# Patient Record
Sex: Female | Born: 1950 | Race: White | Hispanic: No | Marital: Single | Smoking: Former smoker
Health system: Southern US, Community
[De-identification: ages and names within clinical notes are randomized; demographics above are authoritative.]

## PROBLEM LIST (undated history)

## (undated) DIAGNOSIS — F0390 Unspecified dementia without behavioral disturbance: Secondary | ICD-10-CM

## (undated) DIAGNOSIS — R32 Unspecified urinary incontinence: Secondary | ICD-10-CM

## (undated) DIAGNOSIS — E079 Disorder of thyroid, unspecified: Secondary | ICD-10-CM

## (undated) DIAGNOSIS — E785 Hyperlipidemia, unspecified: Secondary | ICD-10-CM

## (undated) HISTORY — PX: ABDOMINAL HYSTERECTOMY: SHX81

## (undated) HISTORY — DX: Disorder of thyroid, unspecified: E07.9

## (undated) HISTORY — DX: Unspecified urinary incontinence: R32

## (undated) HISTORY — DX: Unspecified dementia, unspecified severity, without behavioral disturbance, psychotic disturbance, mood disturbance, and anxiety: F03.90

## (undated) HISTORY — PX: DUPUYTREN CONTRACTURE RELEASE: SHX1478

## (undated) HISTORY — DX: Hyperlipidemia, unspecified: E78.5

---

## 2020-10-25 ENCOUNTER — Other Ambulatory Visit: Payer: Self-pay

## 2020-10-25 ENCOUNTER — Ambulatory Visit (INDEPENDENT_AMBULATORY_CARE_PROVIDER_SITE_OTHER): Payer: Medicare Other | Admitting: Family Medicine

## 2020-10-25 ENCOUNTER — Encounter: Payer: Self-pay | Admitting: Family Medicine

## 2020-10-25 VITALS — BP 130/66 | HR 93 | Temp 98.8°F | Ht 62.5 in | Wt 148.8 lb

## 2020-10-25 DIAGNOSIS — G309 Alzheimer's disease, unspecified: Secondary | ICD-10-CM

## 2020-10-25 DIAGNOSIS — F03918 Unspecified dementia, unspecified severity, with other behavioral disturbance: Secondary | ICD-10-CM | POA: Insufficient documentation

## 2020-10-25 DIAGNOSIS — F0390 Unspecified dementia without behavioral disturbance: Secondary | ICD-10-CM | POA: Insufficient documentation

## 2020-10-25 DIAGNOSIS — F0391 Unspecified dementia with behavioral disturbance: Secondary | ICD-10-CM | POA: Insufficient documentation

## 2020-10-25 DIAGNOSIS — E2839 Other primary ovarian failure: Secondary | ICD-10-CM

## 2020-10-25 DIAGNOSIS — Z1231 Encounter for screening mammogram for malignant neoplasm of breast: Secondary | ICD-10-CM | POA: Diagnosis not present

## 2020-10-25 DIAGNOSIS — N3281 Overactive bladder: Secondary | ICD-10-CM

## 2020-10-25 DIAGNOSIS — E538 Deficiency of other specified B group vitamins: Secondary | ICD-10-CM

## 2020-10-25 DIAGNOSIS — E785 Hyperlipidemia, unspecified: Secondary | ICD-10-CM

## 2020-10-25 DIAGNOSIS — E559 Vitamin D deficiency, unspecified: Secondary | ICD-10-CM

## 2020-10-25 DIAGNOSIS — F0280 Dementia in other diseases classified elsewhere without behavioral disturbance: Secondary | ICD-10-CM | POA: Diagnosis not present

## 2020-10-25 LAB — COMPREHENSIVE METABOLIC PANEL
ALT: 19 U/L (ref 0–35)
AST: 19 U/L (ref 0–37)
Albumin: 4 g/dL (ref 3.5–5.2)
Alkaline Phosphatase: 126 U/L — ABNORMAL HIGH (ref 39–117)
BUN: 10 mg/dL (ref 6–23)
CO2: 30 mEq/L (ref 19–32)
Calcium: 9.6 mg/dL (ref 8.4–10.5)
Chloride: 101 mEq/L (ref 96–112)
Creatinine, Ser: 0.86 mg/dL (ref 0.40–1.20)
GFR: 69.53 mL/min (ref >60.00)
Glucose, Bld: 107 mg/dL — ABNORMAL HIGH (ref 70–99)
Potassium: 3.6 mEq/L (ref 3.5–5.1)
Sodium: 139 mEq/L (ref 135–145)
Total Bilirubin: 0.5 mg/dL (ref 0.2–1.2)
Total Protein: 7.2 g/dL (ref 6.0–8.3)

## 2020-10-25 LAB — CBC WITH DIFFERENTIAL/PLATELET
Basophils Absolute: 0.1 10*3/uL (ref 0.0–0.1)
Basophils Relative: 0.6 % (ref 0.0–3.0)
Eosinophils Absolute: 0.2 10*3/uL (ref 0.0–0.7)
Eosinophils Relative: 2.3 % (ref 0.0–5.0)
HCT: 38.1 % (ref 36.0–46.0)
Hemoglobin: 13 g/dL (ref 12.0–15.0)
Lymphocytes Relative: 21.6 % (ref 12.0–46.0)
Lymphs Abs: 1.9 10*3/uL (ref 0.7–4.0)
MCHC: 34.2 g/dL (ref 30.0–36.0)
MCV: 89.8 fl (ref 78.0–100.0)
Monocytes Absolute: 0.6 10*3/uL (ref 0.1–1.0)
Monocytes Relative: 7 % (ref 3.0–12.0)
Neutro Abs: 6.1 10*3/uL (ref 1.4–7.7)
Neutrophils Relative %: 68.5 % (ref 43.0–77.0)
Platelets: 299 10*3/uL (ref 150.0–400.0)
RBC: 4.25 Mil/uL (ref 3.87–5.11)
RDW: 13.3 % (ref 11.5–15.5)
WBC: 8.9 10*3/uL (ref 4.0–10.5)

## 2020-10-25 LAB — LIPID PANEL
Cholesterol: 141 mg/dL (ref 0–200)
HDL: 42.9 mg/dL (ref >39.00)
LDL Cholesterol: 61 mg/dL (ref 0–99)
NonHDL: 97.68
Total CHOL/HDL Ratio: 3
Triglycerides: 185 mg/dL — ABNORMAL HIGH (ref 0.0–149.0)
VLDL: 37 mg/dL (ref 0.0–40.0)

## 2020-10-25 MED ORDER — TRIAMCINOLONE ACETONIDE 0.5 % EX OINT: 1.0000 "application " | TOPICAL_OINTMENT | Freq: Two times a day (BID) | CUTANEOUS | 0 refills | Status: DC

## 2020-10-25 NOTE — Patient Instructions (Addendum)
I will be in touch with you once I get previous records from [place] (from primary doc there)  *mammogram and bone density were ordered - you will get a call to set these up at  imaging.   *we can talk about sleep once I review records. Sleep can be disturbed with dementia as well as medications being higher risk. We can review options once I have more information.   *we can try triamcinolone for the rash - hopefully this will help take away itch. Let me know how this does. It is hard to tell what the rash is because everything is scratched off. Let me know if increased redness or soreness around areas of rash. Donna Patrick

## 2020-10-25 NOTE — Progress Notes (Signed)
Donna Patrick DOB: [birth year] Encounter date: 10/25/2020  This is a 70 y.o. female who presents to establish care. Chief Complaint  Patient presents with  . Establish Care  . Rash    Patient complains of itchy bumps on back and abdomen, time unknown, tried Hydrocortisone with some relief  . Insomnia    History of present illness: Moving from [place]. Has been there a long time and was living with boyfriend. Dementia was progressing and family moved her here. She is here with daughter today; son and daughter in law are my patients. Known medical history is pretty brief. Daughter here states that they may not have been updated on all med issues.  Frequent urination per daughter. Patient states it was not an issue. Was getting up 15-20 times/night and then started on medication (per son; patient declines). Patient denies incontinence.  Sleep pattern off per son: trouble going to sleep.   Was given benadryl for spots on her back/stomach - not sure if related to medications or not. Benadryl helped, but didn't make it go away. It is a lot better now.   Was seeing specialist in [place] (per daughter, but son states she just saw primary care), not sure name. Daughter thinks she has had memory issues for years but exact timeline is not known.  Last mammogram was not recently per son, DIL. They don't think she had a bone density but would like these things completed.  Rash on back - patient has been scratching with back scratcher and scratching self raw. Rash is better than it was a few weeks ago. Hydrocortisone helps momentarily with itch; not sure really helping. Patient also picks at one spot on face and at ear.    Past Medical History:  Diagnosis Date  . Dementia (HCC)    diagnosed 8-10 years ago  . Hyperlipidemia   . Incontinence   . Thyroid disease    Past Surgical History:  Procedure Laterality Date  . ABDOMINAL HYSTERECTOMY    . DUPUYTREN CONTRACTURE RELEASE Left    No Known  Allergies Current Meds  Medication Sig  . levothyroxine (SYNTHROID) 88 MCG tablet Take 88 mcg by mouth daily before breakfast.  . memantine (NAMENDA) 10 MG tablet Take 10 mg by mouth 2 (two) times daily.  Marland Kitchen oxybutynin (DITROPAN-XL) 10 MG 24 hr tablet Take 10 mg by mouth daily.  . simvastatin (ZOCOR) 40 MG tablet Take 40 mg by mouth daily.  Marland Kitchen triamcinolone ointment (KENALOG) 0.5 % Apply 1 application topically 2 (two) times daily.   Social History   Tobacco Use  . Smoking status: Former Games developer  . Smokeless tobacco: Never Used  Substance Use Topics  . Alcohol use: Never   Family History  Problem Relation Age of Onset  . Dementia Mother   . Cancer Father   . Dementia Brother   . Parkinson's disease Brother      Review of Systems  Constitutional: Negative for chills, fatigue and fever.  Respiratory: Negative for cough, chest tightness, shortness of breath and wheezing.   Cardiovascular: Negative for chest pain, palpitations and leg swelling.  Genitourinary: Negative for difficulty urinating, dysuria and frequency.  Neurological: Negative for dizziness and headaches.  Psychiatric/Behavioral: Positive for confusion. Sleep disturbance: trouble falling asleep. The patient is not nervous/anxious.     Objective:  BP 130/66 (BP Location: Left Arm, Patient Position: Sitting, Cuff Size: Normal)   Pulse 93   Temp 98.8 F (37.1 C) (Oral)   Ht 5' 2.5" (1.588 m)  Wt 148 lb 12.8 oz (67.5 kg)   SpO2 99%   BMI 26.78 kg/m   Weight: 148 lb 12.8 oz (67.5 kg)   BP Readings from Last 3 Encounters:  10/25/20 130/66   Wt Readings from Last 3 Encounters:  10/25/20 148 lb 12.8 oz (67.5 kg)    Physical Exam Constitutional:      General: She is not in acute distress.    Appearance: She is well-developed.  HENT:     Right Ear: Tympanic membrane and ear canal normal.     Left Ear: Tympanic membrane and ear canal normal.  Cardiovascular:     Rate and Rhythm: Normal rate and regular  rhythm.     Heart sounds: Murmur heard.   Systolic murmur is present with a grade of 2/6. No friction rub.  Pulmonary:     Effort: Pulmonary effort is normal. No respiratory distress.     Breath sounds: Normal breath sounds. No wheezing or rales.  Musculoskeletal:     Right lower leg: No edema.     Left lower leg: No edema.  Neurological:     Mental Status: She is alert and oriented to person, place, and time.     Cranial Nerves: Cranial nerves are intact.     Motor: Motor function is intact.     Gait: Gait is intact. Gait normal.  Psychiatric:        Attention and Perception: She is inattentive.        Mood and Affect: Mood normal.        Speech: Speech normal.        Behavior: Behavior normal.        Cognition and Memory: Memory is impaired. She exhibits impaired recent memory and impaired remote memory.     Comments: She does not recall medical history. She is surprised by everything on her med history including dementia, surgical history of hysterectomy, etc. She is not aware of time or place.      Assessment/Plan:  1. Hyperlipidemia, unspecified hyperlipidemia  Continue Zocor 40 mg.  We will recheck blood work today.  Records are being requested.  Daughter is not aware of prior physicians name, so she will take release sheet with her. - Comprehensive metabolic panel; Future - Lipid panel; Future - TSH; Future - TSH - Lipid panel - Comprehensive metabolic panel  2. Overactive bladder Continue with oxybutynin. - Urinalysis with Culture Reflex  3. Alzheimer's dementia without behavioral disturbance, unspecified timing of dementia onset [facility]) Patient is currently on Namenda 10 mg twice daily.  We will check evaluation for dementia med records once received. - CBC with Differential/Platelet; Future - CBC with Differential/Platelet  4. Encounter for screening mammogram for malignant neoplasm of breast Briefly mentioned consideration for stopping screening or limiting  screening as dementia progresses.  Since they are uncertain of last mammogram, I think it is reasonable to order this today. - MM DIGITAL SCREENING BILATERAL; Future  5. Estrogen deficiency - DG Bone Density; Future  6. B12 deficiency - Vitamin B12; Future - Vitamin B12  7. Vitamin D deficiency - VITAMIN D 25 Hydroxy (Vit-D Deficiency, Fractures); Future - VITAMIN D 25 Hydroxy (Vit-D Deficiency, Fractures)  Return for pending bloodwork.  Theodis Shove, MD

## 2020-10-26 LAB — VITAMIN D 25 HYDROXY (VIT D DEFICIENCY, FRACTURES): VITD: 22.1 ng/mL — ABNORMAL LOW (ref 30.00–100.00)

## 2020-10-26 LAB — VITAMIN B12: Vitamin B-12: 185 pg/mL — ABNORMAL LOW (ref 211–911)

## 2020-10-26 LAB — TSH: TSH: 4.22 u[IU]/mL (ref 0.35–4.50)

## 2020-10-27 ENCOUNTER — Other Ambulatory Visit (INDEPENDENT_AMBULATORY_CARE_PROVIDER_SITE_OTHER): Payer: Medicare Other

## 2020-10-27 ENCOUNTER — Telehealth: Payer: Self-pay | Admitting: Family Medicine

## 2020-10-27 DIAGNOSIS — D519 Vitamin B12 deficiency anemia, unspecified: Secondary | ICD-10-CM

## 2020-10-27 LAB — FOLATE: Folate: 4.4 ng/mL — ABNORMAL LOW (ref >5.9)

## 2020-10-27 NOTE — Telephone Encounter (Signed)
Patient daughter is returning the call, please advise. CB is [phone]

## 2020-10-31 NOTE — Telephone Encounter (Signed)
See results note. 

## 2020-11-01 ENCOUNTER — Other Ambulatory Visit: Payer: Self-pay | Admitting: Family Medicine

## 2020-11-01 ENCOUNTER — Telehealth: Payer: Self-pay | Admitting: Family Medicine

## 2020-11-01 DIAGNOSIS — G309 Alzheimer's disease, unspecified: Secondary | ICD-10-CM

## 2020-11-01 DIAGNOSIS — F0281 Dementia in other diseases classified elsewhere with behavioral disturbance: Secondary | ICD-10-CM

## 2020-11-01 MED ORDER — FOLIC ACID 1 MG PO TABS: 1.0000 mg | ORAL_TABLET | Freq: Every day | ORAL | 1 refills | Status: DC

## 2020-11-01 NOTE — Telephone Encounter (Signed)
Pts (daughter) is calling in stating that she is not sleeping and very agitated.  Pt went to bed last night (10/31/2020) 8:00 p.m. and back up at 10:00 p.m. and got back up at 3:22 a.m. and pt still up very agitated and will not eat her breakfast.  Going outside with short sleeves and shorts on pulling up flowers.  Daughter is wanting to have a call back to see if they can get some medication to assist the pt with sleeping.

## 2020-11-01 NOTE — Telephone Encounter (Signed)
Sent to her pharmacy. 

## 2020-11-01 NOTE — Telephone Encounter (Signed)
Spoke with Gabriel Rung and informed her of the message below and she denies any sick symptoms noted in the patient.  Lab appt scheduled for 3/17.

## 2020-11-01 NOTE — Telephone Encounter (Signed)
Noted  

## 2020-11-01 NOTE — Telephone Encounter (Signed)
For some reason urinalysis with culture was not completed when she was here. I would suggest we get that sample first, since sometimes these behaviors can come from infection. Using sedating medications with dementia can cause increased confusion, so we try to avoid these. We can though refer to neurology for the future (I will put in that order). Make sure no other sick symptoms like fever,cough. If she has not been this way before, we have to rule out infection before anything else.

## 2020-11-01 NOTE — Telephone Encounter (Signed)
Pt said she need a Rx for FOLATE 1 MG  . The patient said she could not find the  Folate 1 mg OTC Anywhere   [id number] [place], [place] - [address] AT [facility] [address] [facility] [address]  Phone:  [phone] Fax:  [phone]

## 2020-11-02 ENCOUNTER — Other Ambulatory Visit: Payer: Self-pay

## 2020-11-02 ENCOUNTER — Telehealth: Payer: Self-pay | Admitting: Family Medicine

## 2020-11-02 ENCOUNTER — Other Ambulatory Visit: Payer: Medicare Other

## 2020-11-02 ENCOUNTER — Other Ambulatory Visit: Payer: Self-pay | Admitting: Family Medicine

## 2020-11-02 DIAGNOSIS — F0280 Dementia in other diseases classified elsewhere without behavioral disturbance: Secondary | ICD-10-CM

## 2020-11-02 DIAGNOSIS — N3281 Overactive bladder: Secondary | ICD-10-CM

## 2020-11-02 NOTE — Addendum Note (Signed)
Addended by: Evert Kohl D on: 11/02/2020 02:32 PM   Modules accepted: Orders

## 2020-11-02 NOTE — Telephone Encounter (Signed)
Spoke with [facility] and informed her the pt should be taking the type of B12 that dissolves under the tongue and not the type to be swallowed. Gabriel Rung stated she is aware of this, has given the pt this type and she is having difficulty with the pt keeping the pill under the tongue and then swallow.  Stated she will continue to try this with the pt and call back if needed.

## 2020-11-02 NOTE — Telephone Encounter (Signed)
Patient is wanting to know if she can get a smaller pill for her B12. She states it is very hard for her swallow.  Please advise.

## 2020-11-03 NOTE — Addendum Note (Signed)
Addended by: Evert Kohl D on: 11/03/2020 11:43 AM   Modules accepted: Orders

## 2020-11-05 LAB — URINALYSIS W MICROSCOPIC + REFLEX CULTURE
Bilirubin Urine: NEGATIVE
Glucose, UA: NEGATIVE
Hgb urine dipstick: NEGATIVE
Hyaline Cast: NONE SEEN /LPF
Ketones, ur: NEGATIVE
Nitrites, Initial: NEGATIVE
Specific Gravity, Urine: 1.018 (ref 1.001–1.03)
pH: 6 (ref 5.0–8.0)

## 2020-11-05 LAB — URINE CULTURE
MICRO NUMBER:: 11667604
SPECIMEN QUALITY:: ADEQUATE

## 2020-11-05 LAB — CULTURE INDICATED

## 2020-11-09 ENCOUNTER — Other Ambulatory Visit: Payer: Self-pay

## 2020-11-09 ENCOUNTER — Encounter: Payer: Self-pay | Admitting: Family Medicine

## 2020-11-09 ENCOUNTER — Telehealth: Payer: Self-pay | Admitting: Family Medicine

## 2020-11-09 ENCOUNTER — Ambulatory Visit (INDEPENDENT_AMBULATORY_CARE_PROVIDER_SITE_OTHER): Payer: Medicare Other | Admitting: Family Medicine

## 2020-11-09 VITALS — BP 124/70 | HR 96 | Temp 98.6°F | Wt 146.6 lb

## 2020-11-09 DIAGNOSIS — R0981 Nasal congestion: Secondary | ICD-10-CM

## 2020-11-09 DIAGNOSIS — L03312 Cellulitis of back [any part except buttock]: Secondary | ICD-10-CM | POA: Diagnosis not present

## 2020-11-09 MED ORDER — DOXYCYCLINE HYCLATE 100 MG PO TABS: 100.0000 mg | ORAL_TABLET | Freq: Two times a day (BID) | ORAL | 0 refills | Status: DC

## 2020-11-09 MED ORDER — TRIAMCINOLONE ACETONIDE 0.5 % EX OINT: 1.0000 "application " | TOPICAL_OINTMENT | Freq: Two times a day (BID) | CUTANEOUS | 0 refills | Status: DC

## 2020-11-09 NOTE — Patient Instructions (Signed)

## 2020-11-09 NOTE — Telephone Encounter (Signed)
Patient daughter is calling and requesting a refill for triamcinolone ointment (KENALOG) 0.5 % to be sent to [id number] -  [address] [place], [place] [address]  Phone:  [phone] Fax:  [phone] CB is [phone]

## 2020-11-09 NOTE — Telephone Encounter (Signed)
Patient daughter in law is calling back and stated that patient did want the provider to know that as soon as she left the office patient is complaining of back pain and would like a call back, please advise. CB is [phone]

## 2020-11-09 NOTE — Progress Notes (Signed)
Subjective:    Patient ID: Donna Patrick, female    DOB: November 22, 1950, 70 y.o.   MRN: [id number]  Chief Complaint  Patient presents with  . Rash  Pt accompanied by her daughter.  HPI Patient is a 70 year old female with past medical history significant for dementia, thyroid disease, HLD, overactive bladder who was followed by Dr. Hassan Rowan and seen for acute concern.  Rash on back pruritic with erythema.  Pt with increased confusion and sounding congested when breathing.  Pt's daughter denies sick contacts.  Past Medical History:  Diagnosis Date  . Dementia (HCC)    diagnosed 8-10 years ago  . Hyperlipidemia   . Incontinence   . Thyroid disease     No Known Allergies  ROS per pt's daughter 2/2 pt's h/o dementia General: Denies fever, chills, night sweats, changes in weight, changes in appetite +confusion HEENT: Denies headaches, ear pain, changes in vision, rhinorrhea, sore throat CV: Denies CP, palpitations, SOB, orthopnea Pulm: Denies SOB, cough, wheezing GI: Denies abdominal pain, nausea, vomiting, diarrhea, constipation GU: Denies dysuria, hematuria, frequency, vaginal discharge Msk: Denies muscle cramps, joint pains Neuro: Denies weakness, numbness, tingling Skin: Denies bruising  +rash Psych: Denies depression, anxiety, hallucinations     Objective:    Blood pressure 124/70, pulse 96, temperature 98.6 F (37 C), temperature source Axillary, weight 146 lb 9.6 oz (66.5 kg), SpO2 96 %.  Gen. Pleasant, well-nourished, in no distress, normal affect   HEENT: [place]/AT, face symmetric, conjunctiva clear, no scleral icterus, PERRLA, EOMI, nares patent with dried drainage present, pharynx with postnasal drainage, no erythema or exudate. Lungs: no accessory muscle use, CTAB, no wheezes or rales Cardiovascular: RRR, no m/r/g, no peripheral edema Musculoskeletal: No deformities, no cyanosis or clubbing, normal tone Neuro:  A&Ox1 (person), CN II-XII intact, normal gait Skin:  Warm,  dry, intact.  2 areas of erythema and induration on lower right back with central punctum, no drainage expressed, slightly warm and temperature.  Areas marked with skin marker.  Well-healed/healing hyperpigmented areas along lower back.      Wt Readings from Last 3 Encounters:  11/09/20 146 lb 9.6 oz (66.5 kg)  10/25/20 148 lb 12.8 oz (67.5 kg)    Lab Results  Component Value Date   WBC 8.9 10/25/2020   HGB 13.0 10/25/2020   HCT 38.1 10/25/2020   PLT 299.0 10/25/2020   GLUCOSE 107 (H) 10/25/2020   CHOL 141 10/25/2020   TRIG 185.0 (H) 10/25/2020   HDL 42.90 10/25/2020   LDLCALC 61 10/25/2020   ALT 19 10/25/2020   AST 19 10/25/2020   NA 139 10/25/2020   K 3.6 10/25/2020   CL 101 10/25/2020   CREATININE 0.86 10/25/2020   BUN 10 10/25/2020   CO2 30 10/25/2020   TSH 4.22 10/25/2020    Assessment/Plan:  Cellulitis of back -Areas of concern on back demarcated with skin marker -Given handout -For continued or worsening symptoms may benefit from I&D -Given strict precautions - Plan: doxycycline (VIBRA-TABS) 100 MG tablet  Nasal congestion -Discussed supportive care such as nasal saline or Allegra po -Follow-up as needed  F/u as needed  Abbe Amsterdam, MD

## 2020-11-09 NOTE — Telephone Encounter (Signed)
Rx done. 

## 2020-11-10 ENCOUNTER — Ambulatory Visit (HOSPITAL_COMMUNITY)
Admission: EM | Admit: 2020-11-10 | Discharge: 2020-11-10 | Disposition: A | Discharge status: Home / Self Care | Payer: Medicare Other | Attending: Student | Admitting: Student

## 2020-11-10 ENCOUNTER — Encounter (HOSPITAL_COMMUNITY): Payer: Self-pay | Admitting: Emergency Medicine

## 2020-11-10 ENCOUNTER — Other Ambulatory Visit: Payer: Self-pay

## 2020-11-10 DIAGNOSIS — N3281 Overactive bladder: Secondary | ICD-10-CM | POA: Insufficient documentation

## 2020-11-10 DIAGNOSIS — T148XXA Other injury of unspecified body region, initial encounter: Secondary | ICD-10-CM | POA: Diagnosis not present

## 2020-11-10 DIAGNOSIS — R6883 Chills (without fever): Secondary | ICD-10-CM | POA: Diagnosis not present

## 2020-11-10 DIAGNOSIS — R339 Retention of urine, unspecified: Secondary | ICD-10-CM

## 2020-11-10 DIAGNOSIS — F0390 Unspecified dementia without behavioral disturbance: Secondary | ICD-10-CM | POA: Diagnosis present

## 2020-11-10 DIAGNOSIS — L03312 Cellulitis of back [any part except buttock]: Secondary | ICD-10-CM | POA: Diagnosis present

## 2020-11-10 DIAGNOSIS — N3001 Acute cystitis with hematuria: Secondary | ICD-10-CM | POA: Insufficient documentation

## 2020-11-10 LAB — POCT URINALYSIS DIPSTICK, ED / UC
Bilirubin Urine: NEGATIVE
Glucose, UA: NEGATIVE mg/dL
Ketones, ur: NEGATIVE mg/dL
Nitrite: NEGATIVE
Protein, ur: NEGATIVE mg/dL
Specific Gravity, Urine: 1.015 (ref 1.005–1.030)
Urobilinogen, UA: 0.2 mg/dL (ref 0.0–1.0)
pH: 6 (ref 5.0–8.0)

## 2020-11-10 MED ORDER — CEPHALEXIN 500 MG PO CAPS: 500.0000 mg | ORAL_CAPSULE | Freq: Four times a day (QID) | ORAL | 0 refills | Status: DC

## 2020-11-10 NOTE — ED Provider Notes (Signed)
[facility]    CSN: [id number] Arrival date & time: 11/10/20  1634      History   Chief Complaint Chief Complaint  Patient presents with  . Blister  . Urinary Retention  . Chills    HPI Donna Patrick is a 70 y.o. female presenting with urinary symptoms and rash.  History dementia, hyperlipidemia, OAB, thyroid disease. Here today with daughter. Chart review shows history of incontinence but patient and daughter deny this. Notes chills, decreased urination for about 1 week. States she has been drinking less water lately. Subjective chills. Denies other urinary symptoms. Denies hematuria, dysuria, frequency, urgency, back pain, n/v/d/abd pain, fevers, abdnormal vaginal discharge.    Also with red painful rash on lower back for about 1 week (patient is poor historian and daughter is unsure of timeframe). States this is getting bigger and more painful. Denies itching, discharge, rashes elsewhere.  Endorses subjective chills. they state that primary care diagnosed this as cellulitis 1 day ago and prescribed doxycycline and triamcinolone ointment, which they have not started.   HPI  Past Medical History:  Diagnosis Date  . Dementia (HCC)    diagnosed 8-10 years ago  . Hyperlipidemia   . Incontinence   . Thyroid disease     Patient Active Problem List   Diagnosis Date Noted  . Hyperlipidemia 10/25/2020  . Overactive bladder 10/25/2020  . Dementia (HCC) 10/25/2020    Past Surgical History:  Procedure Laterality Date  . ABDOMINAL HYSTERECTOMY    . DUPUYTREN CONTRACTURE RELEASE Left     OB History   No obstetric history on file.      Home Medications    Prior to Admission medications   Medication Sig Start Date End Date Taking? Authorizing Provider  cephALEXin (KEFLEX) 500 MG capsule Take 1 capsule (500 mg total) by mouth 4 (four) times daily. 11/10/20  Yes Rhys Martini, PA-C  doxycycline (VIBRA-TABS) 100 MG tablet Take 1 tablet (100 mg total) by mouth 2  (two) times daily. 11/09/20   Deeann Saint, MD  folic acid (FOLVITE) 1 MG tablet Take 1 tablet (1 mg total) by mouth daily. 11/01/20   Wynn Banker, MD  levothyroxine (SYNTHROID) 88 MCG tablet Take 88 mcg by mouth daily before breakfast.    [provider]  memantine (NAMENDA) 10 MG tablet Take 10 mg by mouth 2 (two) times daily.    [provider]  oxybutynin (DITROPAN-XL) 10 MG 24 hr tablet Take 10 mg by mouth daily.    [provider]  simvastatin (ZOCOR) 40 MG tablet Take 40 mg by mouth daily.    [provider]  triamcinolone ointment (KENALOG) 0.5 % Apply 1 application topically 2 (two) times daily. 11/09/20   Wynn Banker, MD    Family History Family History  Problem Relation Age of Onset  . Dementia Mother   . Cancer Father   . Dementia Brother   . Parkinson's disease Brother     Social History Social History   Tobacco Use  . Smoking status: Former Games developer  . Smokeless tobacco: Never Used  Vaping Use  . Vaping Use: Never used  Substance Use Topics  . Alcohol use: Never  . Drug use: Never     Allergies   Patient has no known allergies.   Review of Systems Review of Systems  Constitutional: Negative for appetite change, chills, diaphoresis and fever.  Respiratory: Negative for shortness of breath.   Cardiovascular: Negative for chest pain.  Gastrointestinal: Negative for abdominal pain, blood in stool, constipation, diarrhea, nausea and vomiting.  Genitourinary: Positive for difficulty urinating. Negative for decreased urine volume, dyspareunia, dysuria, enuresis, flank pain, frequency, genital sores, hematuria, menstrual problem, pelvic pain, urgency, vaginal bleeding, vaginal discharge and vaginal pain.  Musculoskeletal: Negative for back pain.  Skin: Positive for rash.  Neurological: Negative for dizziness, weakness and light-headedness.  All other systems reviewed and are negative.    Physical Exam Triage  Vital Signs ED Triage Vitals  Enc Vitals Group     BP 11/10/20 1746 (!) 154/85     Pulse Rate 11/10/20 1746 89     Resp 11/10/20 1746 16     Temp 11/10/20 1746 98.7 F (37.1 C)     Temp Source 11/10/20 1746 Oral     SpO2 11/10/20 1746 98 %     Weight --      Height --      Head Circumference --      Peak Flow --      Pain Score 11/10/20 1744 7     Pain Loc --      Pain Edu? --      Excl. in GC? --    No data found.  Updated Vital Signs BP (!) 154/85 (BP Location: Right Arm)   Pulse 89   Temp 98.7 F (37.1 C) (Oral)   Resp 16   SpO2 98%   Visual Acuity Right Eye Distance:   Left Eye Distance:   Bilateral Distance:    Right Eye Near:   Left Eye Near:    Bilateral Near:     Physical Exam Vitals reviewed.  Constitutional:      General: She is not in acute distress.    Appearance: Normal appearance. She is not ill-appearing.  HENT:     Head: Normocephalic and atraumatic.     Mouth/Throat:     Mouth: Mucous membranes are moist.  Cardiovascular:     Rate and Rhythm: Normal rate and regular rhythm.     Heart sounds: Normal heart sounds.  Pulmonary:     Effort: Pulmonary effort is normal.     Breath sounds: Normal breath sounds.  Abdominal:     General: Bowel sounds are normal. There is no distension.     Palpations: Abdomen is soft. There is no mass.     Tenderness: There is no abdominal tenderness. There is no right CVA tenderness, left CVA tenderness, guarding or rebound.  Skin:    General: Skin is warm.     Capillary Refill: Capillary refill takes less than 2 seconds.     Comments: R back with 5x3cm area of erythema and warmth, and smaller 1x2cm area of erythema and warmth. Tender to palpation. No discharge.   Neurological:     General: No focal deficit present.     Mental Status: She is alert and oriented to person, place, and time. Mental status is at baseline.  Psychiatric:        Mood and Affect: Mood normal.        Behavior: Behavior normal.         Thought Content: Thought content normal.        Judgment: Judgment normal.      UC Treatments / Results  Labs (all labs ordered are listed, but only abnormal results are displayed) Labs Reviewed  POCT URINALYSIS DIPSTICK, ED / UC - Abnormal; Notable for the following components:      Result Value   Hgb  urine dipstick TRACE (*)    Leukocytes,Ua SMALL (*)    All other components within normal limits  URINE CULTURE    EKG   Radiology No results found.  Procedures Procedures (including critical care time)  Medications Ordered in UC Medications - No data to display  Initial Impression / Assessment and Plan / UC Course  I have reviewed the triage vital signs and the nursing notes.  Pertinent labs & imaging results that were available during my care of the patient were reviewed by me and considered in my medical decision making (see chart for details).     This patient is a 71 year old female presenting with UTI and cellulitis. Today this pt is afebrile nontachycardic nontachypneic, oxygenating well on room air, no wheezes rhonchi or rales. Daughter provides much of history today as patient has dementia. Relevant history of OAB.  UA with trace blood, trace leuk. Culture sent.   Plan to treat for UTI with Keflex and good hydration.  Continue the doxycycline and triamcinolone ointment that primary care sent.  Precautions discussed.  This chart was dictated using voice recognition software, Dragon. Despite the best efforts of this provider to proofread and correct errors, errors may still occur which can change documentation meaning.   Final Clinical Impressions(s) / UC Diagnoses   Final diagnoses:  Acute cystitis with hematuria  Cellulitis of back except buttock  Dementia without behavioral disturbance, unspecified dementia type (HCC)  Overactive bladder     Discharge Instructions     -Start the new antibiotic-Keflex (cephalexin), 4 times daily. -Make sure to  drink plenty of fluids and noncaffeinated tea is fine too. -For your skin infection, start the doxycycline, 2 pills a day.  Also use the triamcinolone ointment that they sent for further relief. -Seek additional medical attention if your symptoms get worse instead of better, like fever/chills despite treatment, worsening of rash despite treatment, worsening urinary symptoms despite treatment, chest pain, dizziness, shortness of breath.    ED Prescriptions    Medication Sig Dispense Auth. Provider   cephALEXin (KEFLEX) 500 MG capsule Take 1 capsule (500 mg total) by mouth 4 (four) times daily. 20 capsule Rhys Martini, PA-C     PDMP not reviewed this encounter.   Rhys Martini, PA-C 11/10/20 [place]

## 2020-11-10 NOTE — ED Triage Notes (Signed)
Pt presents with blisters on lower back, chills, and urinary retention.

## 2020-11-10 NOTE — Telephone Encounter (Signed)
noted 

## 2020-11-10 NOTE — Telephone Encounter (Signed)
Pt daughter in law called and stated that the pt has not gone to the bathroom but one time today and normally goes about 7-8 times by this time of day and pt drinks about a gallon of tea a day.  Daughter in law was advised what Dr. Salomon Fick has stated and then she wanted to speak with someone so I transferred her to triage.

## 2020-11-10 NOTE — Telephone Encounter (Addendum)
Patient daughter in law is calling back and stated that patient was seen yesterday and the cellulitis in her back and has spread to other areas . Pt daughter law is requesting a call back, please advise. CB is [phone]

## 2020-11-10 NOTE — Telephone Encounter (Signed)
Symptoms have increased or become worse patient should be evaluated in person either in clinic, UC, or ED.

## 2020-11-10 NOTE — Discharge Instructions (Addendum)
-  Start the new antibiotic-Keflex (cephalexin), 4 times daily. -Make sure to drink plenty of fluids and noncaffeinated tea is fine too. -For your skin infection, start the doxycycline, 2 pills a day.  Also use the triamcinolone ointment that they sent for further relief. -Seek additional medical attention if your symptoms get worse instead of better, like fever/chills despite treatment, worsening of rash despite treatment, worsening urinary symptoms despite treatment, chest pain, dizziness, shortness of breath.

## 2020-11-12 LAB — URINE CULTURE: Culture: 70000 — AB

## 2020-11-13 ENCOUNTER — Encounter: Payer: Self-pay | Admitting: Family Medicine

## 2020-11-13 ENCOUNTER — Ambulatory Visit (INDEPENDENT_AMBULATORY_CARE_PROVIDER_SITE_OTHER): Payer: Medicare Other | Admitting: Family Medicine

## 2020-11-13 ENCOUNTER — Other Ambulatory Visit: Payer: Self-pay

## 2020-11-13 ENCOUNTER — Telehealth: Payer: Self-pay | Admitting: Family Medicine

## 2020-11-13 VITALS — BP 138/84 | HR 86 | Temp 98.4°F

## 2020-11-13 DIAGNOSIS — N3001 Acute cystitis with hematuria: Secondary | ICD-10-CM

## 2020-11-13 DIAGNOSIS — L03312 Cellulitis of back [any part except buttock]: Secondary | ICD-10-CM

## 2020-11-13 DIAGNOSIS — L0291 Cutaneous abscess, unspecified: Secondary | ICD-10-CM | POA: Diagnosis not present

## 2020-11-13 NOTE — Progress Notes (Signed)
Subjective:    Patient ID: Donna Patrick, female    DOB: 04-26-1951, 70 y.o.   MRN: [id number]  Chief Complaint  Patient presents with  . Follow-up  Pt accompanied by her daughter.  HPI Patient is a 70 yo female with pmh sig for dementia, HLD, and incontinence who is followed by Dr. Hassan Rowan and seen today for f/u.  Pt seen on 11/09/20 by this provider.  Given rx for doxycycline for cellulitis.  Pt seen in ED on 11/10/20 for same, also dx'd with a UTI.  At the time of ED visit patient had not started the doxycycline.  Patient now taking doxycycline and Keflex.  Patient's daughter denies patient having diarrhea, fever, chills, nausea.  Patient came in today as her of her daughter became concerned when the area on her back started draining.  Past Medical History:  Diagnosis Date  . Dementia (HCC)    diagnosed 8-10 years ago  . Hyperlipidemia   . Incontinence   . Thyroid disease     No Known Allergies  ROS patient has dementia unable to obtain   + cellulitis of back with drainage      Objective:    Blood pressure 138/84, pulse 86, temperature 98.4 F (36.9 C), temperature source Oral, SpO2 95 %.  Gen. Pleasant, well-nourished, in no distress, normal affect   HEENT: [place]/AT, face symmetric, conjunctiva clear, no scleral icterus, PERRLA, EOMI, nares patent without drainage Lungs: no accessory muscle use Cardiovascular: RRR, no peripheral edema Neuro:  A&Ox1 (person), CN II-XII intact, normal gait Skin:  Warm, dry.  Posterior right side of low back with large erythematous area with a saturated Band-Aid in place and smaller erythematous area superior, both within the demarcated skin marker line with mild erythema.  Lower area draining purulent fluid.  Induration noted in the lateral edge of larger erythematous area.  Copious amounts of drainage expressed.  Area dressed with Telfa and paper tape.   Wt Readings from Last 3 Encounters:  11/09/20 146 lb 9.6 oz (66.5 kg)  10/25/20 148 lb  12.8 oz (67.5 kg)    Lab Results  Component Value Date   WBC 8.9 10/25/2020   HGB 13.0 10/25/2020   HCT 38.1 10/25/2020   PLT 299.0 10/25/2020   GLUCOSE 107 (H) 10/25/2020   CHOL 141 10/25/2020   TRIG 185.0 (H) 10/25/2020   HDL 42.90 10/25/2020   LDLCALC 61 10/25/2020   ALT 19 10/25/2020   AST 19 10/25/2020   NA 139 10/25/2020   K 3.6 10/25/2020   CL 101 10/25/2020   CREATININE 0.86 10/25/2020   BUN 10 10/25/2020   CO2 30 10/25/2020   TSH 4.22 10/25/2020    Assessment/Plan:  Abscess -Draining -Discussed applying warm compresses to area to promote drainage -Area dressed while in clinic -Continue doxycycline  Cellulitis of back -Continue doxycycline -Continue to monitor for signs of increased or worsening infection  Acute cystitis with hematuria -Continue Keflex -P.o. hydration with water encouraged  F/u as needed with PCP  Abbe Amsterdam, MD

## 2020-11-13 NOTE — Telephone Encounter (Signed)
Antinia Krysteena Stalker called to see what was the name of the sleep medication for the patient to start taking.  Please advise

## 2020-11-13 NOTE — Telephone Encounter (Signed)
Patient went to ED.  Had office visit today with Dr. Salomon Fick for open sore on back.

## 2020-11-14 NOTE — Telephone Encounter (Signed)
Spoke with the pts daughter in law and informed her of the message below per results note from 3/25 in which PCP recommended sustained release Melatonin up to 10mg .

## 2020-11-27 ENCOUNTER — Other Ambulatory Visit: Payer: Self-pay | Admitting: Family Medicine

## 2020-11-27 ENCOUNTER — Telehealth: Payer: Self-pay | Admitting: Family Medicine

## 2020-11-27 MED ORDER — TRIAMCINOLONE ACETONIDE 0.5 % EX OINT: 1.0000 "application " | TOPICAL_OINTMENT | Freq: Two times a day (BID) | CUTANEOUS | 0 refills | Status: DC

## 2020-11-27 NOTE — Telephone Encounter (Signed)
done

## 2020-11-27 NOTE — Telephone Encounter (Signed)
Patient daughter is calling and requesting a refill for triamcinolone ointment (KENALOG) 0.5 % to be sent to  [id number] G[place], [place] - [address] AT [facility] [address] [facility] [address]   Phone:  [phone] Fax:  [phone] CB is [phone]

## 2020-12-04 ENCOUNTER — Telehealth: Payer: Self-pay | Admitting: Family Medicine

## 2020-12-04 NOTE — Telephone Encounter (Signed)
Daughter is calling and and wanted to see if provider would refill her simvastatin (ZOCOR) 40 MG tablet and if it could go to  [facility] [address] [place]  [address], [place] [address]  Phone:  [phone] Fax:  [phone] CB is [phone]

## 2020-12-05 ENCOUNTER — Other Ambulatory Visit: Payer: Self-pay | Admitting: Internal Medicine

## 2020-12-05 DIAGNOSIS — E785 Hyperlipidemia, unspecified: Secondary | ICD-10-CM

## 2020-12-05 MED ORDER — SIMVASTATIN 40 MG PO TABS: 40.0000 mg | ORAL_TABLET | Freq: Every day | ORAL | 1 refills | Status: DC

## 2020-12-05 NOTE — Telephone Encounter (Signed)
Noted  

## 2020-12-15 ENCOUNTER — Encounter: Payer: Self-pay | Admitting: Family Medicine

## 2020-12-15 ENCOUNTER — Telehealth (INDEPENDENT_AMBULATORY_CARE_PROVIDER_SITE_OTHER): Payer: Medicare Other | Admitting: Family Medicine

## 2020-12-15 DIAGNOSIS — E538 Deficiency of other specified B group vitamins: Secondary | ICD-10-CM | POA: Diagnosis not present

## 2020-12-15 DIAGNOSIS — E039 Hypothyroidism, unspecified: Secondary | ICD-10-CM | POA: Diagnosis not present

## 2020-12-15 DIAGNOSIS — F424 Excoriation (skin-picking) disorder: Secondary | ICD-10-CM

## 2020-12-15 DIAGNOSIS — L602 Onychogryphosis: Secondary | ICD-10-CM

## 2020-12-15 DIAGNOSIS — F0280 Dementia in other diseases classified elsewhere without behavioral disturbance: Secondary | ICD-10-CM

## 2020-12-15 DIAGNOSIS — G309 Alzheimer's disease, unspecified: Secondary | ICD-10-CM | POA: Diagnosis not present

## 2020-12-15 MED ORDER — DONEPEZIL HCL 5 MG PO TABS
5.0000 mg | ORAL_TABLET | Freq: Every day | ORAL | 1 refills | Status: DC
Start: 2020-12-15 — End: 2021-06-04

## 2020-12-15 MED ORDER — LEVOTHYROXINE SODIUM 88 MCG PO TABS: 88.0000 ug | ORAL_TABLET | Freq: Every day | ORAL | 1 refills | Status: DC

## 2020-12-15 NOTE — Progress Notes (Signed)
Virtual Visit via Video Note  I connected with Donna Patrick  on 12/15/20 at  2:30 PM EDT by a video enabled telemedicine application and verified that I am speaking with the correct person using two identifiers.  Location patient: home Location provider: [facility]  [address] [place], [place] [address] Persons participating in the virtual visit: patient, provider  I discussed the limitations of evaluation and management by telemedicine and the availability of in person appointments. The patient expressed understanding and agreed to proceed.   MARTIE MUHLBAUER DOB: 09/04/1950 Encounter date: 12/15/2020  This is a 71 y.o. female who presents with Chief Complaint  Patient presents with  . Follow-up    Patients daughter-in-law states she is concerned and does not feel Candiss Norse is helping as the patient is still confused and agitated, for example she is confused about her shoes and attempts to shower with clothes on  . Medication Refill    Requests refills on Levothyroxine and Simvastatin    History of present illness: Daughter states that she feels medicine for dementia is not helping. She is all over the place - used to just be more moments of episodes (like just wearing one shoe); now it will be all day.   Still picking on skin. Won't stop - just keeps on picking. Legs, belly. Back looks good now. Picking skin under nose.   Last colonoscopy was in 2010.   Daughter in law doesn't think that she had imaging/evaluation for dementia.   Has good days with mood, but can be really snappy. Gets frustrated. Has crying spells.   Sleep Is good now. Just taking melatonin - timed release and working well for her.   Appetite is good.   No Known Allergies Current Meds  Medication Sig  . cholecalciferol (VITAMIN D3) 25 MCG (1000 UNIT) tablet Take 2,000 Units by mouth daily.  . Cyanocobalamin 5000 MCG SUBL Place under the tongue daily.  Marland Kitchen donepezil (ARICEPT) 5 MG tablet  Take 1 tablet (5 mg total) by mouth at bedtime.  . folic acid (FOLVITE) 1 MG tablet Take 1 tablet (1 mg total) by mouth daily.  Marland Kitchen MELATONIN PO Take 10 mg by mouth at bedtime.  . memantine (NAMENDA) 10 MG tablet Take 10 mg by mouth 2 (two) times daily.  Marland Kitchen oxybutynin (DITROPAN-XL) 10 MG 24 hr tablet Take 10 mg by mouth daily.  . simvastatin (ZOCOR) 40 MG tablet Take 1 tablet (40 mg total) by mouth daily.  Marland Kitchen triamcinolone ointment (KENALOG) 0.5 % Apply 1 application topically 2 (two) times daily.  . [DISCONTINUED] levothyroxine (SYNTHROID) 88 MCG tablet Take 88 mcg by mouth daily before breakfast.    Review of Systems  Constitutional: Negative for chills, fatigue and fever.  Respiratory: Negative for cough, chest tightness, shortness of breath and wheezing.   Cardiovascular: Negative for chest pain, palpitations and leg swelling.  Genitourinary: Negative for difficulty urinating and dysuria.       No issues with incontinence  Skin:       Skin picking   Neurological: Negative for dizziness, light-headedness and headaches.  Psychiatric/Behavioral: Positive for agitation (sometimes; not frequently), confusion and decreased concentration. Negative for hallucinations and sleep disturbance. The patient is not nervous/anxious (doesn't feel anxious; but skin picking is ongoing issue).     Objective:  There were no vitals taken for this visit.      BP Readings from Last 3 Encounters:  11/13/20 138/84  11/10/20 (!) 154/85  11/09/20 124/70  Wt Readings from Last 3 Encounters:  11/09/20 146 lb 9.6 oz (66.5 kg)  10/25/20 148 lb 12.8 oz (67.5 kg)    EXAM:  GENERAL: alert, appears well and in no distress. She is very comfortable, happy around caregiver who is with her on call.  HEENT: atraumatic, conjunctiva clear, no obvious abnormalities on inspection of external nose and ears  NECK: normal movements of the head and neck  LUNGS: on inspection no signs of respiratory distress, breathing  rate appears normal, no obvious gross SOB, gasping or wheezing  CV: no obvious cyanosis  MS: moves all visible extremities without noticeable abnormality  PSYCH/NEURO: pleasant but confused about topics in conversation, med history, etc.   Oriented to person, place  Assessment/Plan 1. Alzheimer's dementia without behavioral disturbance, unspecified timing of dementia onset [facility]) May need additional evaluation (consider imaging? But patient won't stay still); will send to neuro for their evaluation. I do not have prior records, which would be helpful to see evaluation early in diagnosis. - Ambulatory referral to Neurology - donepezil (ARICEPT) 5 MG tablet; Take 1 tablet (5 mg total) by mouth at bedtime.  Dispense: 90 tablet; Refill: 1  2. Long toenail - Ambulatory referral to Podiatry  3. Hypothyroidism, unspecified type Has been stable. - levothyroxine (SYNTHROID) 88 MCG tablet; Take 1 tablet (88 mcg total) by mouth daily before breakfast.  Dispense: 90 tablet; Refill: 1  4. B12 deficiency Continue with oral supplementation. She is eating well now and we will plan to recheck labs in June and may be able to stop supplementation. When living in [place] she was not eating properly.  5. Skin picking habit Consider ssri after update on starting aricept in 2 weeks time.     I discussed the assessment and treatment plan with the patient. The patient was provided an opportunity to ask questions and all were answered. The patient agreed with the plan and demonstrated an understanding of the instructions.   The patient was advised to call back or seek an in-person evaluation if the symptoms worsen or if the condition fails to improve as anticipated.  I provided 30 minutes of non-face-to-face time during this encounter.   Theodis Shove, MD

## 2020-12-21 ENCOUNTER — Telehealth: Payer: Self-pay | Admitting: *Deleted

## 2020-12-21 ENCOUNTER — Other Ambulatory Visit: Payer: Self-pay

## 2020-12-21 ENCOUNTER — Ambulatory Visit (INDEPENDENT_AMBULATORY_CARE_PROVIDER_SITE_OTHER): Payer: Medicare Other | Admitting: Podiatry

## 2020-12-21 DIAGNOSIS — M79675 Pain in left toe(s): Secondary | ICD-10-CM | POA: Diagnosis not present

## 2020-12-21 DIAGNOSIS — B351 Tinea unguium: Secondary | ICD-10-CM | POA: Diagnosis not present

## 2020-12-21 DIAGNOSIS — M79674 Pain in right toe(s): Secondary | ICD-10-CM

## 2020-12-21 NOTE — Telephone Encounter (Signed)
Appt scheduled as below.

## 2020-12-21 NOTE — Telephone Encounter (Signed)
-----   Message from Wynn Banker, MD sent at 12/15/2020  3:16 PM EDT ----- Please set up 1 mo followup. Virtual is ok.

## 2020-12-25 ENCOUNTER — Other Ambulatory Visit: Payer: Self-pay | Admitting: Family Medicine

## 2020-12-25 ENCOUNTER — Telehealth: Payer: Self-pay | Admitting: Family Medicine

## 2020-12-25 MED ORDER — OXYBUTYNIN CHLORIDE ER 10 MG PO TB24: 10.0000 mg | ORAL_TABLET | Freq: Every day | ORAL | 1 refills | Status: DC

## 2020-12-25 NOTE — Telephone Encounter (Signed)
Noted  

## 2020-12-25 NOTE — Telephone Encounter (Signed)
Patient daughter is calling and wanted to see if provider could refill oxybutynin (DITROPAN-XL) 10 MG 24 hr tablet to be sent to   [facility] [address], [place] -  [address], [place] [address]  Phone:  [phone] Fax:  [phone] CB is [phone]

## 2020-12-25 NOTE — Telephone Encounter (Signed)
done

## 2020-12-26 NOTE — Progress Notes (Signed)
Subjective:   Patient ID: Donna Patrick, female   DOB: 70 y.o.   MRN: [id number]   HPI 70 year old female with history of dementia presents the office today for concerns of thick, elongated toenails that she cannot trim her self.  No open lesions that she reports.  No other concerns.   Review of Systems  All other systems reviewed and are negative.  Past Medical History:  Diagnosis Date  . Dementia (HCC)    diagnosed 8-10 years ago  . Hyperlipidemia   . Incontinence   . Thyroid disease     Past Surgical History:  Procedure Laterality Date  . ABDOMINAL HYSTERECTOMY    . DUPUYTREN CONTRACTURE RELEASE Left      Current Outpatient Medications:  .  cholecalciferol (VITAMIN D3) 25 MCG (1000 UNIT) tablet, Take 2,000 Units by mouth daily., Disp: , Rfl:  .  Cyanocobalamin 5000 MCG SUBL, Place under the tongue daily., Disp: , Rfl:  .  donepezil (ARICEPT) 5 MG tablet, Take 1 tablet (5 mg total) by mouth at bedtime., Disp: 90 tablet, Rfl: 1 .  folic acid (FOLVITE) 1 MG tablet, Take 1 tablet (1 mg total) by mouth daily., Disp: 90 tablet, Rfl: 1 .  levothyroxine (SYNTHROID) 88 MCG tablet, Take 1 tablet (88 mcg total) by mouth daily before breakfast., Disp: 90 tablet, Rfl: 1 .  MELATONIN PO, Take 10 mg by mouth at bedtime., Disp: , Rfl:  .  memantine (NAMENDA) 10 MG tablet, Take 10 mg by mouth 2 (two) times daily., Disp: , Rfl:  .  oxybutynin (DITROPAN-XL) 10 MG 24 hr tablet, Take 1 tablet (10 mg total) by mouth daily., Disp: 90 tablet, Rfl: 1 .  simvastatin (ZOCOR) 40 MG tablet, Take 1 tablet (40 mg total) by mouth daily., Disp: 90 tablet, Rfl: 1 .  triamcinolone ointment (KENALOG) 0.5 %, Apply 1 application topically 2 (two) times daily., Disp: 30 g, Rfl: 0  No Known Allergies        Objective:  Physical Exam  General:  NAD  Dermatological: Nails are hypertrophic, dystrophic, brittle, discolored, elongated 10. No surrounding redness or drainage. Tenderness nails 1-5 bilaterally.  No open lesions or pre-ulcerative lesions are identified today.  Vascular: Dorsalis Pedis artery and Posterior Tibial artery pedal pulses are 2/4 bilateral with immedate capillary fill time. There is no pain with calf compression, swelling, warmth, erythema.   Neruologic: Grossly intact via light touch bilateral.   Musculoskeletal: No other areas of tenderness identified.      Assessment:   Symptomatic onychomycosis     Plan:  -Treatment options discussed including all alternatives, risks, and complications -Etiology of symptoms were discussed -Nails debrided 10 without complications or bleeding. -Daily foot inspection -Follow-up in 3 months or sooner if any problems arise. In the meantime, encouraged to call the office with any questions, concerns, change in symptoms.   Ovid Curd, DPM

## 2021-01-02 ENCOUNTER — Other Ambulatory Visit: Payer: Self-pay

## 2021-01-02 ENCOUNTER — Ambulatory Visit (INDEPENDENT_AMBULATORY_CARE_PROVIDER_SITE_OTHER): Payer: Medicare Other

## 2021-01-02 DIAGNOSIS — Z0000 Encounter for general adult medical examination without abnormal findings: Secondary | ICD-10-CM

## 2021-01-02 NOTE — Patient Instructions (Addendum)
Donna Patrick , Thank you for taking time to come for your Medicare Wellness Visit. I appreciate your ongoing commitment to your health goals. Please review the following plan we discussed and let me know if I can assist you in the future.   Screening recommendations/referrals: Colonoscopy: Per Md advice  Mammogram: Done 10/11/20 Bone Density: Scheduled for 04/27/21 Recommended yearly ophthalmology/optometry visit for glaucoma screening and checkup Recommended yearly dental visit for hygiene and checkup  Vaccinations: Influenza vaccine: Due and discussed Pneumococcal vaccine: Due and discussed Tdap vaccine: Due and discussed Shingles vaccine: Shingrix discussed. Please contact your pharmacy for coverage information.    Covid-19:Completed 4/1, 5/1, & 06/19/21  Advanced directives: Advance directive discussed with you today. I have provided a copy for you to complete at home and have notarized. Once this is complete please bring a copy in to our office so we can scan it into your chart.   Conditions/risks identified: none at this time  Next appointment: Follow up in one year for your annual wellness visit    Preventive Care 65 Years and Older, Female Preventive care refers to lifestyle choices and visits with your health care provider that can promote health and wellness. What does preventive care include?  A yearly physical exam. This is also called an annual well check.  Dental exams once or twice a year.  Routine eye exams. Ask your health care provider how often you should have your eyes checked.  Personal lifestyle choices, including:  Daily care of your teeth and gums.  Regular physical activity.  Eating a healthy diet.  Avoiding tobacco and drug use.  Limiting alcohol use.  Practicing safe sex.  Taking low-dose aspirin every day.  Taking vitamin and mineral supplements as recommended by your health care provider. What happens during an annual well check? The  services and screenings done by your health care provider during your annual well check will depend on your age, overall health, lifestyle risk factors, and family history of disease. Counseling  Your health care provider may ask you questions about your:  Alcohol use.  Tobacco use.  Drug use.  Emotional well-being.  Home and relationship well-being.  Sexual activity.  Eating habits.  History of falls.  Memory and ability to understand (cognition).  Work and work Astronomer.  Reproductive health. Screening  You may have the following tests or measurements:  Height, weight, and BMI.  Blood pressure.  Lipid and cholesterol levels. These may be checked every 5 years, or more frequently if you are over 64 years old.  Skin check.  Lung cancer screening. You may have this screening every year starting at age 64 if you have a 30-pack-year history of smoking and currently smoke or have quit within the past 15 years.  Fecal occult blood test (FOBT) of the stool. You may have this test every year starting at age 87.  Flexible sigmoidoscopy or colonoscopy. You may have a sigmoidoscopy every 5 years or a colonoscopy every 10 years starting at age 44.  Hepatitis C blood test.  Hepatitis B blood test.  Sexually transmitted disease (STD) testing.  Diabetes screening. This is done by checking your blood sugar (glucose) after you have not eaten for a while (fasting). You may have this done every 1-3 years.  Bone density scan. This is done to screen for osteoporosis. You may have this done starting at age 66.  Mammogram. This may be done every 1-2 years. Talk to your health care provider about how often you  should have regular mammograms. Talk with your health care provider about your test results, treatment options, and if necessary, the need for more tests. Vaccines  Your health care provider may recommend certain vaccines, such as:  Influenza vaccine. This is recommended  every year.  Tetanus, diphtheria, and acellular pertussis (Tdap, Td) vaccine. You may need a Td booster every 10 years.  Zoster vaccine. You may need this after age 37.  Pneumococcal 13-valent conjugate (PCV13) vaccine. One dose is recommended after age 86.  Pneumococcal polysaccharide (PPSV23) vaccine. One dose is recommended after age 24. Talk to your health care provider about which screenings and vaccines you need and how often you need them. This information is not intended to replace advice given to you by your health care provider. Make sure you discuss any questions you have with your health care provider. Document Released: 09/01/2015 Document Revised: 04/24/2016 Document Reviewed: 06/06/2015 Elsevier Interactive Patient Education  2017 [place] Prevention in the Home Falls can cause injuries. They can happen to people of all ages. There are many things you can do to make your home safe and to help prevent falls. What can I do on the outside of my home?  Regularly fix the edges of walkways and driveways and fix any cracks.  Remove anything that might make you trip as you walk through a door, such as a raised step or threshold.  Trim any bushes or trees on the path to your home.  Use bright outdoor lighting.  Clear any walking paths of anything that might make someone trip, such as rocks or tools.  Regularly check to see if handrails are loose or broken. Make sure that both sides of any steps have handrails.  Any raised decks and porches should have guardrails on the edges.  Have any leaves, snow, or ice cleared regularly.  Use sand or salt on walking paths during winter.  Clean up any spills in your garage right away. This includes oil or grease spills. What can I do in the bathroom?  Use night lights.  Install grab bars by the toilet and in the tub and shower. Do not use towel bars as grab bars.  Use non-skid mats or decals in the tub or shower.  If  you need to sit down in the shower, use a plastic, non-slip stool.  Keep the floor dry. Clean up any water that spills on the floor as soon as it happens.  Remove soap buildup in the tub or shower regularly.  Attach bath mats securely with double-sided non-slip rug tape.  Do not have throw rugs and other things on the floor that can make you trip. What can I do in the bedroom?  Use night lights.  Make sure that you have a light by your bed that is easy to reach.  Do not use any sheets or blankets that are too big for your bed. They should not hang down onto the floor.  Have a firm chair that has side arms. You can use this for support while you get dressed.  Do not have throw rugs and other things on the floor that can make you trip. What can I do in the kitchen?  Clean up any spills right away.  Avoid walking on wet floors.  Keep items that you use a lot in easy-to-reach places.  If you need to reach something above you, use a strong step stool that has a grab bar.  Keep electrical cords out  of the way.  Do not use floor polish or wax that makes floors slippery. If you must use wax, use non-skid floor wax.  Do not have throw rugs and other things on the floor that can make you trip. What can I do with my stairs?  Do not leave any items on the stairs.  Make sure that there are handrails on both sides of the stairs and use them. Fix handrails that are broken or loose. Make sure that handrails are as long as the stairways.  Check any carpeting to make sure that it is firmly attached to the stairs. Fix any carpet that is loose or worn.  Avoid having throw rugs at the top or bottom of the stairs. If you do have throw rugs, attach them to the floor with carpet tape.  Make sure that you have a light switch at the top of the stairs and the bottom of the stairs. If you do not have them, ask someone to add them for you. What else can I do to help prevent falls?  Wear shoes  that:  Do not have high heels.  Have rubber bottoms.  Are comfortable and fit you well.  Are closed at the toe. Do not wear sandals.  If you use a stepladder:  Make sure that it is fully opened. Do not climb a closed stepladder.  Make sure that both sides of the stepladder are locked into place.  Ask someone to hold it for you, if possible.  Clearly mark and make sure that you can see:  Any grab bars or handrails.  First and last steps.  Where the edge of each step is.  Use tools that help you move around (mobility aids) if they are needed. These include:  Canes.  Walkers.  Scooters.  Crutches.  Turn on the lights when you go into a dark area. Replace any light bulbs as soon as they burn out.  Set up your furniture so you have a clear path. Avoid moving your furniture around.  If any of your floors are uneven, fix them.  If there are any pets around you, be aware of where they are.  Review your medicines with your doctor. Some medicines can make you feel dizzy. This can increase your chance of falling. Ask your doctor what other things that you can do to help prevent falls. This information is not intended to replace advice given to you by your health care provider. Make sure you discuss any questions you have with your health care provider. Document Released: 06/01/2009 Document Revised: 01/11/2016 Document Reviewed: 09/09/2014 Elsevier Interactive Patient Education  2017 [employer].

## 2021-01-02 NOTE — Progress Notes (Signed)
Virtual Visit via Telephone Note  I connected with  Donna Patrick on 01/02/21 at 11:45 AM EDT by telephone and verified that I am speaking with the correct person using two identifiers.  Location: Patient: Home Provider: Office Persons participating in the virtual visit: patient/Nurse Health Advisor and Daughter in law [name]    I discussed the limitations, risks, security and privacy concerns of performing an evaluation and management service by telephone and the availability of in person appointments. The patient expressed understanding and agreed to proceed.  Interactive audio and video telecommunications were attempted between this nurse and patient, however failed, due to patient having technical difficulties OR patient did not have access to video capability.  We continued and completed visit with audio only.  Some vital signs may be absent or patient reported.   Marzella Schlein, LPN    Subjective:   Donna Patrick is a 70 y.o. female who presents for an Initial Medicare Annual Wellness Visit.  Review of Systems     Cardiac Risk Factors include: advanced age (>24men, >52 women);dyslipidemia     Objective:    There were no vitals filed for this visit. There is no height or weight on file to calculate BMI.  Advanced Directives 01/02/2021  Does Patient Have a Medical Advance Directive? No  Would patient like information on creating a medical advance directive? Yes (MAU/Ambulatory/Procedural Areas - Information given)    Current Medications (verified) Outpatient Encounter Medications as of 01/02/2021  Medication Sig  . cholecalciferol (VITAMIN D3) 25 MCG (1000 UNIT) tablet Take 2,000 Units by mouth daily.  . Cyanocobalamin 5000 MCG SUBL Place under the tongue daily.  Marland Kitchen donepezil (ARICEPT) 5 MG tablet Take 1 tablet (5 mg total) by mouth at bedtime.  . folic acid (FOLVITE) 1 MG tablet Take 1 tablet (1 mg total) by mouth daily.  Marland Kitchen levothyroxine (SYNTHROID) 88 MCG tablet  Take 1 tablet (88 mcg total) by mouth daily before breakfast.  . MELATONIN PO Take 10 mg by mouth at bedtime.  . memantine (NAMENDA) 10 MG tablet Take 10 mg by mouth 2 (two) times daily.  Marland Kitchen oxybutynin (DITROPAN-XL) 10 MG 24 hr tablet Take 1 tablet (10 mg total) by mouth daily.  . simvastatin (ZOCOR) 40 MG tablet Take 1 tablet (40 mg total) by mouth daily.  Marland Kitchen triamcinolone ointment (KENALOG) 0.5 % Apply 1 application topically 2 (two) times daily.   No facility-administered encounter medications on file as of 01/02/2021.    Allergies (verified) Patient has no known allergies.   History: Past Medical History:  Diagnosis Date  . Dementia (HCC)    diagnosed 8-10 years ago  . Hyperlipidemia   . Incontinence   . Thyroid disease    Past Surgical History:  Procedure Laterality Date  . ABDOMINAL HYSTERECTOMY    . DUPUYTREN CONTRACTURE RELEASE Left    Family History  Problem Relation Age of Onset  . Dementia Mother   . Cancer Father   . Dementia Brother   . Parkinson's disease Brother    Social History   Socioeconomic History  . Marital status: Single    Spouse name: Not on file  . Number of children: Not on file  . Years of education: Not on file  . Highest education level: Not on file  Occupational History  . Not on file  Tobacco Use  . Smoking status: Former Games developer  . Smokeless tobacco: Never Used  Vaping Use  . Vaping Use: Never used  Substance and  Sexual Activity  . Alcohol use: Never  . Drug use: Never  . Sexual activity: Not Currently  Other Topics Concern  . Not on file  Social History Narrative  . Not on file   Social Determinants of Health   Financial Resource Strain: Low Risk   . Difficulty of Paying Living Expenses: Not hard at all  Food Insecurity: No Food Insecurity  . Worried About Programme researcher, broadcasting/film/video in the Last Year: Never true  . Ran Out of Food in the Last Year: Never true  Transportation Needs: No Transportation Needs  . Lack of  Transportation (Medical): No  . Lack of Transportation (Non-Medical): No  Physical Activity: Sufficiently Active  . Days of Exercise per Week: 7 days  . Minutes of Exercise per Session: 30 min  Stress: No Stress Concern Present  . Feeling of Stress : Not at all  Social Connections: Socially Isolated  . Frequency of Communication with Friends and Family: More than three times a week  . Frequency of Social Gatherings with Friends and Family: More than three times a week  . Attends Religious Services: Never  . Active Member of Clubs or Organizations: No  . Attends Banker Meetings: Never  . Marital Status: Never married    Tobacco Counseling Counseling given: Not Answered   Clinical Intake:  Pre-visit preparation completed: Yes  Pain : No/denies pain     BMI - recorded: 26.39 Nutritional Status: BMI 25 -29 Overweight Nutritional Risks: None Diabetes: No  How often do you need to have someone help you when you read instructions, pamphlets, or other written materials from your doctor or pharmacy?: 1 - Never  Diabetic?No  Interpreter Needed?: No  Information entered by :: Lanier Ensign, LPN   Activities of Daily Living In your present state of health, do you have any difficulty performing the following activities: 01/02/2021  Hearing? N  Vision? N  Difficulty concentrating or making decisions? N  Walking or climbing stairs? N  Dressing or bathing? N  Doing errands, shopping? N  Preparing Food and eating ? N  Using the Toilet? N  In the past six months, have you accidently leaked urine? N  Do you have problems with loss of bowel control? N  Managing your Medications? N  Managing your Finances? N  Housekeeping or managing your Housekeeping? N  Some recent data might be hidden    Patient Care Team: Wynn Banker, MD as PCP - General (Family Medicine)  Indicate any recent Medical Services you may have received from other than [facility] providers in  the past year (date may be approximate).     Assessment:   This is a routine wellness examination for Donna Patrick.  Hearing/Vision screen  Hearing Screening   125Hz  250Hz  500Hz  1000Hz  2000Hz  3000Hz  4000Hz  6000Hz  8000Hz   Right ear:           Left ear:           Comments: Pt denies any hearing issues   Vision Screening Comments: Pt will follow up with eye exams   Dietary issues and exercise activities discussed: Current Exercise Habits: Home exercise routine, Type of exercise: walking, Time (Minutes): 30, Frequency (Times/Week): 7, Weekly Exercise (Minutes/Week): 210  Goals Addressed            This Visit's Progress   . Patient Stated       None at this time       Depression Screen PHQ 2/9 Scores 01/02/2021  PHQ -  2 Score 0    Fall Risk Fall Risk  01/02/2021  Falls in the past year? 0  Number falls in past yr: 0  Injury with Fall? 0  Risk for fall due to : Impaired vision  Follow up Falls prevention discussed    FALL RISK PREVENTION PERTAINING TO THE HOME:  Any stairs in or around the home? Yes  If so, are there any without handrails? No  Home free of loose throw rugs in walkways, pet beds, electrical cords, etc? Yes  Adequate lighting in your home to reduce risk of falls? Yes   ASSISTIVE DEVICES UTILIZED TO PREVENT FALLS:  Life alert? No  Use of a cane, walker or w/c? No  Grab bars in the bathroom? No  Shower chair or bench in shower? Yes  Elevated toilet seat or a handicapped toilet? No   TIMED UP AND GO:  Was the test performed? No .    Cognitive Function:     6CIT Screen 01/02/2021  What Year? 4 points  What month? 3 points  Count back from 20 0 points  Months in reverse 4 points  Repeat phrase 10 points    Immunizations Immunization History  Administered Date(s) Administered  . PFIZER(Purple Top)SARS-COV-2 Vaccination 11/18/2019, 12/18/2019, 06/19/2020    TDAP status: Due, Education has been provided regarding the importance of this vaccine.  Advised may receive this vaccine at local pharmacy or Health Dept. Aware to provide a copy of the vaccination record if obtained from local pharmacy or Health Dept. Verbalized acceptance and understanding.  Flu Vaccine status: Due, Education has been provided regarding the importance of this vaccine. Advised may receive this vaccine at local pharmacy or Health Dept. Aware to provide a copy of the vaccination record if obtained from local pharmacy or Health Dept. Verbalized acceptance and understanding.  Pneumococcal vaccine status: Due, Education has been provided regarding the importance of this vaccine. Advised may receive this vaccine at local pharmacy or Health Dept. Aware to provide a copy of the vaccination record if obtained from local pharmacy or Health Dept. Verbalized acceptance and understanding.  Covid-19 vaccine status: Completed vaccines  Qualifies for Shingles Vaccine? Yes   Zostavax completed No   Shingrix Completed?: No.    Education has been provided regarding the importance of this vaccine. Patient has been advised to call insurance company to determine out of pocket expense if they have not yet received this vaccine. Advised may also receive vaccine at local pharmacy or Health Dept. Verbalized acceptance and understanding.  Screening Tests Health Maintenance  Topic Date Due  . Hepatitis C Screening  Never done  . TETANUS/TDAP  Never done  . DEXA SCAN  Never done  . PNA vac Low Risk Adult (1 of 2 - PCV13) Never done  . COLONOSCOPY (Pts 45-61yrs Insurance coverage will need to be confirmed)  08/19/2018  . INFLUENZA VACCINE  03/19/2021  . MAMMOGRAM  10/11/2022  . COVID-19 Vaccine  Completed  . HPV VACCINES  Aged Out    Health Maintenance  Health Maintenance Due  Topic Date Due  . Hepatitis C Screening  Never done  . TETANUS/TDAP  Never done  . DEXA SCAN  Never done  . PNA vac Low Risk Adult (1 of 2 - PCV13) Never done  . COLONOSCOPY (Pts 45-28yrs Insurance coverage  will need to be confirmed)  08/19/2018    Colonoscopy per MD advice  Mammogram status: Completed 10/11/20. Repeat every year  Bone Density status: Ordered scheduled for 04/27/21.  Pt provided with contact info and advised to call to schedule appt.  Additional Screening:  Hepatitis C Screening: does qualify;   Vision Screening: Recommended annual ophthalmology exams for early detection of glaucoma and other disorders of the eye. Is the patient up to date with their annual eye exam?  No  Who is the provider or what is the name of the office in which the patient attends annual eye exams? Moved from [place] will schedule for this year  If pt is not established with a provider, would they like to be referred to a provider to establish care? No .   Dental Screening: Recommended annual dental exams for proper oral hygiene  Community Resource Referral / Chronic Care Management: CRR required this visit?  No   CCM required this visit?  No      Plan:     I have personally reviewed and noted the following in the patient's chart:   . Medical and social history . Use of alcohol, tobacco or illicit drugs  . Current medications and supplements including opioid prescriptions. Patient is not currently taking opioid prescriptions. . Functional ability and status . Nutritional status . Physical activity . Advanced directives . List of other physicians . Hospitalizations, surgeries, and ER visits in previous 12 months . Vitals . Screenings to include cognitive, depression, and falls . Referrals and appointments  In addition, I have reviewed and discussed with patient certain preventive protocols, quality metrics, and best practice recommendations. A written personalized care plan for preventive services as well as general preventive health recommendations were provided to patient.     Marzella Schleinina H Azarie Coriz, LPN   1/61/09605/17/2022   Nurse Notes: None

## 2021-01-03 ENCOUNTER — Other Ambulatory Visit: Payer: Self-pay

## 2021-01-03 ENCOUNTER — Ambulatory Visit (INDEPENDENT_AMBULATORY_CARE_PROVIDER_SITE_OTHER): Payer: Medicare Other | Admitting: Family Medicine

## 2021-01-03 ENCOUNTER — Emergency Department
Admission: EM | Admit: 2021-01-03 | Discharge: 2021-01-04 | Disposition: A | Discharge status: Home / Self Care | Payer: Medicare Other | Attending: Emergency Medicine | Admitting: Emergency Medicine

## 2021-01-03 ENCOUNTER — Encounter: Payer: Self-pay | Admitting: Family Medicine

## 2021-01-03 VITALS — BP 142/70 | HR 86 | Temp 99.6°F | Wt 140.0 lb

## 2021-01-03 DIAGNOSIS — F0390 Unspecified dementia without behavioral disturbance: Secondary | ICD-10-CM | POA: Diagnosis not present

## 2021-01-03 DIAGNOSIS — L989 Disorder of the skin and subcutaneous tissue, unspecified: Secondary | ICD-10-CM

## 2021-01-03 DIAGNOSIS — Z87891 Personal history of nicotine dependence: Secondary | ICD-10-CM | POA: Insufficient documentation

## 2021-01-03 DIAGNOSIS — L02811 Cutaneous abscess of head [any part, except face]: Secondary | ICD-10-CM | POA: Diagnosis not present

## 2021-01-03 DIAGNOSIS — R220 Localized swelling, mass and lump, head: Secondary | ICD-10-CM | POA: Diagnosis present

## 2021-01-03 DIAGNOSIS — Z79899 Other long term (current) drug therapy: Secondary | ICD-10-CM | POA: Diagnosis not present

## 2021-01-03 DIAGNOSIS — L02821 Furuncle of head [any part, except face]: Secondary | ICD-10-CM | POA: Diagnosis not present

## 2021-01-03 DIAGNOSIS — E039 Hypothyroidism, unspecified: Secondary | ICD-10-CM | POA: Insufficient documentation

## 2021-01-03 MED ORDER — DOXYCYCLINE HYCLATE 100 MG PO CAPS: 100.0000 mg | ORAL_CAPSULE | Freq: Two times a day (BID) | ORAL | 0 refills | Status: AC

## 2021-01-03 NOTE — ED Provider Notes (Signed)
[facility] Emergency Department Provider Note  ____________________________________________   Event Date/Time   First MD Initiated Contact with Patient 01/03/21 2320     (approximate)  I have reviewed the triage vital signs and the nursing notes.   HISTORY  Chief Complaint Abscess  Level 5 caveat:  history/ROS limited by chronic dementia  HPI Donna Patrick is a 70 y.o. female with medical history as listed below who presents for evaluation of a lesion on the back of her scalp.  It is developed gradually over time and her son believes that possibly it is  because she has some picking behaviors as result of her dementia and she sometimes picks at her scalp.  Recently it seems to have gotten worse and they went to her primary care doctor yesterday and was prescribed doxycycline.  She has had 1 dose of but the patient's daughter-in-law was concerned because the patient started complaining that it was painful and it seemed to be a little bit bigger tonight.  The patient says that nothing is bothering her currently.  She has not had any fevers and her behavior is otherwise been at baseline.  No recent vomiting.  No respiratory difficulties.  Patient is ambulatory and has no complaints.        Past Medical History:  Diagnosis Date  . Dementia (HCC)    diagnosed 8-10 years ago  . Hyperlipidemia   . Incontinence   . Thyroid disease     Patient Active Problem List   Diagnosis Date Noted  . Hypothyroid 12/15/2020  . B12 deficiency 12/15/2020  . Hyperlipidemia 10/25/2020  . Overactive bladder 10/25/2020  . Dementia (HCC) 10/25/2020    Past Surgical History:  Procedure Laterality Date  . ABDOMINAL HYSTERECTOMY    . DUPUYTREN CONTRACTURE RELEASE Left     Prior to Admission medications   Medication Sig Start Date End Date Taking? Authorizing Provider  cholecalciferol (VITAMIN D3) 25 MCG (1000 UNIT) tablet Take 2,000 Units by mouth daily.    [provider]  Cyanocobalamin 5000 MCG SUBL Place under the tongue daily.    [provider]  donepezil (ARICEPT) 5 MG tablet Take 1 tablet (5 mg total) by mouth at bedtime. 12/15/20   Wynn Banker, MD  doxycycline (VIBRAMYCIN) 100 MG capsule Take 1 capsule (100 mg total) by mouth 2 (two) times daily for 10 days. 01/03/21 01/13/21  Nelwyn Salisbury, MD  folic acid (FOLVITE) 1 MG tablet Take 1 tablet (1 mg total) by mouth daily. 11/01/20   Wynn Banker, MD  levothyroxine (SYNTHROID) 88 MCG tablet Take 1 tablet (88 mcg total) by mouth daily before breakfast. 12/15/20   Koberlein, Jannette Spanner C, MD  MELATONIN PO Take 10 mg by mouth at bedtime.    [provider]  memantine (NAMENDA) 10 MG tablet Take 10 mg by mouth 2 (two) times daily.    [provider]  oxybutynin (DITROPAN-XL) 10 MG 24 hr tablet Take 1 tablet (10 mg total) by mouth daily. 12/25/20   Wynn Banker, MD  simvastatin (ZOCOR) 40 MG tablet Take 1 tablet (40 mg total) by mouth daily. 12/05/20   Philip Aspen, Limmie Patricia, MD  triamcinolone ointment (KENALOG) 0.5 % Apply 1 application topically 2 (two) times daily. 11/27/20   Wynn Banker, MD    Allergies Patient has no known allergies.  Family History  Problem Relation Age of Onset  . Dementia Mother   . Cancer Father   .  Dementia Brother   . Parkinson's disease Brother     Social History Social History   Tobacco Use  . Smoking status: Former Games developer  . Smokeless tobacco: Never Used  Vaping Use  . Vaping Use: Never used  Substance Use Topics  . Alcohol use: Never  . Drug use: Never    Review of Systems Level 5 caveat:  history/ROS limited by chronic dementia   ____________________________________________   PHYSICAL EXAM:  VITAL SIGNS: ED Triage Vitals [01/03/21 2139]  Enc Vitals Group     BP (!) 159/88     Pulse Rate 82     Resp 18     Temp 98.6 F (37 C)     Temp Source Oral     SpO2 99 %     Weight 63.5 kg (140  lb)     Height 1.651 m (5\' 5" )     Head Circumference      Peak Flow      Pain Score 6     Pain Loc      Pain Edu?      Excl. in GC?     Constitutional: Awake and alert, conversant but clearly confused but at her baseline per son. Eyes: Conjunctivae are normal.  Head: Atraumatic.  The patient has a lesion and the center base of her occiput that is consistent with a cutaneous abscess or phlegmon.  It is about 2 to 3 cm in diameter, erythematous and raised and very slightly tender.  On my exam it feels indurated rather than fluctuant.  There is a central area with a scab that looks consistent with recent drainage.  There is no active purulence.  There is no evidence of cellulitis extending from the lesion.  Other than the central scab, there are no other abnormalities on the lesion including no pustules that might suggest more of a fungal (kerion) infection. Nose: No congestion/rhinnorhea. Mouth/Throat: Patient is wearing a mask. Neck: No stridor.  No meningeal signs.   Cardiovascular: Normal rate, regular rhythm. Good peripheral circulation. Respiratory: Normal respiratory effort.  No retractions. Musculoskeletal: No lower extremity tenderness nor edema. No gross deformities of extremities. Neurologic:  No gross focal neurologic deficits are appreciated.  Skin:  Skin is warm, dry and intact.  See head exam above for scalp lesion information.  ____________________________________________   INITIAL IMPRESSION / MDM / ASSESSMENT AND PLAN / ED COURSE  As part of my medical decision making, I reviewed the following data within the electronic MEDICAL RECORD NUMBER History obtained from family, Nursing notes reviewed and incorporated and Old chart reviewed   I reviewed the note from earlier today and in that note the provider documented the lesion is fluctuant.  However, on my exam it feels indurated with an area this suggest recent central drainage.  In my experience, it is very unlikely that much  purulence would be drained even if I were to incise the lesion.  I am worried about the risk of performing a painful and potentially bloody procedure on a demented patient who cannot understand what we are doing with minimal probable benefit.  I talked with the son about this and he is in agreement with my recommendation that we give the doxycycline a chance to work.  I also recommended using warm compresses on the lesion.  I gave my usual and customary return precautions and the son clarified that the patient has an appointment in about 2 weeks with the dermatology clinic.  I also provided the  information for [facility] should they want to try and schedule an earlier visit.  He knows to bring her back if they get significantly worse in spite of the antibiotics or if she develops new or worsening symptoms.           ____________________________________________  FINAL CLINICAL IMPRESSION(S) / ED DIAGNOSES  Final diagnoses:  Scalp abscess     MEDICATIONS GIVEN DURING THIS VISIT:  Medications - No data to display   ED Discharge Orders    None      *Please note:  BITHA FAUTEUX was evaluated in Emergency Department on 01/03/2021 for the symptoms described in the history of present illness. She was evaluated in the context of the global COVID-19 pandemic, which necessitated consideration that the patient might be at risk for infection with the SARS-CoV-2 virus that causes COVID-19. Institutional protocols and algorithms that pertain to the evaluation of patients at risk for COVID-19 are in a state of rapid change based on information released by regulatory bodies including the CDC and federal and state organizations. These policies and algorithms were followed during the patient's care in the ED.  Some ED evaluations and interventions may be delayed as a result of limited staffing during and after the pandemic.*  Note:  This document was prepared using Dragon voice recognition  software and may include unintentional dictation errors.   Loleta Rose, MD 01/03/21 2355

## 2021-01-03 NOTE — ED Triage Notes (Addendum)
Pt to ED with son, pt was seen at pcp for an abscess on the back of her head, pt was prescribed abx to start taking today, but per son pt started to c/o worsening pain. No drainage noted to area. Pt has hx of dementia

## 2021-01-03 NOTE — Discharge Instructions (Signed)
At this point we believe that it would be better to continue using the prescribed antibiotics rather than trying to drain anything out of the lesion; it does not feel currently as if that would be beneficial.  Please consider using warm compresses on the area and make sure she takes her antibiotic.  Follow-up with her regular doctor or with dermatology either as scheduled or at the next available appointment at [facility].  If you are worried that, and spite of the antibiotic treatment, the lesion is getting significantly bigger, or if the patient develops new or worsening symptoms that concern you, please return to the nearest emergency department.

## 2021-01-03 NOTE — Progress Notes (Signed)
   Subjective:    Patient ID: Donna Patrick, female    DOB: 10-25-1950, 70 y.o.   MRN: [id number]  HPI Here with her daughter for skin issues. First she developed a lump on the back of the head about a week ago. This has been getting larger and now has become painful. Also she has numerous spots of irritated skin on her arms and hands, and she picks at the spots repeatedly. They are applying Triamcinolone cream. Finally she has a spot in the nose that they noticed about 2 weeks ago.    Review of Systems  Constitutional: Negative.   Respiratory: Negative.   Cardiovascular: Negative.   Skin: Positive for rash.       Objective:   Physical Exam Constitutional:      Appearance: Normal appearance.  Cardiovascular:     Rate and Rhythm: Normal rate and regular rhythm.     Pulses: Normal pulses.     Heart sounds: Normal heart sounds.  Pulmonary:     Effort: Pulmonary effort is normal.     Breath sounds: Normal breath sounds.  Skin:    Comments: There is a tender nodular lump on the back of the head which is fluctuant. There is an ulcerated lesion with raised pearly borders on the border of the left nostril. There are also numerous areas of red, scaly skin on the dorsal surfaces of both forearms and both hands   Neurological:     Mental Status: She is alert.           Assessment & Plan:  There is a boil on the scalp and we will treat this with Doxycycline. 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  Gershon Crane, MD

## 2021-01-04 ENCOUNTER — Ambulatory Visit: Payer: Medicare Other

## 2021-01-09 ENCOUNTER — Ambulatory Visit (INDEPENDENT_AMBULATORY_CARE_PROVIDER_SITE_OTHER): Payer: Medicare Other | Admitting: Neurology

## 2021-01-09 ENCOUNTER — Encounter: Payer: Self-pay | Admitting: Neurology

## 2021-01-09 VITALS — BP 128/70 | HR 77 | Ht 65.0 in | Wt 142.0 lb

## 2021-01-09 DIAGNOSIS — E538 Deficiency of other specified B group vitamins: Secondary | ICD-10-CM | POA: Diagnosis not present

## 2021-01-09 DIAGNOSIS — R451 Restlessness and agitation: Secondary | ICD-10-CM

## 2021-01-09 DIAGNOSIS — N3281 Overactive bladder: Secondary | ICD-10-CM | POA: Diagnosis not present

## 2021-01-09 DIAGNOSIS — F0391 Unspecified dementia with behavioral disturbance: Secondary | ICD-10-CM

## 2021-01-09 MED ORDER — RISPERIDONE 0.5 MG PO TABS
0.5000 mg | ORAL_TABLET | Freq: Two times a day (BID) | ORAL | 11 refills | Status: DC
Start: 2021-01-09 — End: 2021-03-26

## 2021-01-09 MED ORDER — TROSPIUM CHLORIDE 20 MG PO TABS: 20.0000 mg | ORAL_TABLET | Freq: Two times a day (BID) | ORAL | 11 refills | Status: AC

## 2021-01-09 NOTE — Progress Notes (Signed)
[facility]  PATIENT: Donna Patrick DOB: [birth year]  REFERRING DOCTOR OR PCP:  Theodis Shove, MD SOURCE: Patient, daughter-in-law, son, lab results and notes from primary care  _________________________________   HISTORICAL  CHIEF COMPLAINT:  Chief Complaint  Patient presents with  . New Patient (Initial Visit)    RM 82 with daughter in law, [place]. Internal referral from Theodis Shove, MD fro alzheimer's dementia w/ behavioral disturbance. MOCA today; 3 (=1 for education of 12 yr)=4/30.    HISTORY OF PRESENT ILLNESS:  I had the pleasure of seeing your patient, Donna Patrick, at [facility] for neurologic consultation regarding her memory decline and behavioral issues.     She is a 70 year old woman with memory and behavioral difficulties.  She recognizes that she has had some cognitive difficulties but feels that they have began recently.  However, her daughter in law notes she has progressively worsened over the last 7 years.    She was living with her long term companion for multiple years but he left in late 2021.   He felt he could no longer help Donna Patrick so she moved into her son's and daughter in law's place in January 2022.     She is independent with voiding and eating but needs to be reminded to take showers.    Her family helps her with shopping and more complex household tasks.      She has been on donepezil x 7 years.  Memantine was added at some point during the last 7 years.  She apparently saw a neurologist about 7 years ago but she has seen only her PCP in [place] between 2014 and 2021.   She moved to  [place] early 2022 and now sees Dr. Hassan Rowan regularly.  Her family was unaware of how much her memory had declined until they received a phone call from Donna Patrick's companion towards the end of 2021 stating that she was having too many problems for him to continue to look after her.  She was having a lot of difficulty with insomnia  initially.  However, she is sleeping better with melatonin.  She had been wandering around at night before this medication.   She sleeps from 830 pm to 745 am.   Her schedule is fairly regular now that she lives with her family.  She is on oxybutynin for urinary frequency..  Without it she was going to the bathroom 1-2 times per hour at night.     She has had some behavioral issues.  Specifically, she has had outbursts with agitation or anger.   These occur several times a day.   Many of the outbursts are directed to her reflection in the mirror.  She also sees people who are not there and sometimes gets angry at them.   Her family can calm her.      Labs 10/27/2020:   B12 was low at 185 (211-911) and she takes a sublingual supplement.  Vit D was low and she takes a supplement.   Folate was low and she is taking a supplement   Her mother also had dementia though details are not known.  Montreal Cognitive Assessment  01/09/2021  Visuospatial/ Executive (0/5) 0  Naming (0/3) 1  Attention: Read list of digits (0/2) 1  Attention: Read list of letters (0/1) 0  Attention: Serial 7 subtraction starting at 100 (0/3) 0  Language: Repeat phrase (0/2) 0  Language : Fluency (0/1) 0  Abstraction (0/2) 0  Delayed Recall (0/5) 0  Orientation (0/6) 1  Total 3  Adjusted Score (based on education) 4     REVIEW OF SYSTEMS: Constitutional: No fevers, chills, sweats, or change in appetite Eyes: No visual changes, double vision, eye pain Ear, nose and throat: No hearing loss, ear pain, nasal congestion, sore throat Cardiovascular: No chest pain, palpitations Respiratory: No shortness of breath at rest or with exertion.   No wheezes GastrointestinaI: No nausea, vomiting, diarrhea, abdominal pain, fecal incontinence Genitourinary:She has urinary urgency and nocturia Musculoskeletal: No neck pain, back pain Integumentary: No rash, pruritus, skin lesions Neurological: as above Psychiatric: No depression  at this time.  No anxiety Endocrine: No palpitations, diaphoresis, change in appetite, change in weigh or increased thirst Hematologic/Lymphatic: No anemia, purpura, petechiae. Allergic/Immunologic: No itchy/runny eyes, nasal congestion, recent allergic reactions, rashes  ALLERGIES: No Known Allergies  HOME MEDICATIONS:  Current Outpatient Medications:  .  cholecalciferol (VITAMIN D3) 25 MCG (1000 UNIT) tablet, Take 2,000 Units by mouth daily., Disp: , Rfl:  .  Cyanocobalamin 5000 MCG SUBL, Place under the tongue daily., Disp: , Rfl:  .  donepezil (ARICEPT) 5 MG tablet, Take 1 tablet (5 mg total) by mouth at bedtime., Disp: 90 tablet, Rfl: 1 .  doxycycline (VIBRAMYCIN) 100 MG capsule, Take 1 capsule (100 mg total) by mouth 2 (two) times daily for 10 days., Disp: 20 capsule, Rfl: 0 .  folic acid (FOLVITE) 1 MG tablet, Take 1 tablet (1 mg total) by mouth daily., Disp: 90 tablet, Rfl: 1 .  levothyroxine (SYNTHROID) 88 MCG tablet, Take 1 tablet (88 mcg total) by mouth daily before breakfast., Disp: 90 tablet, Rfl: 1 .  MELATONIN PO, Take 10 mg by mouth at bedtime., Disp: , Rfl:  .  memantine (NAMENDA) 10 MG tablet, Take 10 mg by mouth 2 (two) times daily., Disp: , Rfl:  .  oxybutynin (DITROPAN-XL) 10 MG 24 hr tablet, Take 1 tablet (10 mg total) by mouth daily., Disp: 90 tablet, Rfl: 1 .  risperiDONE (RISPERDAL) 0.5 MG tablet, Take 1 tablet (0.5 mg total) by mouth 2 (two) times daily., Disp: 60 tablet, Rfl: 11 .  simvastatin (ZOCOR) 40 MG tablet, Take 1 tablet (40 mg total) by mouth daily., Disp: 90 tablet, Rfl: 1 .  triamcinolone ointment (KENALOG) 0.5 %, Apply 1 application topically 2 (two) times daily., Disp: 30 g, Rfl: 0 .  trospium (SANCTURA) 20 MG tablet, Take 1 tablet (20 mg total) by mouth 2 (two) times daily., Disp: 60 tablet, Rfl: 11  PAST MEDICAL HISTORY: Past Medical History:  Diagnosis Date  . Dementia (HCC)    diagnosed 8-10 years ago  . Hyperlipidemia   . Incontinence   .  Thyroid disease     PAST SURGICAL HISTORY: Past Surgical History:  Procedure Laterality Date  . ABDOMINAL HYSTERECTOMY    . DUPUYTREN CONTRACTURE RELEASE Left     FAMILY HISTORY: Family History  Problem Relation Age of Onset  . Dementia Mother   . Cancer Father   . Dementia Brother   . Parkinson's disease Brother     SOCIAL HISTORY:  Social History   Socioeconomic History  . Marital status: Single    Spouse name: Not on file  . Number of children: Not on file  . Years of education: Not on file  . Highest education level: Not on file  Occupational History  . Not on file  Tobacco Use  . Smoking status: Former Games developer  . Smokeless tobacco: Never Used  Vaping  Use  . Vaping Use: Never used  Substance and Sexual Activity  . Alcohol use: Never  . Drug use: Never  . Sexual activity: Not Currently  Other Topics Concern  . Not on file  Social History Narrative   Caffeine use:  None   Right handed   Lives with family   Social Determinants of Health   Financial Resource Strain: Low Risk   . Difficulty of Paying Living Expenses: Not hard at all  Food Insecurity: No Food Insecurity  . Worried About Programme researcher, broadcasting/film/videounning Out of Food in the Last Year: Never true  . Ran Out of Food in the Last Year: Never true  Transportation Needs: No Transportation Needs  . Lack of Transportation (Medical): No  . Lack of Transportation (Non-Medical): No  Physical Activity: Sufficiently Active  . Days of Exercise per Week: 7 days  . Minutes of Exercise per Session: 30 min  Stress: No Stress Concern Present  . Feeling of Stress : Not at all  Social Connections: Socially Isolated  . Frequency of Communication with Friends and Family: More than three times a week  . Frequency of Social Gatherings with Friends and Family: More than three times a week  . Attends Religious Services: Never  . Active Member of Clubs or Organizations: No  . Attends BankerClub or Organization Meetings: Never  . Marital Status:  Never married  Intimate Partner Violence: Not At Risk  . Fear of Current or Ex-Partner: No  . Emotionally Abused: No  . Physically Abused: No  . Sexually Abused: No     PHYSICAL EXAM  Vitals:   01/09/21 1100  BP: 128/70  Pulse: 77  SpO2: 96%  Weight: 142 lb (64.4 kg)  Height: 5\' 5"  (1.651 m)    Body mass index is 23.63 kg/m.   General: The patient is well-developed and well-nourished and in no acute distress  HEENT:  Head is [place]/AT.  Sclera are anicteric.  Funduscopic exam shows normal optic discs and retinal vessels.  Neck: No carotid bruits are noted.  The neck is nontender.  Cardiovascular: The heart has a regular rate and rhythm with a normal S1 and S2. There were no murmurs, gallops or rubs.    Skin: Extremities are without rash or  edema.  Musculoskeletal:  Back is nontender  Neurologic Exam  Mental status: The patient is alert and oriented x 3 at the time of the examination. The patient has apparent normal recent and remote memory, with an apparently normal attention span and concentration ability.   Speech is normal.  Cranial nerves: Extraocular movements are full. Pupils are equal, round, and reactive to light and accomodation.  Visual fields are full.  Facial symmetry is present. There is good facial sensation to soft touch bilaterally.Facial strength is normal.  Trapezius and sternocleidomastoid strength is normal. No dysarthria is noted.  The tongue is midline, and the patient has symmetric elevation of the soft palate. No obvious hearing deficits are noted.  Motor:  Muscle bulk is normal.   Tone is normal. Strength is  5 / 5 in all 4 extremities.   Sensory: Sensory testing is intact to pinprick, soft touch and vibration sensation in all 4 extremities.  Coordination: Cerebellar testing reveals good finger-nose-finger and heel-to-shin bilaterally.  Gait and station: Station is normal.   Gait is normal. Tandem gait is normal. Romberg is negative.   Reflexes:  Deep tendon reflexes are symmetric and normal bilaterally.   Plantar responses are flexor.    DIAGNOSTIC  DATA (LABS, IMAGING, TESTING) - I reviewed patient records, labs, notes, testing and imaging myself where available.  Lab Results  Component Value Date   WBC 8.9 10/25/2020   HGB 13.0 10/25/2020   HCT 38.1 10/25/2020   MCV 89.8 10/25/2020   PLT 299.0 10/25/2020      Component Value Date/Time   NA 139 10/25/2020 1222   K 3.6 10/25/2020 1222   CL 101 10/25/2020 1222   CO2 30 10/25/2020 1222   GLUCOSE 107 (H) 10/25/2020 1222   BUN 10 10/25/2020 1222   CREATININE 0.86 10/25/2020 1222   CALCIUM 9.6 10/25/2020 1222   PROT 7.2 10/25/2020 1222   ALBUMIN 4.0 10/25/2020 1222   AST 19 10/25/2020 1222   ALT 19 10/25/2020 1222   ALKPHOS 126 (H) 10/25/2020 1222   BILITOT 0.5 10/25/2020 1222   Lab Results  Component Value Date   CHOL 141 10/25/2020   HDL 42.90 10/25/2020   LDLCALC 61 10/25/2020   TRIG 185.0 (H) 10/25/2020   CHOLHDL 3 10/25/2020   No results found for: HGBA1C Lab Results  Component Value Date   VITAMINB12 185 (L) 10/25/2020   Lab Results  Component Value Date   TSH 4.22 10/25/2020       ASSESSMENT AND PLAN  Dementia with behavioral disturbance, unspecified dementia type (HCC)  Agitation  B12 deficiency  Overactive bladder  In summary, Ms. Dysert is a 70 year old woman with progressive cognitive decline over the past 7 years or more recent behavioral outbursts.  She performed poorly on the [facility] cognitive assessment scoring only 4/30 points.  This would place her in a severe dementia category.  I believe she most likely has Alzheimer's disease with agitation.  Frontotemporal dementia would be less likely given the duration of the decline and the type of behavioral changes she is having.  We will check a CT scan of the head to determine if there is an alternative explanation and to assess the atrophy patterns.  She is advised to continue donepezil  and memantine.  Unfortunately, there are a few medications to treat Alzheimer's disease.  She is also advised to stay physically active and eat well as it is probable that lifestyle will slow the progression more than the medications well.  She is she is having agitation associated with looking in the mirror most frequently.  This occurs throughout the day.  I will prescribe low-dose Risperdal.  Additionally, I will have her switch from oxybutynin to trospium as it has much less penetrance into the central nervous system and would be less likely to affect cognition.  Myrbetriq might be a better option but would be expensive.  He will return to see [place] in 4 months or sooner if there are new or worsening neurologic symptoms.  Thank you for asking to see Ms Patrick.  Please let me know if I can be of further assistance with her or other patients in the future.     Brittiney Dicostanzo A. Epimenio Foot, MD, [facility] 01/09/2021, 6:14 PM Certified in Neurology, Clinical Neurophysiology, Sleep Medicine and Neuroimaging  [facility] [address], [address] [place], [place] [address] [phone]

## 2021-01-11 ENCOUNTER — Telehealth: Payer: Self-pay | Admitting: Family Medicine

## 2021-01-11 NOTE — Telephone Encounter (Signed)
Pt daughter -law call and stated the melatonin is not working and she need a call to tell her what she need to do.

## 2021-01-12 ENCOUNTER — Other Ambulatory Visit: Payer: Self-pay | Admitting: Family Medicine

## 2021-01-12 NOTE — Telephone Encounter (Signed)
I would like for Donna Patrick to ask Dr. Epimenio Foot. Looks like he documented 3 days ago that melatonin was helping and she was getting 11 hours sleep with this. He also changed some medications that could affect sleep. Not sure if changes have been started yet or if new meds are playing a role.

## 2021-01-12 NOTE — Telephone Encounter (Signed)
Spoke with the patient's son and informed him of the message below.  °

## 2021-01-16 ENCOUNTER — Telehealth: Payer: Self-pay | Admitting: Neurology

## 2021-01-16 NOTE — Telephone Encounter (Signed)
Pt's daughter-in-law, Annaleia Pence (on [place]) called, every since she started one of these new medication at her last appt. Do not know which one; trospium (SANCTURA) 20 MG tablet or risperiDONE (RISPERDAL) 0.5 MG tablet is causing her not to sleep. She has tried to go outside in the middle of night.  We cannot get any sleep at night, we have to be up and cannot get her to go back to sleep. Would like a call from the nurse.

## 2021-01-16 NOTE — Telephone Encounter (Signed)
She should stop the trospium and see if symptoms improved.

## 2021-01-16 NOTE — Telephone Encounter (Signed)
DIL stated that she was on 10 mg time release melotonin prior to the addition of these 2 medications.  Patient was sleeping without any difficulties before.  She stated she was told to call the office if this happened.  Looking for advice from Dr. Epimenio Foot.

## 2021-01-16 NOTE — Telephone Encounter (Signed)
Called and spoke to DIL and discussed Dr. Ledon Snare recommendation.  She stated she was concerned that her outbursts would return as she hasn't had any since starting that medicaiton.  She is going to stop giving it to her for a couple days and call back and let us know she is doing.  Patient denied further questions, verbalized understanding and expressed appreciation for the phone call.

## 2021-01-18 ENCOUNTER — Other Ambulatory Visit: Payer: Self-pay | Admitting: Neurology

## 2021-01-18 ENCOUNTER — Telehealth: Payer: Self-pay | Admitting: Neurology

## 2021-01-18 DIAGNOSIS — F0281 Dementia in other diseases classified elsewhere with behavioral disturbance: Secondary | ICD-10-CM

## 2021-01-18 DIAGNOSIS — G309 Alzheimer's disease, unspecified: Secondary | ICD-10-CM

## 2021-01-18 NOTE — Telephone Encounter (Signed)
Received voicemail from patient's daughter, Gabriel Rung, asking about scheduling an MRI for patient. I do not see an order put in. Is patient needing MRI?

## 2021-01-18 NOTE — Telephone Encounter (Signed)
Returned patient's daughter and informed her that it is a CT scan ordered and would need to allow up to 7 days for insurance authorization and a scheduler to call.  Patient denied further questions, verbalized understanding and expressed appreciation for the phone call.

## 2021-01-18 NOTE — Telephone Encounter (Signed)
I placed an order for a CT scan of the head (I think she would have difficulty staying still for 20 minutes for an MRI)

## 2021-01-22 ENCOUNTER — Telehealth: Payer: Self-pay | Admitting: Neurology

## 2021-01-22 NOTE — Telephone Encounter (Signed)
CT order sent to [facility]. [employer] Medicare/BCBS supplement. GI will call patient to schedule. [phone].

## 2021-01-22 NOTE — Telephone Encounter (Signed)
[facility] Kluck(daughter in law on [place]) is asking if she and family should be concerned that pt has gone from 3-4 meals a day to up to 6 meals a day since pt has been on a steroid cream prescribed by Dr Epimenio Foot, please call

## 2021-01-22 NOTE — Telephone Encounter (Signed)
Called daughter in law back. Advised Dr. Epimenio Foot did not prescribe steroid cream. She meant to call dermatology office. She called our office by mistake. Nothing further needed.

## 2021-01-26 ENCOUNTER — Encounter: Payer: Self-pay | Admitting: Family Medicine

## 2021-01-26 ENCOUNTER — Telehealth: Payer: Self-pay | Admitting: Neurology

## 2021-01-26 ENCOUNTER — Telehealth: Payer: Self-pay | Admitting: Family Medicine

## 2021-01-26 DIAGNOSIS — F0281 Dementia in other diseases classified elsewhere with behavioral disturbance: Secondary | ICD-10-CM

## 2021-01-26 DIAGNOSIS — R451 Restlessness and agitation: Secondary | ICD-10-CM

## 2021-01-26 DIAGNOSIS — F0391 Unspecified dementia with behavioral disturbance: Secondary | ICD-10-CM

## 2021-01-26 NOTE — Telephone Encounter (Signed)
Pts daughter-in-law is calling in stating that they are needing some HH services to help with daily chores (bathing, feeding, doctor appointments (PRN) and to other appointments)  2-3 times a week.  Daughter-in-law would like to have a call back.

## 2021-01-26 NOTE — Telephone Encounter (Signed)
Daughter in law [place] called wanting to know if she can be advised on what assistance she can get for an in home assistance for the pt. Please advise.

## 2021-01-27 ENCOUNTER — Other Ambulatory Visit: Payer: Self-pay

## 2021-01-27 ENCOUNTER — Ambulatory Visit
Admission: RE | Admit: 2021-01-27 | Discharge: 2021-01-27 | Disposition: A | Discharge status: Home / Self Care | Payer: Medicare Other | Source: Ambulatory Visit | Attending: Family Medicine | Admitting: Family Medicine

## 2021-01-27 DIAGNOSIS — Z1231 Encounter for screening mammogram for malignant neoplasm of breast: Secondary | ICD-10-CM

## 2021-01-29 ENCOUNTER — Other Ambulatory Visit: Payer: Self-pay | Admitting: Family Medicine

## 2021-01-29 DIAGNOSIS — F0281 Dementia in other diseases classified elsewhere with behavioral disturbance: Secondary | ICD-10-CM

## 2021-01-29 DIAGNOSIS — G309 Alzheimer's disease, unspecified: Secondary | ICD-10-CM

## 2021-01-29 DIAGNOSIS — R451 Restlessness and agitation: Secondary | ICD-10-CM

## 2021-01-29 DIAGNOSIS — E039 Hypothyroidism, unspecified: Secondary | ICD-10-CM

## 2021-01-29 DIAGNOSIS — E538 Deficiency of other specified B group vitamins: Secondary | ICD-10-CM

## 2021-01-29 DIAGNOSIS — E785 Hyperlipidemia, unspecified: Secondary | ICD-10-CM

## 2021-01-29 DIAGNOSIS — N3281 Overactive bladder: Secondary | ICD-10-CM

## 2021-01-29 NOTE — Telephone Encounter (Signed)
Referral for Home Health sent to [facility]. [phone].

## 2021-01-29 NOTE — Telephone Encounter (Signed)
Ok to send through [employer]. I had put in order, but I see Dr. Epimenio Foot put one in just today too. Since he saw her more recently this may help with coverage insurance wise so I am not entering an order but am happy to sign off for home health on their orders, etc, if needed.

## 2021-01-29 NOTE — Telephone Encounter (Signed)
Called and spoke with daughter in law on [place]. Apologized that we are now just calling, advised our office is closed on Friday's. She is agreeable to home health referral. She is aware we will include nursing/PT/home health aid. She would like her phone# placed on referral so she can help coordinate scheduling: [phone]  I placed referral.

## 2021-01-31 ENCOUNTER — Other Ambulatory Visit: Payer: Self-pay | Admitting: Family Medicine

## 2021-01-31 DIAGNOSIS — R928 Other abnormal and inconclusive findings on diagnostic imaging of breast: Secondary | ICD-10-CM

## 2021-01-31 NOTE — Telephone Encounter (Signed)
Sibel Khurana is the patients daughter-in-law and she was calling to get the results from her visit at [facility] on 06/11.  Please advise

## 2021-02-02 ENCOUNTER — Encounter: Payer: Self-pay | Admitting: Family Medicine

## 2021-02-02 ENCOUNTER — Telehealth (INDEPENDENT_AMBULATORY_CARE_PROVIDER_SITE_OTHER): Payer: Medicare Other | Admitting: Family Medicine

## 2021-02-02 VITALS — BP 128/78 | HR 89 | Temp 96.6°F

## 2021-02-02 DIAGNOSIS — G309 Alzheimer's disease, unspecified: Secondary | ICD-10-CM

## 2021-02-02 DIAGNOSIS — N63 Unspecified lump in unspecified breast: Secondary | ICD-10-CM | POA: Diagnosis not present

## 2021-02-02 DIAGNOSIS — F0281 Dementia in other diseases classified elsewhere with behavioral disturbance: Secondary | ICD-10-CM | POA: Diagnosis not present

## 2021-02-02 MED ORDER — LORAZEPAM 0.5 MG PO TABS: ORAL_TABLET | ORAL | 1 refills | Status: DC

## 2021-02-02 NOTE — Progress Notes (Signed)
Virtual Visit via Video Note  I connected with Donna Patrick and daughter in law [facility]) on 02/06/21 at 11:00 AM EDT by a video enabled telemedicine application and verified that I am speaking with the correct person using two identifiers.  Location patient: home Location provider: [facility]  [address] [place], [place] [address] Persons participating in the virtual visit: patient, provider  I discussed the limitations of evaluation and management by telemedicine and the availability of in person appointments. The patient expressed understanding and agreed to proceed.   Donna Patrick DOB: Jan 30, 1951 Encounter date: 02/02/2021  This is a 70 y.o. female who presents with Chief Complaint  Patient presents with   Follow-up    History of present illness: Urinary frequency; bladder med restarted today. Tried to do without but frequency was very problematic.   She is hard to redirect; repeating selves over and over. DIL states that original mammogram was difficult as she had to continuously be redirected.   Waking up very early - waking u and walking around.   Home care sent out from neurology - sent nurse. Sent PT. DIL states that she aid that was there said she was just there to help her in the shower and out of the shower.   DIL is only getting 2 hours of sleep trying to keep everything managed. Malasia is always pulling things out of drawers, fridge, pantry. Gets in and out of bed through day and undresses, redresses.    No Known Allergies Current Meds  Medication Sig   cholecalciferol (VITAMIN D3) 25 MCG (1000 UNIT) tablet Take 2,000 Units by mouth daily.   Cyanocobalamin 5000 MCG SUBL Place under the tongue daily.   donepezil (ARICEPT) 5 MG tablet Take 1 tablet (5 mg total) by mouth at bedtime.   folic acid (FOLVITE) 1 MG tablet Take 1 tablet (1 mg total) by mouth daily.   levothyroxine (SYNTHROID) 88 MCG tablet Take 1 tablet (88 mcg total) by mouth daily  before breakfast.   LORazepam (ATIVAN) 0.5 MG tablet Take 1-2 tablets PO 30-60 minutes prior to procedure.   memantine (NAMENDA) 10 MG tablet Take 10 mg by mouth 2 (two) times daily.   oxybutynin (DITROPAN-XL) 10 MG 24 hr tablet Take 1 tablet (10 mg total) by mouth daily.   risperiDONE (RISPERDAL) 0.5 MG tablet Take 1 tablet (0.5 mg total) by mouth 2 (two) times daily.   simvastatin (ZOCOR) 40 MG tablet Take 1 tablet (40 mg total) by mouth daily.   trospium (SANCTURA) 20 MG tablet Take 1 tablet (20 mg total) by mouth 2 (two) times daily.   [DISCONTINUED] triamcinolone ointment (KENALOG) 0.5 % Apply 1 application topically 2 (two) times daily.    Review of Systems  Constitutional:  Negative for chills, fatigue and fever.  Respiratory:  Negative for cough, chest tightness, shortness of breath and wheezing.   Cardiovascular:  Negative for chest pain, palpitations and leg swelling.  Psychiatric/Behavioral:  Positive for agitation, confusion, decreased concentration and sleep disturbance. The patient is nervous/anxious.    Objective:  BP 128/78   Pulse 89   Temp (!) 96.6 F (35.9 C)       BP Readings from Last 3 Encounters:  02/02/21 128/78  01/09/21 128/70  01/03/21 (!) 141/80   Wt Readings from Last 3 Encounters:  01/09/21 142 lb (64.4 kg)  01/03/21 140 lb (63.5 kg)  01/03/21 140 lb (63.5 kg)    EXAM:  GENERAL: alert, oriented, appears well  and in no acute distress  HEENT: atraumatic, conjunctiva clear, no obvious abnormalities on inspection of external nose and ears  NECK: normal movements of the head and neck  LUNGS: on inspection no signs of respiratory distress, breathing rate appears normal, no obvious gross SOB, gasping or wheezing  CV: no obvious cyanosis  MS: moves all visible extremities without noticeable abnormality  PSYCH/NEURO: patient is pleasant and happy; oriented to person and place. Memory and insight are poor.  Assessment/Plan  1. Breast mass We  discussed follow up plan for additional imaging. I did give some ativan to help with agitation during these studies.   2. Alzheimer's dementia with behavioral disturbance, unspecified timing of dementia onset (HCC) I have placed referral for care coordination. DIL is managing everything with [place] and her level of care is escalating. She would like to continue to care for her in the home, but needs some help since she is still working full time and Yoselin is currently more than full time care requirement. [place] states that  Allyn will not go to adult care center. Will see if social work is able to provide some other resources for family as they work on [employer] care.   Due to continued significant agitation; I did suggest increase in risperdal to 1 tab in morning, 2 in evening. She is following with neurology but does not have follow up set yet with Dr. Epimenio Foot.  - AMB Referral to [facility]ordin[facility]     I discussed the assessment and treatment plan with the patient. The patient was provided an opportunity to ask questions and all were answered. The patient agreed with the plan and demonstrated an understanding of the instructions.   The patient was advised to call back or seek an in-person evaluation if the symptoms worsen or if the condition fails to improve as anticipated.  I provided 25 minutes of non-face-to-face time during this encounter.   Theodis Shove, MD

## 2021-02-03 NOTE — Telephone Encounter (Signed)
We had virtual visit to review. See note.

## 2021-02-05 ENCOUNTER — Telehealth: Payer: Self-pay | Admitting: *Deleted

## 2021-02-05 NOTE — Chronic Care Management (AMB) (Signed)
  Care Management  Note   02/05/2021 Name: Donna Patrick MRN: [id number] DOB: 1951-01-30  [name] is a 70 y.o. year old female who is a primary care patient of Koberlein, Steele Berg, MD. The care management team was consulted for assistance with chronic disease management and care coordination needs.   [name] was given information about Care Management services today including:  CCM service includes personalized support from designated clinical staff supervised by the physician, including individualized plan of care and coordination with other care providers 24/7 contact phone numbers for assistance for urgent and routine care needs. Service will only be billed when office clinical staff spend 20 minutes or more in a month to coordinate care. Only one practitioner may furnish and bill the service in a calendar month. The patient may stop CCM services at amy time (effective at the end of the month) by phone call to the office staff. The patient will be responsible for cost sharing (co-pay) or up to 20% of the service fee (after annual deductible is met)  Patient agreed to services and verbal consent obtained. Follow up Plan:  An initial telephone outreach has been scheduled for: 02/21/2021  Julian Hy, [place] Management  Direct Dial: [phone]

## 2021-02-05 NOTE — Chronic Care Management (AMB) (Signed)
  Care Management   Note  02/05/2021 Name: Donna Patrick MRN: [id number] DOB: Feb 22, 1951  Donna Patrick is a 70 y.o. year old female who is a primary care patient of Koberlein, [name], MD and is actively engaged with the care management team. I reached out to Donna Patrick by phone today to assist with scheduling an initial visit with the Licensed Clinical Social Worker  Follow up plan: Telephone appointment with care management team member scheduled for:02/21/2021   Burman Nieves, CCMA Care Guide, Embedded Care Coordination [facility]  Care Management  Direct Dial: [phone]

## 2021-02-07 ENCOUNTER — Other Ambulatory Visit: Payer: Self-pay | Admitting: Family Medicine

## 2021-02-07 ENCOUNTER — Telehealth: Payer: Self-pay | Admitting: Family Medicine

## 2021-02-07 MED ORDER — MEMANTINE HCL 10 MG PO TABS: 10.0000 mg | ORAL_TABLET | Freq: Two times a day (BID) | ORAL | 1 refills | Status: DC

## 2021-02-07 NOTE — Telephone Encounter (Signed)
memantine (NAMENDA) 10 MG tablet  [id number] [place] Otto, [place] - [address] AT [facility] OF [address] [facility] [address] Phone:  [phone]  Fax:  [phone]

## 2021-02-13 ENCOUNTER — Ambulatory Visit
Admission: RE | Admit: 2021-02-13 | Discharge: 2021-02-13 | Disposition: A | Discharge status: Home / Self Care | Payer: Medicare Other | Source: Ambulatory Visit | Attending: Neurology | Admitting: Neurology

## 2021-02-13 ENCOUNTER — Other Ambulatory Visit: Payer: Self-pay

## 2021-02-13 DIAGNOSIS — G309 Alzheimer's disease, unspecified: Secondary | ICD-10-CM

## 2021-02-13 DIAGNOSIS — F0281 Dementia in other diseases classified elsewhere with behavioral disturbance: Secondary | ICD-10-CM | POA: Diagnosis not present

## 2021-02-14 ENCOUNTER — Telehealth: Payer: Self-pay | Admitting: Neurology

## 2021-02-14 NOTE — Telephone Encounter (Signed)
Pt's Daughter-in-law, Kuuipo Anzaldo called, discuss CT scan results received on MyChart. Informed Ms. Bernat nurse will call to discuss result after Dr. Epimenio Foot reviews CT scan.

## 2021-02-14 NOTE — Telephone Encounter (Signed)
Called back and relayed results per Dr. Bonnita Hollow note. She verbalized understanding. We referred her to [facility] They were unable to provide help needed. PCP referred to another place, they come out 02/21/21. They are longterm/more hands on. She could not remember the name.

## 2021-02-21 ENCOUNTER — Telehealth: Payer: Medicare Other

## 2021-02-22 ENCOUNTER — Ambulatory Visit (INDEPENDENT_AMBULATORY_CARE_PROVIDER_SITE_OTHER): Payer: Medicare Other | Admitting: *Deleted

## 2021-02-22 ENCOUNTER — Telehealth: Payer: Self-pay | Admitting: Family Medicine

## 2021-02-22 DIAGNOSIS — E785 Hyperlipidemia, unspecified: Secondary | ICD-10-CM

## 2021-02-22 DIAGNOSIS — E039 Hypothyroidism, unspecified: Secondary | ICD-10-CM

## 2021-02-22 DIAGNOSIS — F0280 Dementia in other diseases classified elsewhere without behavioral disturbance: Secondary | ICD-10-CM

## 2021-02-22 DIAGNOSIS — F0281 Dementia in other diseases classified elsewhere with behavioral disturbance: Secondary | ICD-10-CM

## 2021-02-22 DIAGNOSIS — G309 Alzheimer's disease, unspecified: Secondary | ICD-10-CM | POA: Diagnosis not present

## 2021-02-22 DIAGNOSIS — R451 Restlessness and agitation: Secondary | ICD-10-CM

## 2021-02-22 NOTE — Telephone Encounter (Signed)
Spoke with the pts daughter in law and she was questioning how the patient should take Lorazepam prior to the breast study tomorrow.  Per the medication list, the information was given to take 1-2 tablets 30-60 minutes prior to the procedure.

## 2021-02-22 NOTE — Telephone Encounter (Signed)
Patient called stating that she needs instructions on a sedative that was prescribed by Dr. Hassan Rowan.  Please advise.

## 2021-02-23 ENCOUNTER — Other Ambulatory Visit: Payer: Self-pay

## 2021-02-23 ENCOUNTER — Ambulatory Visit
Admission: RE | Admit: 2021-02-23 | Discharge: 2021-02-23 | Disposition: A | Discharge status: Home / Self Care | Payer: Medicare Other | Source: Ambulatory Visit | Attending: Family Medicine | Admitting: Family Medicine

## 2021-02-23 DIAGNOSIS — R928 Other abnormal and inconclusive findings on diagnostic imaging of breast: Secondary | ICD-10-CM

## 2021-02-23 NOTE — Chronic Care Management (AMB) (Addendum)
Chronic Care Management    Clinical Social Work Note  02/23/2021 Name: Donna Patrick MRN: [id number] DOB: 10/31/1950  Donna Patrick is a 70 y.o. year old female who is a primary care patient of Koberlein, [name], MD. The CCM team was consulted to assist the patient with chronic disease management and/or care coordination needs related to: [employer] and Level of Care Concerns.   Engaged with patient and daughter-in-law by telephone for initial visit in response to provider referral for social work chronic care management and care coordination services.   Consent to Services:  The patient was given information about Chronic Care Management services, agreed to services, and gave verbal consent prior to initiation of services.  Please see initial visit note for detailed documentation.   Patient and daughter-in-law agreed to services and consent obtained.   Assessment: Review of patient past medical history, allergies, medications, and health status, including review of relevant consultants reports was performed today as part of a comprehensive evaluation and provision of chronic care management and care coordination services.     SDOH (Social Determinants of Health) assessments and interventions performed:    Advanced Directives Status: See Care Plan for related entries.  CCM Care Plan  No Known Allergies  Outpatient Encounter Medications as of 02/22/2021  Medication Sig   cholecalciferol (VITAMIN D3) 25 MCG (1000 UNIT) tablet Take 2,000 Units by mouth daily.   Cyanocobalamin 5000 MCG SUBL Place under the tongue daily.   donepezil (ARICEPT) 5 MG tablet Take 1 tablet (5 mg total) by mouth at bedtime.   folic acid (FOLVITE) 1 MG tablet Take 1 tablet (1 mg total) by mouth daily.   levothyroxine (SYNTHROID) 88 MCG tablet Take 1 tablet (88 mcg total) by mouth daily before breakfast.   LORazepam (ATIVAN) 0.5 MG tablet Take 1-2 tablets PO 30-60 minutes prior to procedure.    memantine (NAMENDA) 10 MG tablet Take 1 tablet (10 mg total) by mouth 2 (two) times daily.   oxybutynin (DITROPAN-XL) 10 MG 24 hr tablet Take 1 tablet (10 mg total) by mouth daily.   risperiDONE (RISPERDAL) 0.5 MG tablet Take 1 tablet (0.5 mg total) by mouth 2 (two) times daily.   simvastatin (ZOCOR) 40 MG tablet Take 1 tablet (40 mg total) by mouth daily.   triamcinolone ointment (KENALOG) 0.1 % SMARTSIG:Sparingly Topical Twice Daily   trospium (SANCTURA) 20 MG tablet Take 1 tablet (20 mg total) by mouth 2 (two) times daily.   No facility-administered encounter medications on file as of 02/22/2021.    Patient Active Problem List   Diagnosis Date Noted   Agitation 01/09/2021   Hypothyroid 12/15/2020   B12 deficiency 12/15/2020   Hyperlipidemia 10/25/2020   Overactive bladder 10/25/2020   Dementia with behavioral disturbance (HCC) 10/25/2020    Conditions to be addressed/monitored: Dementia. Level of Care Concerns, ADL/IADL Limitations, Mental Health Concerns, Cognitive Deficits, Memory Deficits, Caregiver Stress and Lacks Knowledge of [employer].  Care Plan : LCSW Plan of Care  Updates made by Karolee Stamps, LCSW since 02/23/2021 12:00 AM     Problem: Maintain My Quality of Life through [facility] and Aide Services.   Priority: High     Goal: Maintain My Quality of Life through [facility] and Aide Services.   Start Date: 02/22/2021  Expected End Date: 03/26/2021  This Visit's Progress: On track  Priority: High  Note:   Current Barriers:   Patient with Dementia with Behavioral Disturbance, Agitation, Hyperlipidemia and Caregiver Stress needs  Support, Education, and Care Coordination to resolve unmet personal care needs in the home. Patient is unable to self-administer medications as prescribed. Patient is unable to consistently perform ADL's/IADL's independently. Financial constraints related to inability to pay for in-home care services out-of-pocket. Patient is not  eligible to receive funding for in-home care services through Adult Medicaid with the [facility] of Social Services, due to exceeding income guidelines. Level of care concerns, but family refuses to place patient into an assisted living facility or skilled nursing facility or pursue Adult Day Care Programs, of any kind. Lacks knowledge of available community agencies and resources. Clinical Goals:  Over the next 30 to 45 days, patient will have in-home care services in place, either through Juventino Slovak, Child psychotherapist II with the [employer] of Health and [employer], or through a private agency of choice. Patient and daughter-in-law will work with LCSW and Juventino Slovak, Social Worker II with the [employer] of Health and [employer], to coordinate care for in-home aide services. Patient and daughter-in-law will receive, review and consider arranging in-home care services through a private agency of choice, from the list mailed to them by LCSW, and notify LCSW if they need assistance with the referral process.   Patient will attend all scheduled medical appointments as evidenced by patient report and care team review of appointment completion in electronic medical record. Patient will demonstrate improved health management independence as evidenced by having in-home care services in place. Clinical Interventions: Patient and daughter-in-law interviewed and appropriate assessments performed. Collaboration with patient and daughter-in-law regarding development and update of comprehensive plan of care as evidenced by provider attestation and co-signature. Inter-disciplinary care team collaboration (see longitudinal plan of care). Interventions performed:  Problem Solving/Task Centered, Psychoeducation/Health Education, Quality of Sleep Assessed and Sleep Hygiene Techniques Promoted, Caregiver Stress  Acknowledged and Consideration of In-Home Care Services Encouraged. Referral placed to Juventino Slovak, Social Worker II with the [facility] Department of Health and [employer] In-Home Aide Program. Explained to patient and daughter-in-law that applications for the [employer] are screened by a Child psychotherapist, through the [employer] of Health and [employer], over the phone, and that client's are assessed at 3 different levels to determine what their needs are, whether home management or personal care services.   LCSW further explained to patient and daughter-in-law, that based on the level they are assessed, they can receive between 4-10 hours of in-home care services per week and that the number of hours determines the number of days they will receive services during that week.  Individuals can be disabled or aged, but cannot receive Adult Medicaid, Veteran Aide and Attendance Benefits, or any other services that would qualify them where in-home aide services are available.   Discussed plans with patient and daughter-in-law for ongoing care management follow-up and provided direct contact information for care management team. Assisted patient and daughter-in-law with obtaining information about health plan benefits through Traditional Medicare and [employer]. Provided education to patient and daughter-in-law regarding level of care options. Assessed needs, level of care concerns, basic eligibility and provided education on [employer] process. LCSW collaboration with Juventino Slovak, Social Worker II with the [facility] Department of Health and [employer], to verify application is received and processed. Identified resources and durable medical equipment needed in the home to improve safety and promote independence. Patient Goals/Self-Care Activities:  Follow-up with Threasa Alpha  [place], Child psychotherapist II with the [employer] of Health and [employer], to check the status of your application process. Work with [employer] on a weekly basis until approved for [employer], or until in-home care services are in place through a private agency of choice. Review list of private agencies providing in-home care services, mailed to you by LCSW. Remember: Always use handrails on the stairs. Always use your cane or walker when ambulating. Always wear your glasses, especially at night or in areas not well lit.  Follow Up Plan: LCSW will follow-up with patient and patient's daughter-in-law by telephone on 03/08/2021 at 9:00am.      Follow Up Plan:  LCSW will follow-up with patient and daughter-in-law by phone on 03/08/2021 at 9:00am.  Danford Bad LCSW Licensed Clinical Social Worker [facility] [phone]

## 2021-02-23 NOTE — Patient Instructions (Signed)
Visit Information   PATIENT GOALS:   Goals Addressed             This Visit's Progress    Maintain My Quality of Life through [facility] and Aide Services.   On track    Timeframe:  Short-Term Goal Priority:  High Start Date:     02/22/2021                        Expected End Date:   03/26/2021                Follow-Up Date:  03/08/2021 at 9:00am.   Patient Goals/Self-Care Activities:  Follow-up with Rosealee Albee, Social Worker II with the [place] Program, to check the status of your application process. Work with [employer] on a weekly basis until approved for [employer], or until in-home care services are in place through a private agency of choice. Review list of private agencies providing in-home care services, mailed to you by LCSW. Remember: Always use handrails on the stairs. Always use your cane or walker when ambulating. Always wear your glasses, especially at night or in areas not well lit.          Consent to CCM Services: Ms. Westerhoff was given information about Chronic Care Management services today including:  CCM service includes personalized support from designated clinical staff supervised by her physician, including individualized plan of care and coordination with other care providers 24/7 contact phone numbers for assistance for urgent and routine care needs. Service will only be billed when office clinical staff spend 20 minutes or more in a month to coordinate care. Only one practitioner may furnish and bill the service in a calendar month. The patient may stop CCM services at any time (effective at the end of the month) by phone call to the office staff. The patient will be responsible for cost sharing (co-pay) of up to 20% of the service fee (after annual deductible is met).  Patient agreed to services and verbal consent obtained.   Patient verbalizes understanding of instructions  provided today and agrees to view in [place].   Telephone follow-up appointment with care management team member scheduled for:  03/08/2021 at [place] LCSW Licensed Clinical Social Worker [facility] [phone]    CLINICAL CARE PLAN: Patient Care Plan: LCSW Plan of Care     Problem Identified: Maintain My Quality of Life through [place] and Aide Services.   Priority: High     Goal: Maintain My Quality of Life through [facility] and Aide Services.   Start Date: 02/22/2021  Expected End Date: 03/26/2021  This Visit's Progress: On track  Priority: High  Note:   Current Barriers:   Patient with Dementia with Behavioral Disturbance, Agitation, Hyperlipidemia and Caregiver Stress needs Support, Education, and Care Coordination to resolve unmet personal care needs in the home. Patient is unable to self-administer medications as prescribed. Patient is unable to consistently perform ADL's/IADL's independently. Financial constraints related to inability to pay for in-home care services out-of-pocket. Patient is not eligible to receive funding for in-home care services through Adult Medicaid with the [place], due to exceeding income guidelines. Level of care concerns, but family refuses to place patient into an assisted living facility or skilled nursing facility or pursue Adult Day Care Programs, of any kind. Lacks knowledge of available community agencies and resources.  Clinical Goals:  Over the next 30 to 45 days, patient will have in-home care services in place, either through Rosealee Albee, Education officer, museum II with the [place] Program, or through a private agency of choice. Patient and daughter-in-law will work with LCSW and Rosealee Albee, Social Worker II with the [place] Program, to coordinate care for  in-home aide services. Patient and daughter-in-law will receive, review and consider arranging in-home care services through a private agency of choice, from the list mailed to them by LCSW, and notify LCSW if they need assistance with the referral process.   Patient will attend all scheduled medical appointments as evidenced by patient report and care team review of appointment completion in electronic medical record. Patient will demonstrate improved health management independence as evidenced by having in-home care services in place. Clinical Interventions: Patient and daughter-in-law interviewed and appropriate assessments performed. Collaboration with patient and daughter-in-law regarding development and update of comprehensive plan of care as evidenced by provider attestation and co-signature. Inter-disciplinary care team collaboration (see longitudinal plan of care). Interventions performed:  Problem Solving/Task Centered, Psychoeducation/Health Education, Quality of Sleep Assessed and Sleep Hygiene Techniques Promoted, Caregiver Stress Acknowledged and Consideration of In-Home Care Services Encouraged. Referral placed to Rosealee Albee, Social Worker II with the [employer]. Explained to patient and daughter-in-law that applications for the [employer] are screened by a Education officer, museum, through the [place], over the phone, and that client's are assessed at 3 different levels to determine what their needs are, whether home management or personal care services.   LCSW further explained to patient and daughter-in-law, that based on the level they are assessed, they can receive between 4-10 hours of in-home care services per week and that the number of hours determines the number of days they will receive services during that week.  Individuals can be disabled or aged, but cannot  receive Adult Medicaid, Veteran Aide and Attendance Benefits, or any other services that would qualify them where in-home aide services are available.   Discussed plans with patient and daughter-in-law for ongoing care management follow-up and provided direct contact information for care management team. Assisted patient and daughter-in-law with obtaining information about health plan benefits through Traditional Medicare and [employer]. Provided education to patient and daughter-in-law regarding level of care options. Assessed needs, level of care concerns, basic eligibility and provided education on [employer] process. LCSW collaboration with Rosealee Albee, Social Worker II with the [place] Program, to verify application is received and processed. Identified resources and durable medical equipment needed in the home to improve safety and promote independence. Patient Goals/Self-Care Activities:  Follow-up with Rosealee Albee, Social Worker II with the [place] Program, to check the status of your application process. Work with [employer] on a weekly basis until approved for [employer], or until in-home care services are in place through a private agency of choice. Review list of private agencies providing in-home care services, mailed to you by LCSW. Remember: Always use handrails on the stairs. Always use your cane or walker when ambulating. Always wear your glasses, especially at night or in areas not well lit.  Follow Up Plan: LCSW will follow-up with patient and patient's  daughter-in-law by telephone on 03/08/2021 at 9:00am.

## 2021-02-26 ENCOUNTER — Other Ambulatory Visit: Payer: Self-pay

## 2021-02-26 ENCOUNTER — Ambulatory Visit: Payer: Medicare Other | Admitting: Podiatry

## 2021-02-26 ENCOUNTER — Encounter: Payer: Self-pay | Admitting: Family Medicine

## 2021-02-26 ENCOUNTER — Ambulatory Visit (INDEPENDENT_AMBULATORY_CARE_PROVIDER_SITE_OTHER): Payer: Medicare Other | Admitting: Family Medicine

## 2021-02-26 VITALS — BP 142/82 | HR 86 | Temp 98.4°F | Ht 65.0 in | Wt 144.1 lb

## 2021-02-26 DIAGNOSIS — E785 Hyperlipidemia, unspecified: Secondary | ICD-10-CM | POA: Diagnosis not present

## 2021-02-26 DIAGNOSIS — R350 Frequency of micturition: Secondary | ICD-10-CM

## 2021-02-26 DIAGNOSIS — E538 Deficiency of other specified B group vitamins: Secondary | ICD-10-CM | POA: Diagnosis not present

## 2021-02-26 DIAGNOSIS — G309 Alzheimer's disease, unspecified: Secondary | ICD-10-CM

## 2021-02-26 DIAGNOSIS — J3489 Other specified disorders of nose and nasal sinuses: Secondary | ICD-10-CM

## 2021-02-26 DIAGNOSIS — N3281 Overactive bladder: Secondary | ICD-10-CM | POA: Diagnosis not present

## 2021-02-26 DIAGNOSIS — F0281 Dementia in other diseases classified elsewhere with behavioral disturbance: Secondary | ICD-10-CM

## 2021-02-26 DIAGNOSIS — E039 Hypothyroidism, unspecified: Secondary | ICD-10-CM

## 2021-02-26 LAB — FOLATE: Folate: >24.4 ng/mL (ref >5.9)

## 2021-02-26 LAB — VITAMIN B12: Vitamin B-12: >1550 pg/mL — ABNORMAL HIGH (ref 211–911)

## 2021-02-26 NOTE — Progress Notes (Signed)
Donna Patrick DOB: 1951/08/02 Encounter date: 02/26/2021  This is a 70 y.o. female who presents with Chief Complaint  Patient presents with   Follow-up    Patient questioned if she should continue taking the vitamin B12 supplement    History of present illness:  Daughter is here with her today. Still picking on skin. Daughter states she picked her up this morning about 5 hours ago and she has urinated 12 times already.   Patient/daughter-in-law met with chronic care management yesterday and discussed follow-up with [place] in home aide program.   Patient did follow-up on previous abnormal mammogram and follow-up imaging was found to be benign.  No repeat mammogram needed until 02/2022.  With progression of dementia, uncertain she will need to repeat this.  Last visit with me was virtual on 02/02/2021.  We discussed at that time difficulties that daughter-in-law was having with trying to manage progressive dementia with patient including sleep disruption, agitation.  Last visit, and due to significant agitation, I did suggest increasing Risperdal to 1 tab in the morning 2 in the evening.  She is following with neurology. Daughter isn't sure if this has helped. She does go to sleep ok 8-8:30 for daughter and is up very early. Stays with daughter one weekend a month and then 1 day/week.   Urinary frequency: Oxybutynin 10 mg daily. Hyperlipidemia: Simvastatin 40 mg daily. B12 deficiency: She is supplementing with folic acid 1 mg daily, and B12 5000 units daily. Vitamin D deficiency: 2000 units supplement daily. Hypothyroid: Synthroid 88 mcg daily.  No Known Allergies Current Meds  Medication Sig   cholecalciferol (VITAMIN D3) 25 MCG (1000 UNIT) tablet Take 2,000 Units by mouth daily.   Cyanocobalamin 5000 MCG SUBL Place under the tongue daily.   donepezil (ARICEPT) 5 MG tablet Take 1 tablet (5 mg total) by mouth at bedtime.   folic acid  (FOLVITE) 1 MG tablet Take 1 tablet (1 mg total) by mouth daily.   levothyroxine (SYNTHROID) 88 MCG tablet Take 1 tablet (88 mcg total) by mouth daily before breakfast.   LORazepam (ATIVAN) 0.5 MG tablet Take 1-2 tablets PO 30-60 minutes prior to procedure.   memantine (NAMENDA) 10 MG tablet Take 1 tablet (10 mg total) by mouth 2 (two) times daily.   oxybutynin (DITROPAN-XL) 10 MG 24 hr tablet Take 1 tablet (10 mg total) by mouth daily.   risperiDONE (RISPERDAL) 0.5 MG tablet Take 1 tablet (0.5 mg total) by mouth 2 (two) times daily.   simvastatin (ZOCOR) 40 MG tablet Take 1 tablet (40 mg total) by mouth daily.   triamcinolone ointment (KENALOG) 0.1 % SMARTSIG:Sparingly Topical Twice Daily   trospium (SANCTURA) 20 MG tablet Take 1 tablet (20 mg total) by mouth 2 (two) times daily.    Review of Systems  Constitutional:  Negative for chills, fatigue and fever.  Respiratory:  Negative for cough, chest tightness, shortness of breath and wheezing.   Cardiovascular:  Negative for chest pain, palpitations and leg swelling.  Skin:        Still with skin picking per daughter  Psychiatric/Behavioral:  Sleep disturbance: daughter does not feel sleep is significant issue. The patient is not nervous/anxious.    Objective:  BP (!) 142/82 (BP Location: Left Arm, Patient Position: Sitting, Cuff Size: Normal)   Pulse 86   Temp 98.4 F (36.9 C) (Oral)   Ht $R'5\' 5"'Te$  (1.651 m)   Wt 144 lb 1.6 oz (65.4 kg)  SpO2 98%   BMI 23.98 kg/m   Weight: 144 lb 1.6 oz (65.4 kg)   BP Readings from Last 3 Encounters:  02/26/21 (!) 142/82  02/02/21 128/78  01/09/21 128/70   Wt Readings from Last 3 Encounters:  02/26/21 144 lb 1.6 oz (65.4 kg)  01/09/21 142 lb (64.4 kg)  01/03/21 140 lb (63.5 kg)    Physical Exam Constitutional:      General: She is not in acute distress.    Appearance: She is well-developed.  HENT:     Nose:     Comments: Dry mucous membranes bilateral.  And left nares, there is a tissue  enlargement, somewhat difficult to appreciate secondary to scabbing.  Cardiovascular:     Rate and Rhythm: Normal rate and regular rhythm.     Heart sounds: Normal heart sounds. No murmur heard.   No friction rub.  Pulmonary:     Effort: Pulmonary effort is normal. No respiratory distress.     Breath sounds: Normal breath sounds. No wheezing or rales.  Musculoskeletal:     Right lower leg: No edema.     Left lower leg: No edema.  Neurological:     Mental Status: She is alert and oriented to person, place, and time.  Psychiatric:        Behavior: Behavior normal.     Assessment/Plan  1. Hypothyroidism, unspecified type Continue with Synthroid 88 mcg daily.  2. Alzheimer's dementia with behavioral disturbance, unspecified timing of dementia onset [facility]) She is following regularly with neurology.  Currently on Aricept 5 mg, Namenda 10 mg twice daily.  She is taking Resporal 1 tablet p.o. twice daily; daughter is not certain if they have increased dose of this in the evening.  She is quite calm in the office, and daughter with her today has not experienced the same kind of agitation and repetitive behavior that was previously discussed with patient's daughter-in-law.  However, patient lives with son and daughter-in-law, so daughter's exposure is significantly less to patient.  Discussed with daughter that is patient's condition progresses, we may need to reassess medication doses.  Certainly trying to redirect and comfort without medications is preferred, but if agitation worsens, let us know.  3. Overactive bladder Very frequent urination and difficulty with going to the bathroom after urgency.  We will check urine today for infection.  She has restarted oxybutynin 10 mg daily.  4. Hyperlipidemia, unspecified hyperlipidemia type Continue with simvastatin 40 mg daily.  5. B12 deficiency Has been supplementing B12.  Prior to moving here was not eating a very well-rounded diet.  May be able  to cut back on supplementation pending today's blood work. - Vitamin B12; Future - Vitamin B12  6. Folate deficiency See above. - Folate; Future - Folate  7. Urinary frequency - Urine Culture; Future  8. Nasal lesion Patient was previously doing a lot of skin picking.  All skin lesions that previously were open on back on abdomen, face have healed.  Current nasal lesion is actually within the nares.  Uncertain if lesion that is visible is related to patient's picking or if there is an underlying lesion present.  Discussed with daughter need to moisturize nasal mucosa.  If still crusting or irritated after 2 weeks of nasal saline gel and regular use of Vaseline or Aquaphor in the nose, may need to see ENT for further evaluation.  Return in about 3 months (around 05/29/2021).      Micheline Rough, MD

## 2021-02-26 NOTE — Patient Instructions (Addendum)
*  if able; consider nasal saline (gel) inhaled 1-2 times daily to help moisturize nasal membrane. If nasal spray is not possible; keep moisturized with aquaphor or vasoline into left side of nose using qtip.   If bleeding/irritation is not better within 2 week of starting this; let me know.   *let me know if any remaining issues with getting home care out to the house.   *check blood pressures at home; let me know if regularly over 135/85 (either number)

## 2021-02-27 ENCOUNTER — Telehealth: Payer: Self-pay | Admitting: Family Medicine

## 2021-02-27 DIAGNOSIS — R350 Frequency of micturition: Secondary | ICD-10-CM

## 2021-02-27 NOTE — Telephone Encounter (Signed)
Patient is still having urination issues and she wants to know if her bladder medication needs to be increased. She is having more urinary accidents then she is telling. Maneak Haston had found under wear that the patient has been stashing in her room and she there is spots in the patients room that is puddles of urine. She wants to help her mother in law to still stay a women and keep her dignity and get her some help from urinating all over herself.   Please advise

## 2021-02-28 ENCOUNTER — Encounter: Payer: Self-pay | Admitting: *Deleted

## 2021-02-28 NOTE — Addendum Note (Signed)
Addended by: Johnella Moloney on: 02/28/2021 10:52 AM   Modules accepted: Orders

## 2021-02-28 NOTE — Telephone Encounter (Signed)
Spoke with the pts daughter in law, informed her of the message below and scheduled a lab appt for 7/14.  Order for urinalysis added as below.

## 2021-03-01 ENCOUNTER — Other Ambulatory Visit: Payer: Self-pay

## 2021-03-01 ENCOUNTER — Other Ambulatory Visit (INDEPENDENT_AMBULATORY_CARE_PROVIDER_SITE_OTHER): Payer: Medicare Other

## 2021-03-01 DIAGNOSIS — R350 Frequency of micturition: Secondary | ICD-10-CM | POA: Diagnosis not present

## 2021-03-01 NOTE — Telephone Encounter (Signed)
The patient came in today and was not able to leave a UA and she was sent home with a urine cup. She still hasn't used the bathroom but she's been staying in the bathroom and saying that she peed.  The daughter in law also stated that they were sent home with a culp card and the patient has dementia an alzheimer's and there is now way they can do that at home.  The daughter in law is wanting to know if she needs to be refer her to a specialist.  Please advise

## 2021-03-02 LAB — URINALYSIS
Bilirubin Urine: NEGATIVE
Hgb urine dipstick: NEGATIVE
Ketones, ur: NEGATIVE
Leukocytes,Ua: NEGATIVE
Nitrite: NEGATIVE
Specific Gravity, Urine: 1.01 (ref 1.000–1.030)
Total Protein, Urine: NEGATIVE
Urine Glucose: NEGATIVE
Urobilinogen, UA: 0.2 (ref 0.0–1.0)
pH: 7.5 (ref 5.0–8.0)

## 2021-03-02 NOTE — Telephone Encounter (Signed)
Spoke with the pts daughter-in-law and informed her pf the message below.  She stated since she spoke with me, the pt has urinated and she bought the specimen to our office for testing and I informed her she will be contacted once the results are received. Message sent to PCP.

## 2021-03-02 NOTE — Telephone Encounter (Signed)
Spoke with the patients daughter in law and informed her of the message below.  She stated the pt was given a hat and cup, has not urinated since yesterday, despite drinking fluids and eating.  I advised her the pt should go to the ER as there is nothing we can do regarding this in our office and she stated she thought the same thing as the family members do not see this as a problem.  Message sent to PCP as the patients daughter in law would like to know what Dr Hassan Rowan recommended.

## 2021-03-03 LAB — URINE CULTURE
MICRO NUMBER:: 12125007
SPECIMEN QUALITY:: ADEQUATE

## 2021-03-05 ENCOUNTER — Other Ambulatory Visit: Payer: Self-pay

## 2021-03-05 ENCOUNTER — Encounter: Payer: Self-pay | Admitting: Family Medicine

## 2021-03-05 ENCOUNTER — Ambulatory Visit (INDEPENDENT_AMBULATORY_CARE_PROVIDER_SITE_OTHER): Payer: Medicare Other | Admitting: Family Medicine

## 2021-03-05 VITALS — BP 132/80 | HR 91 | Temp 98.1°F | Ht 65.0 in | Wt 144.6 lb

## 2021-03-05 DIAGNOSIS — R350 Frequency of micturition: Secondary | ICD-10-CM

## 2021-03-05 DIAGNOSIS — K5900 Constipation, unspecified: Secondary | ICD-10-CM | POA: Diagnosis not present

## 2021-03-05 DIAGNOSIS — R32 Unspecified urinary incontinence: Secondary | ICD-10-CM | POA: Diagnosis not present

## 2021-03-05 NOTE — Progress Notes (Signed)
Donna Patrick DOB: 04-Feb-1951 Encounter date: 03/05/2021  This is a 70 y.o. female who presents with Chief Complaint  Patient presents with   Urinary Frequency    History of present illness:  Going to the bathroom 60+ times in a day; urgency. Not urinating each time. Sometimes says doesn't have to go and will have accident - when this happens seems like full bladder empties. Drinking a lot of tea (per daughter in law over a gallon a day) and eating very well: 3 eggs and 3-4 pieces of bacon every day, snacks, and 3 other full meals and not having regular bowel movements. Was eating 6 meals a day; now 4 meals. She is eating well; portions are good. Denies discomfort with urination.   Last week she was saying that belly hurt and she could not pee. Was saying she had bm; but she didn't. Definitely not daily bm. She is urinating and having full bladder emptying multiple times/day.   She is very busy through day (and night); changing clothes frequently. Walking about 5 miles/day just constantly up and doing things around house.   No Known Allergies Current Meds  Medication Sig   cholecalciferol (VITAMIN D3) 25 MCG (1000 UNIT) tablet Take 2,000 Units by mouth daily.   Cyanocobalamin 5000 MCG SUBL Place under the tongue 2 (two) times a week.   donepezil (ARICEPT) 5 MG tablet Take 1 tablet (5 mg total) by mouth at bedtime.   levothyroxine (SYNTHROID) 88 MCG tablet Take 1 tablet (88 mcg total) by mouth daily before breakfast.   LORazepam (ATIVAN) 0.5 MG tablet Take 1-2 tablets PO 30-60 minutes prior to procedure.   memantine (NAMENDA) 10 MG tablet Take 1 tablet (10 mg total) by mouth 2 (two) times daily.   oxybutynin (DITROPAN-XL) 10 MG 24 hr tablet Take 1 tablet (10 mg total) by mouth daily.   risperiDONE (RISPERDAL) 0.5 MG tablet Take 1 tablet (0.5 mg total) by mouth 2 (two) times daily.   simvastatin (ZOCOR) 40 MG tablet Take 1 tablet (40 mg total) by mouth daily.   triamcinolone ointment  (KENALOG) 0.1 % SMARTSIG:Sparingly Topical Twice Daily   trospium (SANCTURA) 20 MG tablet Take 1 tablet (20 mg total) by mouth 2 (two) times daily.   [DISCONTINUED] folic acid (FOLVITE) 1 MG tablet Take 1 tablet (1 mg total) by mouth daily.    Review of Systems  Constitutional:  Negative for chills, fatigue and fever.  Respiratory:  Negative for cough, chest tightness, shortness of breath and wheezing.   Cardiovascular:  Negative for chest pain, palpitations and leg swelling.  Gastrointestinal:  Positive for abdominal pain (noted last week with difficulty urinating) and constipation. Negative for blood in stool, diarrhea and vomiting.  Neurological:  Negative for weakness and headaches.  Psychiatric/Behavioral:  Positive for agitation (has a hard time going other places/with other people), confusion, decreased concentration and sleep disturbance (not complaining of sleep issues; but is up every night). The patient is hyperactive (very active; see hpi).    Objective:  BP 132/80 (BP Location: Left Arm, Patient Position: Sitting, Cuff Size: Normal)   Pulse 91   Temp 98.1 F (36.7 C) (Oral)   Ht 5\' 5"  (1.651 m)   Wt 144 lb 9.6 oz (65.6 kg)   SpO2 98%   BMI 24.06 kg/m   Weight: 144 lb 9.6 oz (65.6 kg)   BP Readings from Last 3 Encounters:  03/05/21 132/80  02/26/21 (!) 142/82  02/02/21 128/78   Wt Readings from Last  3 Encounters:  03/05/21 144 lb 9.6 oz (65.6 kg)  02/26/21 144 lb 1.6 oz (65.4 kg)  01/09/21 142 lb (64.4 kg)    Physical Exam Constitutional:      General: She is not in acute distress.    Appearance: She is well-developed.  Cardiovascular:     Rate and Rhythm: Normal rate and regular rhythm.     Heart sounds: Normal heart sounds. No murmur heard.   No friction rub.  Pulmonary:     Effort: Pulmonary effort is normal. No respiratory distress.     Breath sounds: Normal breath sounds. No wheezing or rales.  Abdominal:     General: Bowel sounds are normal. There is  no distension.     Palpations: Abdomen is soft. There is no mass.     Tenderness: There is no abdominal tenderness. There is no guarding.     Comments: Does feel like there is stool in abdomen although non tender, non distended  Genitourinary:    General: Normal vulva.     Exam position: Supine.     Labia:        Right: No rash or tenderness.        Left: No rash or tenderness.      Urethra: No prolapse, urethral swelling or urethral lesion.     Vagina: No vaginal discharge.     Uterus: Absent.      Rectum: Normal. Guaiac result negative. No mass or tenderness.     Comments: + cystocele on exam grade 1-2 Musculoskeletal:     Right lower leg: No edema.     Left lower leg: No edema.  Neurological:     Mental Status: She is alert and oriented to person, place, and time.  Psychiatric:        Behavior: Behavior normal.    Assessment/Plan 1. Urinary frequency Referring to urology. UA from last week normal; culture just skin flora.  We discussed limiting foods and drinks that may aggravate the bladder.  She does drink a lot of tea and uses a lot of dressing, but I am not certain that this is the main source of her urinary frequency.  I am hesitant to change her bladder medication for fear of causing worse retention. - Ambulatory referral to Urology  2. Constipation, unspecified constipation type I do feel the constipation may be affecting her urinary frequency.  We discussed working on more regular bowel movements by first increasing fiber intake via supplement (Citrucel or Metamucil) and if that is not working adding MiraLAX.  3. Urinary incontinence, unspecified type See above. - Ambulatory referral to Urology  I have copied social work on this note as daughter-in-law is still trying to manage full-time caregiving as well as her own job. [name] doesn't qualify for home health aid through insurance.    Return in about 3 months (around 06/05/2021) for Chronic condition  visit.    Theodis Shove, MD

## 2021-03-05 NOTE — Telephone Encounter (Signed)
Noted  

## 2021-03-05 NOTE — Patient Instructions (Signed)
*  consider citrucel daily (or metamucil) to work on daily bowel movements. If not going regularly (daily or having very good, complete BM every other day) then could try miralax daily or every other day to keep those consistent.

## 2021-03-06 ENCOUNTER — Telehealth: Payer: Self-pay | Admitting: Family Medicine

## 2021-03-06 NOTE — Telephone Encounter (Signed)
Patient's daughter in law Gabriel Rung called because she had an appointment with Dr. Hassan Rowan yesterday and she asked about patient's sleeping habits. Gabriel Rung says that the patient's time release pills are not working, because the patient is up at all times throughout the night.  Gabriel Rung is requesting a call back at [phone].  Please advise.

## 2021-03-07 ENCOUNTER — Other Ambulatory Visit: Payer: Self-pay | Admitting: Family Medicine

## 2021-03-07 MED ORDER — TRAZODONE HCL 50 MG PO TABS: 25.0000 mg | ORAL_TABLET | Freq: Every evening | ORAL | 3 refills | Status: DC | PRN

## 2021-03-07 NOTE — Telephone Encounter (Signed)
Are we able to get her in with urology more urgently? I hate to send her to urgent care or ER for not being able to urinate. Since this message is from the morning; I am hoping that she has gone since then and if so appointment would be less urgent; but this seems to be recurring so would be nice to have within the week. If she has not gone to the bathroom today and is drinking her normal amount, she needs to go somewhere they can empty the bladder for her. Unfortunately we do not have equipment here for that. Urgent care may be able to do that depending on provider; otherwise would recommend Er ([facility] may be good option)  With regards to sleep, let me do a quick review and I will send second message.

## 2021-03-07 NOTE — Telephone Encounter (Signed)
Spoke with Donna Patrick. We are going to try trazodone 50mg  qhs. If no improvement can increase to 100mg  at bedtime. Cut off tea at lunch time (she is drinking decaf but we talked about this still affecting bladder, urinary freq and potentially sleep. She will let me know how this does/doesn't work. Discussed new medication(s) today with patient. Discussed potential side effects and patient verbalized understanding.

## 2021-03-07 NOTE — Telephone Encounter (Signed)
With regards to sleep - I don't think she has been on anything from [place] for sleep in the past. Anything that monique is aware she took for sleep? I do think that it is reasonable to check with neurology about recommendations on this front. When I spoke with her in past, I suggested increase in the risperdal to 2 tabs at bedtime. If she did this; did it help? Adding other sleep meds could affect confusion/agitation, so I think wise to check on neurology recommendations, but if she didn't already try increase to 2 at bedtime I would start there. If she has already done this without benefit let me know.

## 2021-03-07 NOTE — Telephone Encounter (Signed)
Gabriel Rung (daughter in law) called to let Dr. Hassan Rowan that after she as been seen pon Monday that the patient can not pee now.   Please advise

## 2021-03-07 NOTE — Telephone Encounter (Signed)
Spoke with the patients daughter in law, informed her of the messages below, she stated the pt has urinated today and is drinking fluids.  Stated the pt has an appt with urology on July 25th.  She also stated the pt has not tried any another sleeping medications previously and has tried taking 2 Risperdal tablets with no relief.  Message sent to PCP.

## 2021-03-07 NOTE — Telephone Encounter (Signed)
Message sent to PCP as per Gavin Pound, an urgent referral would need to be entered.

## 2021-03-08 ENCOUNTER — Ambulatory Visit: Payer: Medicare Other | Admitting: *Deleted

## 2021-03-08 DIAGNOSIS — E785 Hyperlipidemia, unspecified: Secondary | ICD-10-CM

## 2021-03-08 DIAGNOSIS — F0281 Dementia in other diseases classified elsewhere with behavioral disturbance: Secondary | ICD-10-CM

## 2021-03-08 DIAGNOSIS — F0280 Dementia in other diseases classified elsewhere without behavioral disturbance: Secondary | ICD-10-CM

## 2021-03-08 DIAGNOSIS — G309 Alzheimer's disease, unspecified: Secondary | ICD-10-CM

## 2021-03-08 DIAGNOSIS — E039 Hypothyroidism, unspecified: Secondary | ICD-10-CM

## 2021-03-08 NOTE — Chronic Care Management (AMB) (Signed)
Chronic Care Management    Clinical Social Work Note  03/08/2021 Name: Donna Patrick MRN: [id number] DOB: 08/11/1951  Donna Patrick is a 70 y.o. year old female who is a primary care patient of Koberlein, [name], MD. The CCM team was consulted to assist the patient with chronic disease management and/or care coordination needs related to: [employer], Level of Care Concerns, and Caregiver Stress.   Engaged with patient and daughter-in-law by telephone for follow up visit in response to provider referral for social work chronic care management and care coordination services.   Consent to Services:  The patient was given information about Chronic Care Management services, agreed to services, and gave verbal consent prior to initiation of services.  Please see initial visit note for detailed documentation.   Patient agreed to services and consent obtained.   Assessment: Review of patient past medical history, allergies, medications, and health status, including review of relevant consultants reports was performed today as part of a comprehensive evaluation and provision of chronic care management and care coordination services.     SDOH (Social Determinants of Health) assessments and interventions performed:    Advanced Directives Status: Not addressed in this encounter.  CCM Care Plan  No Known Allergies  Outpatient Encounter Medications as of 03/08/2021  Medication Sig   cholecalciferol (VITAMIN D3) 25 MCG (1000 UNIT) tablet Take 2,000 Units by mouth daily.   Cyanocobalamin 5000 MCG SUBL Place under the tongue 2 (two) times a week.   donepezil (ARICEPT) 5 MG tablet Take 1 tablet (5 mg total) by mouth at bedtime.   levothyroxine (SYNTHROID) 88 MCG tablet Take 1 tablet (88 mcg total) by mouth daily before breakfast.   LORazepam (ATIVAN) 0.5 MG tablet Take 1-2 tablets PO 30-60 minutes prior to procedure.   memantine (NAMENDA) 10 MG tablet Take 1 tablet (10 mg total) by mouth 2  (two) times daily.   oxybutynin (DITROPAN-XL) 10 MG 24 hr tablet Take 1 tablet (10 mg total) by mouth daily.   risperiDONE (RISPERDAL) 0.5 MG tablet Take 1 tablet (0.5 mg total) by mouth 2 (two) times daily.   simvastatin (ZOCOR) 40 MG tablet Take 1 tablet (40 mg total) by mouth daily.   traZODone (DESYREL) 50 MG tablet Take 0.5-1 tablets (25-50 mg total) by mouth at bedtime as needed for sleep.   triamcinolone ointment (KENALOG) 0.1 % SMARTSIG:Sparingly Topical Twice Daily   trospium (SANCTURA) 20 MG tablet Take 1 tablet (20 mg total) by mouth 2 (two) times daily.   No facility-administered encounter medications on file as of 03/08/2021.    Patient Active Problem List   Diagnosis Date Noted   Agitation 01/09/2021   Hypothyroid 12/15/2020   B12 deficiency 12/15/2020   Hyperlipidemia 10/25/2020   Overactive bladder 10/25/2020   Dementia with behavioral disturbance (HCC) 10/25/2020    Conditions to be addressed/monitored: Dementia.  Level of Care Concerns, ADL /ADL Limitations, Limited Access to Caregiver, Cognitive Deficits, Memory Deficits, and Lacks Knowledge of [employer].  Care Plan : LCSW Plan of Care  Updates made by Karolee Stamps, LCSW since 03/08/2021 12:00 AM     Problem: Maintain My Quality of Life through [facility] and Aide Services.   Priority: High     Goal: Maintain My Quality of Life through [facility] and Aide Services.   Start Date: 02/22/2021  Expected End Date: 03/26/2021  This Visit's Progress: On track  Recent Progress: On track  Priority: High  Note:   Current Barriers:  Patient with Dementia with Behavioral Disturbance, Agitation, Hyperlipidemia and Caregiver Stress needs Support, Education, and Care Coordination to resolve unmet personal care needs in the home. Patient is unable to self-administer medications as prescribed. Patient is unable to consistently perform ADL's/IADL's independently. Financial constraints related to inability to  pay for in-home care services out-of-pocket. Patient is not eligible to receive funding for in-home care services through Adult Medicaid with the [facility] of Social Services, due to exceeding income guidelines. Level of care concerns, family is now discussing long-term memory care assisted living placement for patient.   Lacks knowledge of available community agencies and resources. Clinical Goals:  Over the next 30 to 45 days, patient will receive long-term placement into a memory care assisted living facility.   Patient and daughter-in-law will receive, review and decide on at least 4 assisted living facilities of interest that will provide patient with long-term memory care services. Patient will attend all scheduled medical appointments as evidenced by patient report and care team review of appointment completion in electronic medical record. Patient will demonstrate improved health management independence as evidenced by having long-term memory care assisted living in place. Clinical Interventions: Patient and daughter-in-law interviewed and appropriate assessments performed. Collaboration with patient and daughter-in-law regarding development and update of comprehensive plan of care as evidenced by provider attestation and co-signature. Inter-disciplinary care team collaboration (see longitudinal plan of care). Interventions performed:  Problem Solving/Task Centered, Psychoeducation/Health Education, Quality of Sleep Assessed and Sleep Hygiene Techniques Promoted, Caregiver Stress Acknowledged and Consideration of In-Home Care Services Encouraged. Discussed plans with patient and daughter-in-law for ongoing care management follow-up and provided direct contact information for care management team. Assisted patient and daughter-in-law with obtaining information about health plan benefits through [employer] and [employer]. Provided education to patient and  daughter-in-law regarding level of care options. Assessed needs, level of care concerns, basic eligibility and provided education on the long-term care assisted living process.   Identified resources and durable medical equipment needed in the home to improve safety and promote independence. LCSW will collaborate with patient's Primary Care Physician, Dr. Theodis Shove to request completion on an FL-2 Form for placement purposes. LCSW will fax completed and signed FL-2 Form from patient's Primary Care Physician, Dr. Theodis Shove to all long-term memory care assisted living facilities of choice. LCSW will obtain a copy of patient's most recent Chest X-Ray results (must be within the last 12 months) and provide to the long-term memory care assisted living facility that patient and daughter-in-law have chosen. LCSW will collaborate with patient's Primary Care Physician, Dr. Theodis Shove to request that a COVID-19 Screening be performed within 72 hours of patient's admission into a long-term memory care assisted living facility. Patient Goals/Self-Care Activities: Work with LCSW on a weekly basis to initiate long-term memory care assisted living placement. Have difficult conversation with son, Almira Phetteplace, and daughter-in-law, Trynity Skousen regarding need for long-term memory care assisted living placement. Review list of long-term memory care assisted living facilities that LCSW e-mailed ([email or web address]) to your daughter-in-law, Jessilynn Taft and decide on at least 4 facilities of interest.   Son, Britiany Silbernagel, and daughter-in-law, Ginnie Marich will work with you and your bank to get all of your affairs in order. Start touring long-term memory care assisted living facilities with daughter-in-law, Uniqua Kihn and decide on at least 4 facilities of interest. Be prepared to provide LCSW with at least 4 long-term memory care assisted living facilities that you have chosen, during our next  scheduled telephone outreach encounter. Contact LCSW (# [id number]) directly if you have questions or need additional social work services and resources, in the meantime. Follow Up Plan: LCSW will follow-up with patient and patient's daughter-in-law by telephone on 03/15/2021 at 2:30pm.      Follow-Up Plan: 03/15/2021 at 2:30pm.      Danford Bad LCSW Licensed Clinical Social Worker [facility] [phone]

## 2021-03-08 NOTE — Patient Instructions (Signed)
Visit Information  PATIENT GOALS:  Goals Addressed             This Visit's Progress    Maintain My Quality of Life through In-Home Care and Aide Services.   On track    Timeframe:  Short-Term Goal Priority:  High Start Date:     02/22/2021                        Expected End Date:   03/26/2021                Follow-Up Date:  03/15/2021 at 2:30pm.  Patient Goals/Self-Care Activities: Work with LCSW on a weekly basis to initiate long-term memory care assisted living placement. Have difficult conversation with son, Alicya Bena, and daughter-in-law, Laural Eiland regarding need for long-term memory care assisted living placement. Review list of long-term memory care assisted living facilities that LCSW e-mailed ([email or web address]) to your daughter-in-law, Evynn Boutelle and decide on at least 4 facilities of interest.   Son, Roseline Ebarb, and daughter-in-law, Erisha Paugh will work with you and your bank to get all of your affairs in order. Start touring long-term memory care assisted living facilities with daughter-in-law, Denica Web and decide on at least 4 facilities of interest. Be prepared to provide LCSW with at least 4 long-term memory care assisted living facilities that you have chosen, during our next scheduled telephone outreach encounter. Contact LCSW (# [email or web address]) directly if you have questions or need additional social work services and resources, in the meantime. Follow Up Plan: LCSW will follow-up with patient and patient's daughter-in-law by telephone on 03/15/2021 at 2:30pm.        Patient verbalizes understanding of instructions provided today and agrees to view in MyChart.   Telephone follow-up appointment with care management team member scheduled for:  03/15/2021 at 2:30pm.  Danford Bad LCSW Licensed Clinical Social Worker [facility] [phone]

## 2021-03-11 ENCOUNTER — Other Ambulatory Visit: Payer: Self-pay | Admitting: Family Medicine

## 2021-03-15 ENCOUNTER — Ambulatory Visit: Payer: Medicare Other | Admitting: *Deleted

## 2021-03-15 DIAGNOSIS — R451 Restlessness and agitation: Secondary | ICD-10-CM

## 2021-03-15 DIAGNOSIS — F0280 Dementia in other diseases classified elsewhere without behavioral disturbance: Secondary | ICD-10-CM

## 2021-03-15 DIAGNOSIS — G309 Alzheimer's disease, unspecified: Secondary | ICD-10-CM

## 2021-03-15 DIAGNOSIS — E039 Hypothyroidism, unspecified: Secondary | ICD-10-CM

## 2021-03-15 DIAGNOSIS — F0281 Dementia in other diseases classified elsewhere with behavioral disturbance: Secondary | ICD-10-CM

## 2021-03-15 DIAGNOSIS — E785 Hyperlipidemia, unspecified: Secondary | ICD-10-CM

## 2021-03-15 NOTE — Chronic Care Management (AMB) (Signed)
Chronic Care Management    Clinical Social Work Note  03/15/2021 Name: Donna Patrick MRN: [id number] DOB: Oct 19, 1950  Donna Patrick is a 70 y.o. year old female who is a primary care patient of Koberlein, [name], MD. The CCM team was consulted to assist the patient with chronic disease management and/or care coordination needs related to: [employer], Level of Care Concerns, and Caregiver Stress.   Engaged with patient and daughter-in-law by telephone for follow-up visit in response to provider referral for social work chronic care management and care coordination services.   Consent to Services:  The patient was given information about Chronic Care Management services, agreed to services, and gave verbal consent prior to initiation of services.  Please see initial visit note for detailed documentation.   Patient agreed to services and consent obtained.   Assessment: Review of patient past medical history, allergies, medications, and health status, including review of relevant consultants reports was performed today as part of a comprehensive evaluation and provision of chronic care management and care coordination services.     SDOH (Social Determinants of Health) assessments and interventions performed:    Advanced Directives Status: Not addressed in this encounter.  CCM Care Plan  No Known Allergies  Outpatient Encounter Medications as of 03/15/2021  Medication Sig   cholecalciferol (VITAMIN D3) 25 MCG (1000 UNIT) tablet Take 2,000 Units by mouth daily.   donepezil (ARICEPT) 5 MG tablet Take 1 tablet (5 mg total) by mouth at bedtime.   levothyroxine (SYNTHROID) 88 MCG tablet Take 1 tablet (88 mcg total) by mouth daily before breakfast.   memantine (NAMENDA) 10 MG tablet Take 1 tablet (10 mg total) by mouth 2 (two) times daily.   oxybutynin (DITROPAN-XL) 10 MG 24 hr tablet Take 1 tablet (10 mg total) by mouth daily.   risperiDONE (RISPERDAL) 0.5 MG tablet Take 1 tablet  (0.5 mg total) by mouth 2 (two) times daily.   simvastatin (ZOCOR) 40 MG tablet Take 1 tablet (40 mg total) by mouth daily.   traZODone (DESYREL) 50 MG tablet Take 0.5-1 tablets (25-50 mg total) by mouth at bedtime as needed for sleep.   triamcinolone ointment (KENALOG) 0.1 % SMARTSIG:Sparingly Topical Twice Daily   trospium (SANCTURA) 20 MG tablet Take 1 tablet (20 mg total) by mouth 2 (two) times daily.   No facility-administered encounter medications on file as of 03/15/2021.    Patient Active Problem List   Diagnosis Date Noted   Agitation 01/09/2021   Hypothyroid 12/15/2020   B12 deficiency 12/15/2020   Hyperlipidemia 10/25/2020   Overactive bladder 10/25/2020   Dementia with behavioral disturbance (HCC) 10/25/2020    Conditions to be addressed/monitored: HLD and Dementia.  Level of Care Concerns, ADL/IADL Limitations, Mental Health Concerns, Limited Access to Caregiver, Cognitive Deficits, Memory Deficits, and Lacks Knowledge of [employer].  Care Plan : LCSW Plan of Care  Updates made by Karolee Stamps, LCSW since 03/15/2021 12:00 AM     Problem: Maintain My Quality of Life through [facility] and Aide Services. Resolved 03/15/2021  Priority: High     Goal: Maintain My Quality of Life through [facility] and [employer]. Completed 03/15/2021  Start Date: 02/22/2021  Expected End Date: 03/15/2021  This Visit's Progress: On track  Recent Progress: On track  Priority: High  Note:   Current Barriers:   Patient with Dementia with Behavioral Disturbance, Agitation, Hyperlipidemia and Caregiver Stress needs Support, Education, and Care Coordination to resolve unmet personal care needs in  the home. Patient is unable to self-administer medications as prescribed. Patient is unable to consistently perform ADL's/IADL's independently. Financial constraints related to inability to pay for in-home care services out-of-pocket. Patient is not eligible to receive funding for  in-home care services through Adult Medicaid with the [facility] of Social Services, due to exceeding income guidelines. Level of care concerns, family is now discussing long-term memory care assisted living placement for patient.   Lacks knowledge of available community agencies and resources. Clinical Goals:  Over the next 30 to 45 days, patient will receive long-term placement into a memory care assisted living facility.   Patient and daughter-in-law will receive, review and decide on at least 4 assisted living facilities of interest that will provide patient with long-term memory care services. Patient will attend all scheduled medical appointments as evidenced by patient report and care team review of appointment completion in electronic medical record. Patient will demonstrate improved health management independence as evidenced by having long-term memory care assisted living in place. Clinical Interventions: Patient and daughter-in-law interviewed and appropriate assessments performed. Collaboration with patient and daughter-in-law regarding development and update of comprehensive plan of care as evidenced by provider attestation and co-signature. Inter-disciplinary care team collaboration (see longitudinal plan of care). Interventions performed:  Problem Solving/Task Centered, Psychoeducation/Health Education, Quality of Sleep Assessed and Sleep Hygiene Techniques Promoted, Caregiver Stress Acknowledged and Consideration of In-Home Care Services Encouraged. Discussed plans with patient and daughter-in-law for ongoing care management follow-up and provided direct contact information for care management team. Assisted patient and daughter-in-law with obtaining information about health plan benefits through [employer] and [employer]. Provided education to patient and daughter-in-law regarding level of care options. Assessed needs, level of care concerns,  basic eligibility and provided education on the long-term care assisted living process.   Identified resources and durable medical equipment needed in the home to improve safety and promote independence. LCSW will collaborate with patient's Primary Care Physician, Dr. Theodis Shove to request completion on an FL-2 Form for placement purposes. LCSW will fax completed and signed FL-2 Form from patient's Primary Care Physician, Dr. Theodis Shove to all long-term memory care assisted living facilities of choice. LCSW will obtain a copy of patient's most recent Chest X-Ray results (must be within the last 12 months) and provide to the long-term memory care assisted living facility that patient and daughter-in-law have chosen. LCSW will collaborate with patient's Primary Care Physician, Dr. Theodis Shove to request that a COVID-19 Screening be performed within 72 hours of patient's admission into a long-term memory care assisted living facility. FL-2 Form obtained from Primary Care Physician, Dr. Theodis Shove. Patient Goals/Self-Care Activities: Please notify LCSW if you change your mind about wanting to pursue long-term memory care assisted living facility placement. Review list of long-term memory care assisted living facilities that LCSW e-mailed ([email or web address]) to your daughter-in-law, Cindel Daugherty and contact LCSW directly [phone]) if you wish to pursue long-term memory care assisted living facility placement within the near future. Son, Zena Vitelli, and daughter-in-law, Shaunita Seney will work with you and your bank to get all of your affairs in order. Start touring long-term memory care assisted living facilities with daughter-in-law, Lucrezia Dehne, for future reference. Contact LCSW if additional social work needs are identified.   Per the request of your son and daughter-in-law, Cleone Slim and Shonice Wrisley, you will remain in the home and continue to receive 24 hour care and  supervision. Follow-Up Plan:  No Follow-Up Required.  Follow-Up Plan:  No Follow-Up Required.      Danford Bad LCSW Licensed Clinical Social Worker [facility] [phone]

## 2021-03-15 NOTE — Patient Instructions (Signed)
Visit Information  PATIENT GOALS:  Goals Addressed             This Visit's Progress    COMPLETED: Maintain My Quality of Life through In-Home Care and Aide Services.   On track    Timeframe:  Short-Term Goal Priority:  High Start Date:     02/22/2021                        Expected End Date:   03/15/2021            Follow-Up Date:  No Follow-Up Required.  Patient Goals/Self-Care Activities: Please notify LCSW if you change your mind about wanting to pursue long-term memory care assisted living facility placement. Review list of long-term memory care assisted living facilities that LCSW e-mailed ([email or web address]) to your daughter-in-law, Sophiagrace Benbrook and contact LCSW directly [phone]) if you wish to pursue long-term memory care assisted living facility placement within the near future. Son, Emmery Seiler, and daughter-in-law, Lamisha Roussell will work with you and your bank to get all of your affairs in order. Start touring long-term memory care assisted living facilities with daughter-in-law, Shanequia Kendrick, for future reference. Contact LCSW if additional social work needs are identified.   Per the request of your son and daughter-in-law, Cleone Slim and Tierria Watson, you will remain in the home and continue to receive 24 hour care and supervision.        Patient verbalizes understanding of instructions provided today and agrees to view in MyChart.   No Follow-Up Required.  Danford Bad LCSW Licensed Clinical Social Worker [facility] [phone]

## 2021-03-23 ENCOUNTER — Ambulatory Visit: Payer: Medicare Other | Admitting: Podiatry

## 2021-03-23 NOTE — Telephone Encounter (Signed)
ERROR

## 2021-03-26 ENCOUNTER — Encounter: Payer: Self-pay | Admitting: Family Medicine

## 2021-03-26 ENCOUNTER — Ambulatory Visit (INDEPENDENT_AMBULATORY_CARE_PROVIDER_SITE_OTHER): Payer: Medicare Other | Admitting: Family Medicine

## 2021-03-26 ENCOUNTER — Other Ambulatory Visit: Payer: Self-pay

## 2021-03-26 VITALS — BP 110/80 | HR 90 | Temp 98.3°F | Ht 65.0 in | Wt 144.7 lb

## 2021-03-26 DIAGNOSIS — M546 Pain in thoracic spine: Secondary | ICD-10-CM

## 2021-03-26 DIAGNOSIS — G309 Alzheimer's disease, unspecified: Secondary | ICD-10-CM | POA: Diagnosis not present

## 2021-03-26 DIAGNOSIS — G4700 Insomnia, unspecified: Secondary | ICD-10-CM

## 2021-03-26 DIAGNOSIS — R350 Frequency of micturition: Secondary | ICD-10-CM

## 2021-03-26 DIAGNOSIS — E039 Hypothyroidism, unspecified: Secondary | ICD-10-CM

## 2021-03-26 DIAGNOSIS — M542 Cervicalgia: Secondary | ICD-10-CM

## 2021-03-26 DIAGNOSIS — R451 Restlessness and agitation: Secondary | ICD-10-CM | POA: Diagnosis not present

## 2021-03-26 DIAGNOSIS — F0281 Dementia in other diseases classified elsewhere with behavioral disturbance: Secondary | ICD-10-CM

## 2021-03-26 LAB — CBC WITH DIFFERENTIAL/PLATELET
Basophils Absolute: 0 10*3/uL (ref 0.0–0.1)
Basophils Relative: 0.4 % (ref 0.0–3.0)
Eosinophils Absolute: 0.1 10*3/uL (ref 0.0–0.7)
Eosinophils Relative: 1 % (ref 0.0–5.0)
HCT: 38.7 % (ref 36.0–46.0)
Hemoglobin: 13.1 g/dL (ref 12.0–15.0)
Lymphocytes Relative: 18.4 % (ref 12.0–46.0)
Lymphs Abs: 1.5 10*3/uL (ref 0.7–4.0)
MCHC: 34 g/dL (ref 30.0–36.0)
MCV: 89.4 fl (ref 78.0–100.0)
Monocytes Absolute: 0.6 10*3/uL (ref 0.1–1.0)
Monocytes Relative: 7.1 % (ref 3.0–12.0)
Neutro Abs: 6.1 10*3/uL (ref 1.4–7.7)
Neutrophils Relative %: 73.1 % (ref 43.0–77.0)
Platelets: 262 10*3/uL (ref 150.0–400.0)
RBC: 4.32 Mil/uL (ref 3.87–5.11)
RDW: 12.8 % (ref 11.5–15.5)
WBC: 8.4 10*3/uL (ref 4.0–10.5)

## 2021-03-26 LAB — COMPREHENSIVE METABOLIC PANEL
ALT: 20 U/L (ref 0–35)
AST: 27 U/L (ref 0–37)
Albumin: 4.1 g/dL (ref 3.5–5.2)
Alkaline Phosphatase: 80 U/L (ref 39–117)
BUN: 12 mg/dL (ref 6–23)
CO2: 32 mEq/L (ref 19–32)
Calcium: 9.3 mg/dL (ref 8.4–10.5)
Chloride: 102 mEq/L (ref 96–112)
Creatinine, Ser: 0.9 mg/dL (ref 0.40–1.20)
GFR: 65.65 mL/min (ref >60.00)
Glucose, Bld: 96 mg/dL (ref 70–99)
Potassium: 3.6 mEq/L (ref 3.5–5.1)
Sodium: 142 mEq/L (ref 135–145)
Total Bilirubin: 0.4 mg/dL (ref 0.2–1.2)
Total Protein: 6.9 g/dL (ref 6.0–8.3)

## 2021-03-26 LAB — TSH: TSH: 13.42 u[IU]/mL — ABNORMAL HIGH (ref 0.35–5.50)

## 2021-03-26 MED ORDER — RISPERIDONE 1 MG PO TABS: 1.0000 mg | ORAL_TABLET | Freq: Two times a day (BID) | ORAL | 2 refills | Status: DC

## 2021-03-26 MED ORDER — SULFAMETHOXAZOLE-TRIMETHOPRIM 800-160 MG PO TABS: 1.0000 | ORAL_TABLET | Freq: Two times a day (BID) | ORAL | 0 refills | Status: DC

## 2021-03-26 MED ORDER — TRAZODONE HCL 50 MG PO TABS: 50.0000 mg | ORAL_TABLET | Freq: Every evening | ORAL | 1 refills | Status: DC | PRN

## 2021-03-26 MED ORDER — MELOXICAM 7.5 MG PO TABS: 7.5000 mg | ORAL_TABLET | Freq: Every day | ORAL | 0 refills | Status: DC

## 2021-03-26 NOTE — Progress Notes (Signed)
Donna Patrick DOB: 1951-05-22 Encounter date: 03/26/2021  This is a 70 y.o. female who presents with Chief Complaint  Patient presents with   Back Pain    X1 week   Eye Problem    Patients daughter in a law is concerned that the patient "will not look up recently", concerned if symptoms are due to dementia    History of present illness: Seems to be tuning family out a lot more recently. Not looking up. Won't look up even at grandchildren. They are worried that it is related to dementia. Not seeing things - like asking her to pick something up and she is asking "where is it at". Has been going on for a couple of weeks.   Complaining of back pain. Did urinate through underwear here in the office today. Yesterday sat down in shower - never sits down in shower. Needed physical help to get out of shower and into bed. Not even getting out of bed.   Now sitting in recliner looking down; not sitting back, not looking up.   Cut out all tea consumption - just water only. Added apple juice in with ice, then one cup apple juice. Hasn't helped with frequency.    No Known Allergies Current Meds  Medication Sig   cholecalciferol (VITAMIN D3) 25 MCG (1000 UNIT) tablet Take 2,000 Units by mouth daily.   donepezil (ARICEPT) 5 MG tablet Take 1 tablet (5 mg total) by mouth at bedtime.   levothyroxine (SYNTHROID) 88 MCG tablet Take 1 tablet (88 mcg total) by mouth daily before breakfast.   meloxicam (MOBIC) 7.5 MG tablet Take 1 tablet (7.5 mg total) by mouth daily.   memantine (NAMENDA) 10 MG tablet Take 1 tablet (10 mg total) by mouth 2 (two) times daily.   oxybutynin (DITROPAN-XL) 10 MG 24 hr tablet Take 1 tablet (10 mg total) by mouth daily.   risperiDONE (RISPERDAL) 1 MG tablet Take 1 tablet (1 mg total) by mouth 2 (two) times daily.   simvastatin (ZOCOR) 40 MG tablet Take 1 tablet (40 mg total) by mouth daily.   sulfamethoxazole-trimethoprim (BACTRIM DS) 800-160 MG tablet Take 1 tablet by mouth 2  (two) times daily.   triamcinolone ointment (KENALOG) 0.1 % SMARTSIG:Sparingly Topical Twice Daily   trospium (SANCTURA) 20 MG tablet Take 1 tablet (20 mg total) by mouth 2 (two) times daily.   [DISCONTINUED] risperiDONE (RISPERDAL) 0.5 MG tablet Take 1 tablet (0.5 mg total) by mouth 2 (two) times daily.   [DISCONTINUED] traZODone (DESYREL) 50 MG tablet Take 0.5-1 tablets (25-50 mg total) by mouth at bedtime as needed for sleep.    Review of Systems  Constitutional:  Positive for activity change (has not been active around house and in/out of drawers and closets like normal). Negative for appetite change, chills, fatigue, fever and unexpected weight change.  HENT:  Negative for congestion, ear pain, hearing loss, sinus pressure, sinus pain, sore throat and trouble swallowing.   Eyes:  Positive for visual disturbance (?patient denies; see hpi).  Respiratory:  Negative for cough, chest tightness, shortness of breath and wheezing.   Cardiovascular:  Negative for chest pain, palpitations and leg swelling.  Gastrointestinal:  Negative for abdominal pain, blood in stool, constipation, diarrhea, nausea and vomiting.  Genitourinary:  Positive for difficulty urinating, enuresis and frequency. Negative for dysuria and menstrual problem.  Musculoskeletal:  Positive for back pain, neck pain and neck stiffness. Negative for arthralgias.  Skin:  Negative for rash.  Neurological:  Negative for dizziness,  weakness, numbness and headaches.  Hematological:  Negative for adenopathy. Does not bruise/bleed easily.  Psychiatric/Behavioral:  Negative for sleep disturbance and suicidal ideas. The patient is not nervous/anxious.    Objective:  BP 110/80 (BP Location: Left Arm, Patient Position: Sitting, Cuff Size: Normal)   Pulse 90   Temp 98.3 F (36.8 C) (Rectal)   Ht 5\' 5"  (1.651 m)   Wt 144 lb 11.2 oz (65.6 kg)   SpO2 97%   BMI 24.08 kg/m   Weight: 144 lb 11.2 oz (65.6 kg)   BP Readings from Last 3  Encounters:  03/26/21 110/80  03/05/21 132/80  02/26/21 (!) 142/82   Wt Readings from Last 3 Encounters:  03/26/21 144 lb 11.2 oz (65.6 kg)  03/05/21 144 lb 9.6 oz (65.6 kg)  02/26/21 144 lb 1.6 oz (65.4 kg)    Physical Exam Constitutional:      General: She is awake. She is not in acute distress.    Appearance: She is well-developed. She is not diaphoretic.  HENT:     Head: Normocephalic and atraumatic.     Jaw: There is normal jaw occlusion. No tenderness.     Right Ear: Tympanic membrane, ear canal and external ear normal.     Left Ear: Tympanic membrane, ear canal and external ear normal.  Eyes:     Conjunctiva/sclera: Conjunctivae normal.     Pupils: Pupils are equal, round, and reactive to light.  Neck:     Thyroid: No thyromegaly.  Cardiovascular:     Rate and Rhythm: Normal rate and regular rhythm.     Pulses: Normal pulses.     Heart sounds: Normal heart sounds. No murmur heard.   No friction rub. No gallop.  Pulmonary:     Effort: Pulmonary effort is normal. No respiratory distress.     Breath sounds: Normal breath sounds. No wheezing or rales.  Chest:     Comments: Tenderness right lower ribs, mild. No step offs or bruising appreciated. Abdominal:     General: Abdomen is flat. Bowel sounds are normal.     Palpations: Abdomen is soft.     Tenderness: There is no abdominal tenderness. There is no right CVA tenderness or left CVA tenderness.  Musculoskeletal:     Cervical back: Neck supple.     Right lower leg: No edema.     Left lower leg: No edema.     Comments: Limited right neck turn, due to pain/musculature - right side of neck. Right parathoracic muscle spasm that is tender. Left SI tenderness to palpation. Difficulty with extension of back; patient sitting in flexed and slightly left rotated position.   Lymphadenopathy:     Cervical: No cervical adenopathy.  Skin:    General: Skin is warm and dry.  Neurological:     Mental Status: She is oriented to  person, place, and time.     Cranial Nerves: Cranial nerves are intact. No cranial nerve deficit.     Motor: No weakness or abnormal muscle tone.     Deep Tendon Reflexes: Reflexes normal.     Reflex Scores:      Tricep reflexes are 2+ on the right side and 2+ on the left side.      Bicep reflexes are 2+ on the right side and 2+ on the left side.      Brachioradialis reflexes are 2+ on the right side and 2+ on the left side.      Patellar reflexes are 2+ on the  right side and 2+ on the left side.    Comments: Negative straight leg raise, she is able to hip flex.   Walking is slow, more shuffling gait.  She is responsive to questions, but does not respond intermittently in conversation like she has in the past. Slower to respond. Sitting much more still without  typical restlessness.   Psychiatric:        Behavior: Behavior normal.    Assessment/Plan  1. Alzheimer's dementia with behavioral disturbance, unspecified timing of dementia onset [facility]) Does follow with neurology. Currently on aricept, namenda. See below  2. Acute midline thoracic back pain This seems newer onset. No falls or known injury. Will try meloxicam and DIL is going to work with her on gentle range of motion exercises, suggested thermacare wrap to help with muscle pain.  - meloxicam (MOBIC) 7.5 MG tablet; Take 1 tablet (7.5 mg total) by mouth daily.  Dispense: 30 tablet; Refill: 0  3. Neck pain Hoping that meloxicam will help. No cervical spine tenderness. Can consider DO treatment if not improving.   4. Urinary frequency Unable to give urine sample today. Due to history, I am going to treat with bactrim. Patient tried twice and had accident on third try. Uncertain if infection could be related to change in mood; will also get blodowork.  5. Agitation Getting some bloodwork. Agitation has been gradually increasing; will refill risperdal at slightly higher dose to address agitation as this has seemed helpful for  her. - CBC with Differential/Platelet; Future - Comprehensive metabolic panel; Future - risperiDONE (RISPERDAL) 1 MG tablet; Take 1 tablet (1 mg total) by mouth 2 (two) times daily.  Dispense: 60 tablet; Refill: 2 - Comprehensive metabolic panel - CBC with Differential/Platelet  6. Hypothyroidism, unspecified type Recheck levels; currently on synthroid daily. - TSH; Future - TSH  7. Insomnia, unspecified type Has worked well for sleep. - traZODone (DESYREL) 50 MG tablet; Take 1 tablet (50 mg total) by mouth at bedtime as needed for sleep.  Dispense: 90 tablet; Refill: 1   Return for pending results.   43 minutes spent in chart review, time with patient, exam, charting.    Theodis Shove, MD

## 2021-03-28 MED ORDER — LEVOTHYROXINE SODIUM 100 MCG PO TABS: 100.0000 ug | ORAL_TABLET | Freq: Every day | ORAL | 1 refills | Status: DC

## 2021-03-28 NOTE — Addendum Note (Signed)
Addended by: Johnella Moloney on: 03/28/2021 09:21 AM   Modules accepted: Orders

## 2021-04-02 ENCOUNTER — Telehealth: Payer: Self-pay

## 2021-04-02 NOTE — Telephone Encounter (Signed)
Spoke with the patients daughter in law and informed her of the message below.  See prior results note in which she was informed of the thyroid test results.  She stated she will contact the urologist and neurologist for further instructions.

## 2021-04-02 NOTE — Telephone Encounter (Signed)
We really need a urine sample. Ok to order and have them get tools for collection if they can pick this up. Make sure they received result note on thyroid being off. She can call urology for urine sample if Kimmi cannot go at home - they could cath her to get sample if needed.   Can also check in with neurology due to agitation to see what they say since they are managing this as well.

## 2021-04-03 ENCOUNTER — Telehealth: Payer: Self-pay | Admitting: Neurology

## 2021-04-03 MED ORDER — HYDROXYZINE HCL 25 MG PO TABS: 25.0000 mg | ORAL_TABLET | Freq: Three times a day (TID) | ORAL | 5 refills | Status: DC | PRN

## 2021-04-03 NOTE — Telephone Encounter (Signed)
Pt's daughter in law called stating that the pt has been uncontrollably violent recently and she states that the PCP advised her to get the pt's medication from the Neurologist increased. Pt has been throwing and breaking things, not being compliant, destructive, hitting etc. Please advise.

## 2021-04-03 NOTE — Telephone Encounter (Signed)
Called the patient's daughter in law to inform her that we discussed the concerns with Dr Epimenio Foot. Advised the pt's daughter he would recommend her starting the hydroxyzine 25 mg up to three times a day as needed. Informed her that if the patient gets to drowsy with the medication advised her to let us know.  Medication will be sent to the pharmacy on file.

## 2021-04-16 NOTE — Telephone Encounter (Signed)
Pt's daughter in law called stating that the pt has gotten worse. Pt just sits for hours staring at the table and there is nothing on the table. Also they are having problems with her when she goes to the bathroom. Please advise.

## 2021-04-16 NOTE — Telephone Encounter (Signed)
Called the patient's daughter in law. She states that she still has moments of combativeness but that has eased up some since adding the hydroxyzine.

## 2021-04-16 NOTE — Telephone Encounter (Signed)
She states the staring spells were occurring prior to adding the new medication. She is still having moments of racial slurs, combativeness and spitefulness. I advised this is part of the progression of the disease but may be beneficial to bring her in take a look at medications and see if some changes should be made. Daughter was able to accept an opening tomorrow

## 2021-04-17 ENCOUNTER — Encounter: Payer: Self-pay | Admitting: Family Medicine

## 2021-04-17 ENCOUNTER — Ambulatory Visit (INDEPENDENT_AMBULATORY_CARE_PROVIDER_SITE_OTHER): Payer: Medicare Other | Admitting: Family Medicine

## 2021-04-17 VITALS — BP 137/76 | HR 105 | Ht 65.0 in | Wt 142.4 lb

## 2021-04-17 DIAGNOSIS — F0281 Dementia in other diseases classified elsewhere with behavioral disturbance: Secondary | ICD-10-CM

## 2021-04-17 DIAGNOSIS — G309 Alzheimer's disease, unspecified: Secondary | ICD-10-CM | POA: Diagnosis not present

## 2021-04-17 NOTE — Patient Instructions (Signed)
Below is our plan:  We will continue Risperdal 1mg  twice daily. Use hydroxyzine 25mg  up to three times daily as needed for agitation or behavioral concerns. Continue to work with PCP closely.   Please make sure you are staying well hydrated. I recommend 50-60 ounces daily. Well balanced diet and regular exercise encouraged. Consistent sleep schedule with 6-8 hours recommended.   Please continue follow up with care team as directed.   Follow up with me in 6-12 months, may follow up as needed if PCP refills medications.   You may receive a survey regarding today's visit. I encourage you to leave honest feed back as I do use this information to improve patient care. Thank you for seeing me today!
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
## 2021-04-19 ENCOUNTER — Other Ambulatory Visit: Payer: Self-pay | Admitting: Family Medicine

## 2021-04-20 ENCOUNTER — Telehealth: Payer: Self-pay

## 2021-04-20 DIAGNOSIS — M546 Pain in thoracic spine: Secondary | ICD-10-CM

## 2021-04-20 MED ORDER — MELOXICAM 7.5 MG PO TABS
7.5000 mg | ORAL_TABLET | Freq: Every day | ORAL | 0 refills | Status: DC
Start: 2021-04-20 — End: 2021-04-22

## 2021-04-20 NOTE — Telephone Encounter (Signed)
Daughter of patient call requesting Rx refill for  meloxicam (MOBIC) 7.5 MG tablet

## 2021-04-20 NOTE — Addendum Note (Signed)
Addended by: Johnella Moloney on: 04/20/2021 01:22 PM   Modules accepted: Orders

## 2021-04-20 NOTE — Telephone Encounter (Signed)
Rx done. 

## 2021-04-22 ENCOUNTER — Other Ambulatory Visit: Payer: Self-pay | Admitting: Family Medicine

## 2021-04-22 DIAGNOSIS — M546 Pain in thoracic spine: Secondary | ICD-10-CM

## 2021-04-24 ENCOUNTER — Telehealth: Payer: Self-pay

## 2021-04-24 DIAGNOSIS — H538 Other visual disturbances: Secondary | ICD-10-CM

## 2021-04-24 NOTE — Telephone Encounter (Signed)
Daughter of patient called stating pt is having blurry vision and would like advice on what she should do.

## 2021-04-24 NOTE — Telephone Encounter (Signed)
Spoke with the patients daughter in law for more information.  She stated the patient has had blurred vision intermittently for the past month.  Stated the patient will often reach for things and state that "she cannot see".  States she does not recall the date of the patients last eye exam as this was done in [place].  Denies sudden onset of blurred vision, denies headache, dizziness nor balance problems or other new symptoms.  Donna Patrick was informed as PCP is out of the office and advised an appt with ophthalmology.  Donna Patrick is aware the referral was placed and someone will call with appt info.

## 2021-04-24 NOTE — Addendum Note (Signed)
Addended by: Johnella Moloney on: 04/24/2021 10:08 AM   Modules accepted: Orders

## 2021-04-27 ENCOUNTER — Other Ambulatory Visit: Payer: Medicare Other

## 2021-05-07 ENCOUNTER — Ambulatory Visit: Payer: Medicare Other | Admitting: Podiatry

## 2021-05-10 ENCOUNTER — Other Ambulatory Visit: Payer: Self-pay | Admitting: Family Medicine

## 2021-05-11 ENCOUNTER — Other Ambulatory Visit: Payer: Medicare Other

## 2021-05-15 ENCOUNTER — Other Ambulatory Visit: Payer: Self-pay

## 2021-05-15 ENCOUNTER — Encounter: Payer: Self-pay | Admitting: Podiatry

## 2021-05-15 ENCOUNTER — Ambulatory Visit (INDEPENDENT_AMBULATORY_CARE_PROVIDER_SITE_OTHER): Payer: Medicare Other | Admitting: Podiatry

## 2021-05-15 DIAGNOSIS — M79675 Pain in left toe(s): Secondary | ICD-10-CM | POA: Diagnosis not present

## 2021-05-15 DIAGNOSIS — M79674 Pain in right toe(s): Secondary | ICD-10-CM

## 2021-05-15 DIAGNOSIS — B351 Tinea unguium: Secondary | ICD-10-CM

## 2021-05-15 NOTE — Progress Notes (Signed)
This patient returns to the office for evaluation and treatment of long thick painful nails .  This patient is unable to trim her own nails since the patient cannot reach her feet.  Patient says the nails are painful walking and wearing his shoes. She returns for preventive foot care services. She presents with a female caregiver.  General Appearance  Alert, conversant and in no acute stress.  Vascular  Dorsalis pedis and posterior tibial  pulses are palpable  bilaterally.  Capillary return is within normal limits  bilaterally. Temperature is within normal limits  bilaterally.  Neurologic  Senn-Weinstein monofilament wire test within normal limits  bilaterally. Muscle power within normal limits bilaterally.  Nails Thick disfigured discolored nails with subungual debris  from hallux to fifth toes bilaterally. No evidence of bacterial infection or drainage bilaterally.  Orthopedic  No limitations of motion  feet .  No crepitus or effusions noted.  No bony pathology or digital deformities noted.  Skin  normotropic skin with no porokeratosis noted bilaterally.  No signs of infections or ulcers noted.     Onychomycosis  Pain in toes right foot  Pain in toes left foot  Debridement  of nails  1-5  B/L with a nail nipper.  Nails were then filed using a dremel tool with no incidents.    RTC 3 months    Helane Gunther DPM

## 2021-05-17 ENCOUNTER — Other Ambulatory Visit: Payer: Self-pay | Admitting: Family Medicine

## 2021-05-17 DIAGNOSIS — M546 Pain in thoracic spine: Secondary | ICD-10-CM

## 2021-05-19 ENCOUNTER — Emergency Department (HOSPITAL_COMMUNITY): Payer: Medicare Other

## 2021-05-19 ENCOUNTER — Encounter (HOSPITAL_COMMUNITY): Payer: Self-pay

## 2021-05-19 ENCOUNTER — Emergency Department (HOSPITAL_COMMUNITY)
Admission: EM | Admit: 2021-05-19 | Discharge: 2021-05-19 | Disposition: A | Discharge status: Home / Self Care | Payer: Medicare Other | Attending: Emergency Medicine | Admitting: Emergency Medicine

## 2021-05-19 DIAGNOSIS — Z87891 Personal history of nicotine dependence: Secondary | ICD-10-CM | POA: Diagnosis not present

## 2021-05-19 DIAGNOSIS — Z79899 Other long term (current) drug therapy: Secondary | ICD-10-CM | POA: Insufficient documentation

## 2021-05-19 DIAGNOSIS — K0889 Other specified disorders of teeth and supporting structures: Secondary | ICD-10-CM | POA: Diagnosis not present

## 2021-05-19 DIAGNOSIS — N3000 Acute cystitis without hematuria: Secondary | ICD-10-CM | POA: Diagnosis not present

## 2021-05-19 DIAGNOSIS — M79602 Pain in left arm: Secondary | ICD-10-CM | POA: Insufficient documentation

## 2021-05-19 DIAGNOSIS — D72829 Elevated white blood cell count, unspecified: Secondary | ICD-10-CM | POA: Insufficient documentation

## 2021-05-19 DIAGNOSIS — E039 Hypothyroidism, unspecified: Secondary | ICD-10-CM | POA: Insufficient documentation

## 2021-05-19 DIAGNOSIS — Z20822 Contact with and (suspected) exposure to covid-19: Secondary | ICD-10-CM | POA: Insufficient documentation

## 2021-05-19 DIAGNOSIS — R1030 Lower abdominal pain, unspecified: Secondary | ICD-10-CM | POA: Insufficient documentation

## 2021-05-19 DIAGNOSIS — R079 Chest pain, unspecified: Secondary | ICD-10-CM | POA: Diagnosis not present

## 2021-05-19 DIAGNOSIS — F03A18 Unspecified dementia, mild, with other behavioral disturbance: Secondary | ICD-10-CM | POA: Diagnosis not present

## 2021-05-19 DIAGNOSIS — R220 Localized swelling, mass and lump, head: Secondary | ICD-10-CM | POA: Diagnosis present

## 2021-05-19 LAB — CBC WITH DIFFERENTIAL/PLATELET
Abs Immature Granulocytes: 0.01 10*3/uL (ref 0.00–0.07)
Basophils Absolute: 0 10*3/uL (ref 0.0–0.1)
Basophils Relative: 1 %
Eosinophils Absolute: 0.2 10*3/uL (ref 0.0–0.5)
Eosinophils Relative: 3 %
HCT: 36.6 % (ref 36.0–46.0)
Hemoglobin: 12 g/dL (ref 12.0–15.0)
Immature Granulocytes: 0 %
Lymphocytes Relative: 21 %
Lymphs Abs: 1.2 10*3/uL (ref 0.7–4.0)
MCH: 29.9 pg (ref 26.0–34.0)
MCHC: 32.8 g/dL (ref 30.0–36.0)
MCV: 91 fL (ref 80.0–100.0)
Monocytes Absolute: 0.4 10*3/uL (ref 0.1–1.0)
Monocytes Relative: 7 %
Neutro Abs: 3.9 10*3/uL (ref 1.7–7.7)
Neutrophils Relative %: 68 %
Platelets: 200 10*3/uL (ref 150–400)
RBC: 4.02 MIL/uL (ref 3.87–5.11)
RDW: 12.2 % (ref 11.5–15.5)
WBC: 5.8 10*3/uL (ref 4.0–10.5)
nRBC: 0 % (ref 0.0–0.2)

## 2021-05-19 LAB — COMPREHENSIVE METABOLIC PANEL
ALT: 13 U/L (ref 0–44)
AST: 19 U/L (ref 15–41)
Albumin: 3.8 g/dL (ref 3.5–5.0)
Alkaline Phosphatase: 61 U/L (ref 38–126)
Anion gap: 9 (ref 5–15)
BUN: 13 mg/dL (ref 8–23)
CO2: 28 mmol/L (ref 22–32)
Calcium: 9.4 mg/dL (ref 8.9–10.3)
Chloride: 108 mmol/L (ref 98–111)
Creatinine, Ser: 1.04 mg/dL — ABNORMAL HIGH (ref 0.44–1.00)
GFR, Estimated: 59 mL/min — ABNORMAL LOW (ref >60)
Glucose, Bld: 100 mg/dL — ABNORMAL HIGH (ref 70–99)
Potassium: 3.4 mmol/L — ABNORMAL LOW (ref 3.5–5.1)
Sodium: 145 mmol/L (ref 135–145)
Total Bilirubin: 0.8 mg/dL (ref 0.3–1.2)
Total Protein: 6.7 g/dL (ref 6.5–8.1)

## 2021-05-19 LAB — URINALYSIS, ROUTINE W REFLEX MICROSCOPIC
Bilirubin Urine: NEGATIVE
Glucose, UA: NEGATIVE mg/dL
Hgb urine dipstick: NEGATIVE
Ketones, ur: NEGATIVE mg/dL
Nitrite: NEGATIVE
Protein, ur: NEGATIVE mg/dL
Specific Gravity, Urine: 1.019 (ref 1.005–1.030)
pH: 6 (ref 5.0–8.0)

## 2021-05-19 LAB — RESP PANEL BY RT-PCR (FLU A&B, COVID) ARPGX2
Influenza A by PCR: NEGATIVE
Influenza B by PCR: NEGATIVE
SARS Coronavirus 2 by RT PCR: NEGATIVE

## 2021-05-19 LAB — TROPONIN I (HIGH SENSITIVITY): Troponin I (High Sensitivity): 5 ng/L (ref <18)

## 2021-05-19 MED ORDER — CEPHALEXIN 500 MG PO CAPS: 500.0000 mg | ORAL_CAPSULE | Freq: Two times a day (BID) | ORAL | 0 refills | Status: AC

## 2021-05-19 NOTE — ED Notes (Signed)
ED Provider at bedside. 

## 2021-05-19 NOTE — ED Provider Notes (Signed)
[facility] EMERGENCY DEPARTMENT Provider Note   CSN: [id number] Arrival date & time: 05/19/21  0909     History Chief Complaint  Patient presents with   Jaw Pain     Donna Patrick is a 70 y.o. female with a history of dementia presenting for right facial swelling with brown discharge. History was provided by the son and partially by the patient (history limited secondary to dementia). Per patient's son, this morning patient was supposed to go to his sister's house when they noticed that her right cheek was swollen "like a golf ball" and a brownish discharge was coming out of her mouth. They had not noticed any swelling prior nor fevers and patient never seemed in pain. She did not get any of her medications this morning because of the discharge and coming to the ED.   Patient does not report any jaw pain or discharge, she does not recall events of the past few days and incorrectly recalled events from this morning (she stated that she took her medications when she did not). Patient reports that she has had some left arm pain and occassionally has a chest pain that started today, though the son had not heard about this pain. She denies having had any dental pain or drainage this morning.   The history is provided by the patient and a relative.    Last stool was on 9/29 per son.  Past Medical History:  Diagnosis Date   Dementia (HCC)    diagnosed 8-10 years ago   Hyperlipidemia    Incontinence    Thyroid disease     Patient Active Problem List   Diagnosis Date Noted   Agitation 01/09/2021   Hypothyroid 12/15/2020   B12 deficiency 12/15/2020   Hyperlipidemia 10/25/2020   Overactive bladder 10/25/2020   Dementia with behavioral disturbance 10/25/2020    Past Surgical History:  Procedure Laterality Date   ABDOMINAL HYSTERECTOMY     DUPUYTREN CONTRACTURE RELEASE Left      OB History   No obstetric history on file.     Family History  Problem  Relation Age of Onset   Dementia Mother    Cancer Father    Dementia Brother    Parkinson's disease Brother     Social History   Tobacco Use   Smoking status: Former   Smokeless tobacco: Never  Building services engineer Use: Never used  Substance Use Topics   Alcohol use: Never   Drug use: Never    Home Medications Prior to Admission medications   Medication Sig Start Date End Date Taking? Authorizing Provider  cephALEXin (KEFLEX) 500 MG capsule Take 1 capsule (500 mg total) by mouth 2 (two) times daily for 7 days. 05/19/21 05/26/21 Yes Leshonda Galambos, DO  cholecalciferol (VITAMIN D3) 25 MCG (1000 UNIT) tablet Take 2,000 Units by mouth daily.    [provider]  donepezil (ARICEPT) 5 MG tablet Take 1 tablet (5 mg total) by mouth at bedtime. 12/15/20   Wynn Banker, MD  hydrOXYzine (ATARAX/VISTARIL) 25 MG tablet Take 1 tablet (25 mg total) by mouth 3 (three) times daily as needed. 04/03/21   Sater, Pearletha Furl, MD  levothyroxine (SYNTHROID) 100 MCG tablet Take 1 tablet (100 mcg total) by mouth daily. 03/28/21   Wynn Banker, MD  meloxicam (MOBIC) 7.5 MG tablet Take 1 tablet (7.5 mg total) by mouth daily as needed for pain. 05/18/21   Wynn Banker, MD  memantine (NAMENDA) 10  MG tablet TAKE 1 TABLET(10 MG) BY MOUTH TWICE DAILY 05/10/21   Koberlein, Jannette Spanner C, MD  oxybutynin (DITROPAN-XL) 10 MG 24 hr tablet Take 1 tablet (10 mg total) by mouth daily. 12/25/20   Wynn Banker, MD  risperiDONE (RISPERDAL) 1 MG tablet Take 1 tablet (1 mg total) by mouth 2 (two) times daily. 03/26/21   Wynn Banker, MD  simvastatin (ZOCOR) 40 MG tablet Take 1 tablet (40 mg total) by mouth daily. 12/05/20   Philip Aspen, Limmie Patricia, MD  sulfamethoxazole-trimethoprim (BACTRIM DS) 800-160 MG tablet Take 1 tablet by mouth 2 (two) times daily. 03/26/21   Wynn Banker, MD  traZODone (DESYREL) 50 MG tablet Take 1 tablet (50 mg total) by mouth at bedtime as needed for sleep. 03/26/21    Wynn Banker, MD  triamcinolone ointment (KENALOG) 0.1 % SMARTSIG:Sparingly Topical Twice Daily 01/18/21   [provider]  trospium (SANCTURA) 20 MG tablet Take 1 tablet (20 mg total) by mouth 2 (two) times daily. 01/09/21   Sater, Pearletha Furl, MD    Allergies    Patient has no known allergies.  Review of Systems   Review of Systems  Constitutional:  Negative for appetite change, chills, diaphoresis, fatigue and fever.  HENT:  Negative for ear discharge, ear pain, rhinorrhea and trouble swallowing.        Brown discharge from mouth per son  Eyes:  Negative for pain and visual disturbance.  Respiratory:  Negative for cough, choking, shortness of breath and wheezing.   Cardiovascular:  Positive for chest pain (ocassional mild chest pain). Negative for palpitations and leg swelling.  Gastrointestinal:  Negative for abdominal distention, abdominal pain, constipation, diarrhea and vomiting.  Endocrine: Negative for polyuria.  Genitourinary:  Negative for difficulty urinating, dysuria, frequency and urgency.  Musculoskeletal:  Negative for arthralgias.  Skin:  Positive for rash (faint red spots on face per son).   Physical Exam Updated Vital Signs BP (!) 170/85   Pulse 90   Temp 98 F (36.7 C) (Oral)   Resp 13   SpO2 96%   Physical Exam Constitutional:      Appearance: Normal appearance. She is normal weight.  HENT:     Head: Normocephalic and atraumatic.     Right Ear: Tympanic membrane, ear canal and external ear normal.     Left Ear: Tympanic membrane, ear canal and external ear normal.     Nose: Nose normal.     Mouth/Throat:     Mouth: Mucous membranes are moist.     Pharynx: Oropharyngeal exudate present.  Eyes:     Extraocular Movements: Extraocular movements intact.     Conjunctiva/sclera: Conjunctivae normal.     Pupils: Pupils are equal, round, and reactive to light.  Cardiovascular:     Rate and Rhythm: Normal rate and regular rhythm.     Pulses:  Normal pulses.     Heart sounds: Normal heart sounds.  Pulmonary:     Effort: Pulmonary effort is normal.     Breath sounds: Normal breath sounds.  Abdominal:     General: Abdomen is flat.     Palpations: Abdomen is soft. There is no mass.     Tenderness: There is abdominal tenderness (in lower quadrants). There is no guarding or rebound.  Musculoskeletal:     Cervical back: Normal range of motion and neck supple.  Skin:    General: Skin is warm and dry.     Capillary Refill: Capillary refill takes less than  2 seconds.  Neurological:     Mental Status: She is alert. Mental status is at baseline.    ED Results / Procedures / Treatments   Labs (all labs ordered are listed, but only abnormal results are displayed) Labs Reviewed  URINALYSIS, ROUTINE W REFLEX MICROSCOPIC - Abnormal; Notable for the following components:      Result Value   APPearance HAZY (*)    Leukocytes,Ua TRACE (*)    Bacteria, UA RARE (*)    All other components within normal limits  COMPREHENSIVE METABOLIC PANEL - Abnormal; Notable for the following components:   Potassium 3.4 (*)    Glucose, Bld 100 (*)    Creatinine, Ser 1.04 (*)    GFR, Estimated 59 (*)    All other components within normal limits  RESP PANEL BY RT-PCR (FLU A&B, COVID) ARPGX2  URINE CULTURE  CBC WITH DIFFERENTIAL/PLATELET  TROPONIN I (HIGH SENSITIVITY)    EKG None  Radiology DG Chest 2 View  Result Date: 05/19/2021 CLINICAL DATA:  Jaw pain.  Possible abscess. EXAM: CHEST - 2 VIEW COMPARISON:  None. FINDINGS: The heart size and mediastinal contours are within normal limits. Both lungs are clear. The visualized skeletal structures are unremarkable. Artifact from EKG leads. IMPRESSION: No active cardiopulmonary disease. Electronically Signed   By: Tiburcio Pea M.D.   On: 05/19/2021 10:56   CT HEAD WO CONTRAST ( )  Result Date: 05/19/2021 CLINICAL DATA:  Delirium.  Possible jaw infection EXAM: CT HEAD WITHOUT CONTRAST TECHNIQUE:  Contiguous axial images were obtained from the base of the skull through the vertex without intravenous contrast. COMPARISON:  02/13/2021 FINDINGS: Brain: No evidence of acute infarction, hemorrhage, hydrocephalus, extra-axial collection or mass lesion/mass effect. Brain atrophy especially notable in the temporal and occipital lobes. Confluent periventricular chronic small vessel ischemia. Vascular: No hyperdense vessel or unexpected calcification. Skull: Normal. Negative for fracture or focal lesion. Sinuses/Orbits: No acute finding. IMPRESSION: 1. No acute or reversible finding. 2. Age advanced brain atrophy. Electronically Signed   By: Tiburcio Pea M.D.   On: 05/19/2021 10:59   CT Maxillofacial Wo Contrast  Result Date: 05/19/2021 CLINICAL DATA:  Jaw pain with possible abscess. Pus from underneath dentures. EXAM: CT MAXILLOFACIAL WITHOUT CONTRAST TECHNIQUE: Multidetector CT imaging of the maxillofacial structures was performed. Multiplanar CT image reconstructions were also generated. COMPARISON:  None. FINDINGS: Osseous: Edentulous with alveolar ridge atrophy. No focal erosion, involucrum, or fracture. No visible soft tissue inflammation. Orbits: Negative Sinuses: Clear Soft tissues: No visible inflammation Limited intracranial: As described on dedicated head CT IMPRESSION: No acute finding or visible infection. Electronically Signed   By: Tiburcio Pea M.D.   On: 05/19/2021 11:01    Procedures Procedures   Medications Ordered in ED Medications - No data to display  ED Course  I have reviewed the triage vital signs and the nursing notes.  Pertinent labs & imaging results that were available during my care of the patient were reviewed by me and considered in my medical decision making (see chart for details).    MDM Rules/Calculators/A&P  Patient is a 70 y.o. female that was brought to the ED by EMS for right jaw swelling and discharge. Patient reported left arm pain and occasional chest  pain that had not been noted by family members before. Family  did note change in mentation from baseline.   Given patient's decline in mental status per family a CT head, CBC, CMP, and UA were obtained. CT mandible was also completed given concern for  abscess and was negative. As the scans were negative and physical exam was not concerning for a dental infection. Discussed with family proper denture cleaning as it may have been related to debris under the dentures.   UA obtained did show leukocytes and bacteria concerning for a possible UTI. Urine was sent for culture and patient was discharged with Keflex 500mg  twice daily x7 days. Return precautions discussed with the family  Regarding the reported left arm pain and occasional chest pain an EKG was obtained and was negative for arrhythmia and ST elevations. Troponin was negative and CXR was negative. No further work-up pursued at this time.   Final Clinical Impression(s) / ED Diagnoses Final diagnoses:  Pain, dental  Acute cystitis without hematuria    Rx / DC Orders ED Discharge Orders          Ordered    cephALEXin (KEFLEX) 500 MG capsule  2 times daily        05/19/21 1400             Ricki Vanhandel, DO 05/19/21 1405    07/19/21, MD 05/19/21 1646

## 2021-05-19 NOTE — ED Triage Notes (Signed)
Jaw pain, possible abscess. Family noted puss form jaw under dentures.

## 2021-05-19 NOTE — Discharge Instructions (Addendum)
You will take an antibiotic called Keflex twice daily for 7 days. If you begin to have fevers, worsening abdominal pain, or altered mental status please come back to the ER.

## 2021-05-20 LAB — URINE CULTURE: Culture: <10000 — AB

## 2021-05-23 ENCOUNTER — Ambulatory Visit
Admission: RE | Admit: 2021-05-23 | Discharge: 2021-05-23 | Disposition: A | Discharge status: Home / Self Care | Payer: Medicare Other | Source: Ambulatory Visit | Attending: Family Medicine | Admitting: Family Medicine

## 2021-05-23 ENCOUNTER — Other Ambulatory Visit: Payer: Self-pay

## 2021-05-23 DIAGNOSIS — E2839 Other primary ovarian failure: Secondary | ICD-10-CM

## 2021-05-31 ENCOUNTER — Other Ambulatory Visit: Payer: Medicare Other

## 2021-06-01 ENCOUNTER — Telehealth: Payer: Medicare Other | Admitting: Family Medicine

## 2021-06-02 ENCOUNTER — Other Ambulatory Visit: Payer: Self-pay | Admitting: Internal Medicine

## 2021-06-02 DIAGNOSIS — E785 Hyperlipidemia, unspecified: Secondary | ICD-10-CM

## 2021-06-03 ENCOUNTER — Other Ambulatory Visit: Payer: Self-pay | Admitting: Family Medicine

## 2021-06-03 DIAGNOSIS — F0280 Dementia in other diseases classified elsewhere without behavioral disturbance: Secondary | ICD-10-CM

## 2021-06-03 DIAGNOSIS — G309 Alzheimer's disease, unspecified: Secondary | ICD-10-CM

## 2021-06-04 ENCOUNTER — Telehealth: Payer: Self-pay | Admitting: Family Medicine

## 2021-06-04 ENCOUNTER — Other Ambulatory Visit: Payer: Self-pay | Admitting: Family Medicine

## 2021-06-04 DIAGNOSIS — E876 Hypokalemia: Secondary | ICD-10-CM

## 2021-06-04 DIAGNOSIS — E039 Hypothyroidism, unspecified: Secondary | ICD-10-CM

## 2021-06-04 DIAGNOSIS — R7989 Other specified abnormal findings of blood chemistry: Secondary | ICD-10-CM

## 2021-06-04 MED ORDER — LORAZEPAM 0.5 MG PO TABS: 0.5000 mg | ORAL_TABLET | Freq: Two times a day (BID) | ORAL | 1 refills | Status: DC | PRN

## 2021-06-04 NOTE — Telephone Encounter (Signed)
Patient has Dementia and Alzheimers--her daughter was just found deceased.  They don't know what to do whether is is best to tell her or not.  Patient is being very agitated almost like she knows something is going on.  Hard to get her to take her medicine or get her to eat.    Myrtie Leuthold is who called--patient's daughter in law.  She is wanting a call back for some advice on what to do.

## 2021-06-04 NOTE — Telephone Encounter (Signed)
Spoke with monique. States that Donna Patrick has been more agitated for last couple of days- knows that something is going on and has been not acting self, angry, out of sorts, not eating. Sleep cycle is off. We discussed telling her news of daughters passing and I encouraged her to do so. I think it will be important since people will be coming in and out and there will be a service to attend that [name] knows what is going on around her. We discussed difficulty with telling her this information. I am going to give small supply of ativan for her if needed. Discussed using only on more "emergency" basis, but with difficult news and underlying agitation this will give them a tool for her. She will call if needed and has lab visit tomorrow.

## 2021-06-05 ENCOUNTER — Other Ambulatory Visit (INDEPENDENT_AMBULATORY_CARE_PROVIDER_SITE_OTHER): Payer: Medicare Other

## 2021-06-05 ENCOUNTER — Other Ambulatory Visit: Payer: Self-pay

## 2021-06-05 DIAGNOSIS — E876 Hypokalemia: Secondary | ICD-10-CM

## 2021-06-05 DIAGNOSIS — R7989 Other specified abnormal findings of blood chemistry: Secondary | ICD-10-CM

## 2021-06-05 DIAGNOSIS — E039 Hypothyroidism, unspecified: Secondary | ICD-10-CM

## 2021-06-05 LAB — COMPREHENSIVE METABOLIC PANEL
ALT: 8 U/L (ref 0–35)
AST: 14 U/L (ref 0–37)
Albumin: 4 g/dL (ref 3.5–5.2)
Alkaline Phosphatase: 64 U/L (ref 39–117)
BUN: 16 mg/dL (ref 6–23)
CO2: 31 mEq/L (ref 19–32)
Calcium: 9.2 mg/dL (ref 8.4–10.5)
Chloride: 105 mEq/L (ref 96–112)
Creatinine, Ser: 0.91 mg/dL (ref 0.40–1.20)
GFR: 64.7 mL/min (ref >60.00)
Glucose, Bld: 102 mg/dL — ABNORMAL HIGH (ref 70–99)
Potassium: 2.9 mEq/L — ABNORMAL LOW (ref 3.5–5.1)
Sodium: 143 mEq/L (ref 135–145)
Total Bilirubin: 0.8 mg/dL (ref 0.2–1.2)
Total Protein: 6.6 g/dL (ref 6.0–8.3)

## 2021-06-05 LAB — TSH: TSH: 1.73 u[IU]/mL (ref 0.35–5.50)

## 2021-06-06 ENCOUNTER — Other Ambulatory Visit: Payer: Self-pay | Admitting: Family Medicine

## 2021-06-06 MED ORDER — POTASSIUM CHLORIDE CRYS ER 10 MEQ PO TBCR: EXTENDED_RELEASE_TABLET | ORAL | 0 refills | Status: DC

## 2021-06-08 ENCOUNTER — Other Ambulatory Visit: Payer: Self-pay | Admitting: Family Medicine

## 2021-06-08 DIAGNOSIS — E039 Hypothyroidism, unspecified: Secondary | ICD-10-CM

## 2021-06-15 ENCOUNTER — Encounter: Payer: Self-pay | Admitting: Family Medicine

## 2021-06-15 ENCOUNTER — Telehealth (INDEPENDENT_AMBULATORY_CARE_PROVIDER_SITE_OTHER): Payer: Medicare Other | Admitting: Family Medicine

## 2021-06-15 ENCOUNTER — Telehealth: Payer: Self-pay | Admitting: Family Medicine

## 2021-06-15 VITALS — Ht 65.0 in | Wt 146.0 lb

## 2021-06-15 DIAGNOSIS — R2241 Localized swelling, mass and lump, right lower limb: Secondary | ICD-10-CM | POA: Diagnosis not present

## 2021-06-15 DIAGNOSIS — R451 Restlessness and agitation: Secondary | ICD-10-CM | POA: Diagnosis not present

## 2021-06-15 DIAGNOSIS — M810 Age-related osteoporosis without current pathological fracture: Secondary | ICD-10-CM | POA: Diagnosis not present

## 2021-06-15 DIAGNOSIS — F03918 Unspecified dementia, unspecified severity, with other behavioral disturbance: Secondary | ICD-10-CM

## 2021-06-15 NOTE — Telephone Encounter (Signed)
Donna Patrick called to say they did not speak about bone density results today during virtual appointment. States Dr.Koberlein mentioned putting her on a new prescription but did not discuss that either.    Good callback number is [phone]    Please advise

## 2021-06-15 NOTE — Telephone Encounter (Signed)
Spoke with Donna Patrick - we are going to try the ativan 1/2 tab BID first; she had enough of this at home so I didn't send in. Just to see if that helps from agitation standpoint. We are going to hold off on bone density treatment to see how she does with response to the ativan because right now she is being combative and won't sit upright as she would  need with medication.

## 2021-06-15 NOTE — Progress Notes (Signed)
Virtual Visit via Video Note  I connected with Donna Patrick  on 06/15/21 at  3:00 PM EDT by a video enabled telemedicine application and verified that I am speaking with the correct person using two identifiers.  Location patient: home Location provider: [facility]  [address] [place], [place] [address] Persons participating in the virtual visit: patient, provider  I discussed the limitations of evaluation and management by telemedicine and the availability of in person appointments. The patient expressed understanding and agreed to proceed.   Donna Patrick DOB: 01-13-1951 Encounter date: 06/15/2021  This is a 70 y.o. female who presents with No chief complaint on file.   History of present illness: Visit set up originally to discuss bone density results. She does have osteoporosis on dexa, but due to additional concerns below including agitation, we are going to hold off on starting treatment while we acutely work on management of agitation.  Foot looks swollen; uncertain what happened. Bruising on toes - 3rd-4th. Not having pain. Can wiggle toes. Walking fine. No known injury. She does still get busy; usually later in the day. Wanting to sleep all day. Hard to get her motivated to do anything. Refusing to wash, wipe. Will wear diaper in chair rather than go to bathroom. Gets really stubborn and won't do things. Won't sit up to eat; sits at angle in chair; drops food eating. Inactive a lot.   They did pass along information about her daughter's passing, but she hasn't said anything since then. They are still doing autopsy, so nothing more has happened.   Still very combative at times. Wakes up every morning angry, hateful, mean. This has been going on for about 4 months.   Ativan does keep her calm, but reserves this for when she is really agitated. Hasn't had any since Sunday. Can't really take her anywhere due to behavior and actions. She acted so inappropriate  when out in store with [employer], telling another store customer that she wasn't getting any food at home and made her concerned enough that she called for welfare check, but when officer came to house (with store customer) and everyone understood that Kmari had dementia and saw the care she was receiving, nothing further was done.   Has had 10 people out to try and help with her care but no one would stay due to her being "mean". Paying out of pocket for their care. These were independent contractors but work through agencies as well. All of them quit. Combative and says inappropriate things all the time.   Still eating well. Has scaled back on what they are giving her - because she throws everything on the floor. Will do fruit. Won't sit upright. Defiant with behavior and not seeming to respond to "reminders" for doing things like she did in the past. (Ie they would tell her to put on socks rather than doing it for her, but now she will just refuse)    No Known Allergies No outpatient medications have been marked as taking for the 06/15/21 encounter (Appointment) with Wynn Banker, MD.    Review of Systems  Constitutional:  Negative for chills, fatigue (seems to sleep longer hours) and fever.  Respiratory:  Negative for cough, chest tightness, shortness of breath and wheezing.   Cardiovascular:  Negative for chest pain, palpitations and leg swelling.  Musculoskeletal:  Negative for arthralgias.  Skin:        Bruise on foot, see hpi  Psychiatric/Behavioral:  Positive for agitation, behavioral problems and decreased concentration. Negative for self-injury.    Objective:  There were no vitals taken for this visit.      BP Readings from Last 3 Encounters:  05/19/21 (!) 165/85  04/17/21 137/76  03/26/21 110/80   Wt Readings from Last 3 Encounters:  04/17/21 142 lb 6.4 oz (64.6 kg)  03/26/21 144 lb 11.2 oz (65.6 kg)  03/05/21 144 lb 9.6 oz (65.6 kg)    EXAM:  GENERAL:  appears sleepy (just woke up), and she is oriented to person, place. No acute distress. She is calm.   HEENT: atraumatic, conjunctiva clear, no obvious abnormalities on inspection of external nose and ears  NECK: normal movements of the head and neck  LUNGS: on inspection no signs of respiratory distress, breathing rate appears normal, no obvious gross SOB, gasping or wheezing  CV: no obvious cyanosis  MS: moves all visible extremities without noticeable abnormality  PSYCH/NEURO: pleasant and cooperative. She denies pain and states that she is doing ok and that she is comfortable, but is not able to answer questions beyond that.   Assessment/Plan  1. Agitation Patient is very agitated and has been more difficult to care for.  I am going to reach out to social work to see if they have any suggestions for home health aides that may be better trained to manage agitation with dementia.  I did advise using Ativan (half tablet of 0.5 mg) twice daily as needed for anxiety.  She had been limiting use previously since we did discuss that this medication is not always ideal for treatment of agitation and dementia.  From speaking with patient's daughter-in-law however, it seems that agitation is interfering in ability to get patient up and moving, feeding, and interaction with others.  We will give a try to using this medication more regularly in the next week, and she will update me with how she does from a behavior standpoint.  Suggest follow-up with neurology.  2. Dementia with behavioral disturbance See above.  3. Osteoporosis without current pathological fracture, unspecified osteoporosis type Ideally we would start her on Fosamax, with current agitation and requirement for sitting upright after taking medication, which she is not doing currently, I am going to hold off on treatment suggestions until we see how she does with Ativan.  With rapidly progressing dementia, there is also concern that time  of treatment required to see improvement in bone density may be less than lifespan.  He can make determination of this based on behavioral changes with above medication plan.  4. Localized swelling of right foot There are some bruising between toes.  I suspect she had her toe on the edge of furniture since she has not really been outside house recently. Elevate if possible, ice if possible. Let me know if worsening. She can move toes and walk without pain. Normal cap refill (squeezed by DIL on video visit).    Return for mychart update in 1-2 weeks..   I discussed the assessment and treatment plan with the patient. The patient was provided an opportunity to ask questions and all were answered. The patient agreed with the plan and demonstrated an understanding of the instructions.   The patient was advised to call back or seek an in-person evaluation if the symptoms worsen or if the condition fails to improve as anticipated.  I provided 30 minutes of face-to-face time during this encounter.   Theodis Shove, MD

## 2021-06-17 ENCOUNTER — Emergency Department (HOSPITAL_COMMUNITY)
Admission: EM | Admit: 2021-06-17 | Discharge: 2021-06-18 | Disposition: A | Discharge status: Home / Self Care | Payer: Medicare Other | Attending: Emergency Medicine | Admitting: Emergency Medicine

## 2021-06-17 ENCOUNTER — Emergency Department (HOSPITAL_COMMUNITY): Payer: Medicare Other

## 2021-06-17 ENCOUNTER — Encounter (HOSPITAL_COMMUNITY): Payer: Self-pay

## 2021-06-17 DIAGNOSIS — F0390 Unspecified dementia without behavioral disturbance: Secondary | ICD-10-CM | POA: Diagnosis not present

## 2021-06-17 DIAGNOSIS — R4182 Altered mental status, unspecified: Secondary | ICD-10-CM | POA: Diagnosis not present

## 2021-06-17 DIAGNOSIS — Z5321 Procedure and treatment not carried out due to patient leaving prior to being seen by health care provider: Secondary | ICD-10-CM | POA: Insufficient documentation

## 2021-06-17 DIAGNOSIS — R17 Unspecified jaundice: Secondary | ICD-10-CM | POA: Diagnosis not present

## 2021-06-17 DIAGNOSIS — R5383 Other fatigue: Secondary | ICD-10-CM | POA: Diagnosis not present

## 2021-06-17 LAB — COMPREHENSIVE METABOLIC PANEL
ALT: 14 U/L (ref 0–44)
AST: 17 U/L (ref 15–41)
Albumin: 3.8 g/dL (ref 3.5–5.0)
Alkaline Phosphatase: 72 U/L (ref 38–126)
Anion gap: 7 (ref 5–15)
BUN: 13 mg/dL (ref 8–23)
CO2: 27 mmol/L (ref 22–32)
Calcium: 9.5 mg/dL (ref 8.9–10.3)
Chloride: 102 mmol/L (ref 98–111)
Creatinine, Ser: 0.97 mg/dL (ref 0.44–1.00)
GFR, Estimated: >60 mL/min (ref >60)
Glucose, Bld: 127 mg/dL — ABNORMAL HIGH (ref 70–99)
Potassium: 3.8 mmol/L (ref 3.5–5.1)
Sodium: 136 mmol/L (ref 135–145)
Total Bilirubin: 0.6 mg/dL (ref 0.3–1.2)
Total Protein: 6.9 g/dL (ref 6.5–8.1)

## 2021-06-17 LAB — CBC
HCT: 40.7 % (ref 36.0–46.0)
Hemoglobin: 13.7 g/dL (ref 12.0–15.0)
MCH: 29.6 pg (ref 26.0–34.0)
MCHC: 33.7 g/dL (ref 30.0–36.0)
MCV: 87.9 fL (ref 80.0–100.0)
Platelets: 223 10*3/uL (ref 150–400)
RBC: 4.63 MIL/uL (ref 3.87–5.11)
RDW: 12.7 % (ref 11.5–15.5)
WBC: 7 10*3/uL (ref 4.0–10.5)
nRBC: 0 % (ref 0.0–0.2)

## 2021-06-17 LAB — AMMONIA: Ammonia: 11 umol/L (ref 9–35)

## 2021-06-17 LAB — LACTIC ACID, PLASMA: Lactic Acid, Venous: 0.9 mmol/L (ref 0.5–1.9)

## 2021-06-17 NOTE — ED Triage Notes (Signed)
Pt BIB [facility] for eval of altered mental status. Pt w/ hx of dementia, but otherwise active and able to ambulate. Last evening, became lethargic, and more altered than usual. ? Of some jaundice. A&Ox1 for EMS. Weak on transferring.

## 2021-06-18 NOTE — ED Notes (Signed)
Pt left with family due to long wait

## 2021-06-19 ENCOUNTER — Encounter: Payer: Self-pay | Admitting: Adult Health

## 2021-06-19 ENCOUNTER — Other Ambulatory Visit: Payer: Self-pay

## 2021-06-19 ENCOUNTER — Ambulatory Visit (INDEPENDENT_AMBULATORY_CARE_PROVIDER_SITE_OTHER): Payer: Medicare Other | Admitting: Adult Health

## 2021-06-19 VITALS — BP 110/80 | HR 99 | Temp 97.4°F

## 2021-06-19 DIAGNOSIS — F03918 Unspecified dementia, unspecified severity, with other behavioral disturbance: Secondary | ICD-10-CM

## 2021-06-19 NOTE — Progress Notes (Signed)
Subjective:    Patient ID: Donna Patrick, female    DOB: 09-Aug-1951, 70 y.o.   MRN: [id number]  HPI 70 year old female who  has a past medical history of Dementia (HCC), Hyperlipidemia, Incontinence, and Thyroid disease.  She is a patient of Dr. Hassan Rowan who I am seeing today for an acute issue. She presents to the office today with her son and caregiver   2 days ago her son had her up walking in her room doing some laps in order to get exercises.  Later on that evening she was unable to walk but her feet were moving like she was trying to walk.  Since that time family has been unable to get her up out of bed without carrying her that she has been unable to walk.  She will shuffle her feet but does nothing besides that.  Her son reports significant decline in her cognitive state over the last 90 days.  She was diagnosed with dementia roughly 8 to 9 years ago.    Review of Systems See HPI   Past Medical History:  Diagnosis Date  . Dementia (HCC)    diagnosed 8-10 years ago  . Hyperlipidemia   . Incontinence   . Thyroid disease     Social History   Socioeconomic History  . Marital status: Single    Spouse name: Not on file  . Number of children: Not on file  . Years of education: Not on file  . Highest education level: Not on file  Occupational History  . Not on file  Tobacco Use  . Smoking status: Former  . Smokeless tobacco: Never  Vaping Use  . Vaping Use: Never used  Substance and Sexual Activity  . Alcohol use: Never  . Drug use: Never  . Sexual activity: Not Currently  Other Topics Concern  . Not on file  Social History Narrative   Caffeine use:  None   Right handed   Lives with family   Social Determinants of Health   Financial Resource Strain: Low Risk   . Difficulty of Paying Living Expenses: Not hard at all  Food Insecurity: No Food Insecurity  . Worried About Programme researcher, broadcasting/film/video in the Last Year: Never true  . Ran Out of Food in the Last Year:  Never true  Transportation Needs: No Transportation Needs  . Lack of Transportation (Medical): No  . Lack of Transportation (Non-Medical): No  Physical Activity: Sufficiently Active  . Days of Exercise per Week: 7 days  . Minutes of Exercise per Session: 30 min  Stress: No Stress Concern Present  . Feeling of Stress : Not at all  Social Connections: Socially Isolated  . Frequency of Communication with Friends and Family: More than three times a week  . Frequency of Social Gatherings with Friends and Family: More than three times a week  . Attends Religious Services: Never  . Active Member of Clubs or Organizations: No  . Attends Banker Meetings: Never  . Marital Status: Never married  Intimate Partner Violence: Not At Risk  . Fear of Current or Ex-Partner: No  . Emotionally Abused: No  . Physically Abused: No  . Sexually Abused: No    Past Surgical History:  Procedure Laterality Date  . ABDOMINAL HYSTERECTOMY    . DUPUYTREN CONTRACTURE RELEASE Left     Family History  Problem Relation Age of Onset  . Dementia Mother   . Cancer Father   . Dementia  Brother   . Parkinson's disease Brother     No Known Allergies  Current Outpatient Medications on File Prior to Visit  Medication Sig Dispense Refill  . cholecalciferol (VITAMIN D3) 25 MCG (1000 UNIT) tablet Take 2,000 Units by mouth daily.    Marland Kitchen donepezil (ARICEPT) 5 MG tablet TAKE 1 TABLET(5 MG) BY MOUTH AT BEDTIME 90 tablet 1  . hydrOXYzine (ATARAX/VISTARIL) 25 MG tablet Take 1 tablet (25 mg total) by mouth 3 (three) times daily as needed. 90 tablet 5  . levothyroxine (SYNTHROID) 100 MCG tablet Take 1 tablet (100 mcg total) by mouth daily. 90 tablet 1  . LORazepam (ATIVAN) 0.5 MG tablet Take 1 tablet (0.5 mg total) by mouth 2 (two) times daily as needed for anxiety. 30 tablet 1  . meloxicam (MOBIC) 7.5 MG tablet Take 1 tablet (7.5 mg total) by mouth daily as needed for pain. 90 tablet 1  . memantine (NAMENDA) 10  MG tablet TAKE 1 TABLET(10 MG) BY MOUTH TWICE DAILY 90 tablet 1  . oxybutynin (DITROPAN-XL) 10 MG 24 hr tablet Take 1 tablet (10 mg total) by mouth daily. 90 tablet 1  . potassium chloride (KLOR-CON) 10 MEQ tablet TAKE 2 TABLETS BY MOUTH TWICE DAILY FOR 2 DAYS THEN TAKE 1 TABLET BY MOUTH EVERY DAY THEREAFTER 100 tablet 0  . risperiDONE (RISPERDAL) 1 MG tablet Take 1 tablet (1 mg total) by mouth 2 (two) times daily. 60 tablet 2  . simvastatin (ZOCOR) 40 MG tablet TAKE 1 TABLET(40 MG) BY MOUTH DAILY 90 tablet 1  . sulfamethoxazole-trimethoprim (BACTRIM DS) 800-160 MG tablet Take 1 tablet by mouth 2 (two) times daily. 6 tablet 0  . traZODone (DESYREL) 50 MG tablet Take 1 tablet (50 mg total) by mouth at bedtime as needed for sleep. 90 tablet 1  . triamcinolone ointment (KENALOG) 0.1 % SMARTSIG:Sparingly Topical Twice Daily    . trospium (SANCTURA) 20 MG tablet Take 1 tablet (20 mg total) by mouth 2 (two) times daily. 60 tablet 11   No current facility-administered medications on file prior to visit.    BP 110/80   Pulse 99   Temp (!) 97.4 F (36.3 C) (Oral)   SpO2 (!) 89%       Objective:   Physical Exam Vitals and nursing note reviewed.  Constitutional:      Appearance: Normal appearance.  Cardiovascular:     Rate and Rhythm: Normal rate and regular rhythm.     Pulses: Normal pulses.     Heart sounds: Normal heart sounds.  Pulmonary:     Effort: Pulmonary effort is normal.     Breath sounds: Normal breath sounds.  Musculoskeletal:        General: Normal range of motion.     Right lower leg: Edema (trace pitting edema) present.     Left lower leg: No edema.     Comments: Needed help standing up from wheelchair.  Was able to take 1 small step and then needed to sit back down.  Skin:    General: Skin is warm and dry.  Neurological:     Mental Status: She is alert.  Psychiatric:        Mood and Affect: Affect is flat.        Speech: Speech is delayed.        Behavior: Behavior  is slowed and withdrawn.        Cognition and Memory: Cognition is impaired. Memory is impaired. She exhibits impaired recent memory and impaired  remote memory.       Assessment & Plan:  1. Dementia with behavioral disturbance -Advised son that this is likely worsening disease process.  Would be happy to refer her to home health PT to help with strengthening exercises.  Advised to try and get up and walk is much as possible.  Paper prescription given to for rolling walker with a seat to have on hand if they decide to do at home physical therapy.  He will talk to his wife and follow-up with myself or Dr. Eloisa Northern line about home health PT  Shirline Frees, NP

## 2021-06-19 NOTE — Patient Instructions (Addendum)
It was great meeting you today   I am very sorry you are going through this. I think at home physical therapy may be helpful. Likely this will be a stage and she will start walking again and then will revert back to not walking.   Try and get her up as much as possible and have her try and walk   I will wait to hear from your about physical therapy

## 2021-06-20 ENCOUNTER — Telehealth: Payer: Self-pay | Admitting: Family Medicine

## 2021-06-20 DIAGNOSIS — F03918 Unspecified dementia, unspecified severity, with other behavioral disturbance: Secondary | ICD-10-CM

## 2021-06-20 DIAGNOSIS — R29898 Other symptoms and signs involving the musculoskeletal system: Secondary | ICD-10-CM

## 2021-06-20 NOTE — Telephone Encounter (Signed)
Donna Patrick was informed of the message below and approved the order for home health PT.  Spoke with the patient's daughter in law, informed her the referral was entered and someone will contact her regarding PT.

## 2021-06-20 NOTE — Telephone Encounter (Signed)
Pt daughter in law Dorisann Frames is calling and would like to proceed with patient having in home physical therapy. Pt seen cory yesterday

## 2021-06-21 ENCOUNTER — Telehealth: Payer: Self-pay | Admitting: *Deleted

## 2021-06-21 NOTE — Chronic Care Management (AMB) (Signed)
  Care Management   Note  06/21/2021 Name: Donna Patrick MRN: [id number] DOB: [birth year]  Donna Patrick is a 70 y.o. year old female who is a primary care patient of Koberlein, [name], MD and is actively engaged with the care management team. I reached out to Donna Patrick by phone today to assist with scheduling an initial visit with the Licensed Clinical Social Worker  Follow up plan: Telephone appointment with care management team member scheduled for:06/22/21  Gwenevere Ghazi  Care Guide, Embedded Care Coordination [facility]  Care Management  Direct Dial: [phone]

## 2021-06-21 NOTE — Progress Notes (Signed)
Scheduled 06/22/21  Gwenevere Ghazi  Care Guide, Embedded Care Coordination [place]  Care Management  Direct Dial: [phone]

## 2021-06-22 ENCOUNTER — Ambulatory Visit (INDEPENDENT_AMBULATORY_CARE_PROVIDER_SITE_OTHER): Payer: Medicare Other | Admitting: *Deleted

## 2021-06-22 DIAGNOSIS — E876 Hypokalemia: Secondary | ICD-10-CM

## 2021-06-22 DIAGNOSIS — M810 Age-related osteoporosis without current pathological fracture: Secondary | ICD-10-CM

## 2021-06-22 DIAGNOSIS — E039 Hypothyroidism, unspecified: Secondary | ICD-10-CM | POA: Diagnosis not present

## 2021-06-22 DIAGNOSIS — E785 Hyperlipidemia, unspecified: Secondary | ICD-10-CM

## 2021-06-22 DIAGNOSIS — R451 Restlessness and agitation: Secondary | ICD-10-CM

## 2021-06-22 DIAGNOSIS — F03918 Unspecified dementia, unspecified severity, with other behavioral disturbance: Secondary | ICD-10-CM

## 2021-06-22 DIAGNOSIS — R29898 Other symptoms and signs involving the musculoskeletal system: Secondary | ICD-10-CM

## 2021-06-24 NOTE — Chronic Care Management (AMB) (Signed)
Chronic Care Management    Clinical Social Work Note  06/24/2021 Name: Donna Patrick MRN: [id number] DOB: [birth year]  Donna Patrick is a 70 y.o. year old female who is a primary care patient of Koberlein, [name], MD. The CCM team was consulted to assist the patient with chronic disease management and/or care coordination needs related to: [employer], Mental Health Counseling and Resources, and Caregiver Stress.   Engaged with patient, son and daughter-in-law by telephone for initial visit in response to provider referral for social work chronic care management and care coordination services.   Consent to Services:  The patient was given information about Chronic Care Management services, agreed to services, and gave verbal consent prior to initiation of services.  Please see initial visit note for detailed documentation.   Patient agreed to services and consent obtained.   Assessment: Review of patient past medical history, allergies, medications, and health status, including review of relevant consultants reports was performed today as part of a comprehensive evaluation and provision of chronic care management and care coordination services.     SDOH (Social Determinants of Health) assessments and interventions performed:  SDOH Interventions    Flowsheet Row Most Recent Value  SDOH Interventions   Food Insecurity Interventions Intervention Not Indicated  Financial Strain Interventions Intervention Not Indicated  Housing Interventions Intervention Not Indicated  Intimate Partner Violence Interventions Intervention Not Indicated  Physical Activity Interventions Intervention Not Indicated  Stress Interventions Offered [employer] Resources, Provide Counseling, Patient Refused  Social Connections Interventions Patient Refused  Transportation Interventions Intervention Not Indicated        Advanced Directives Status: Not addressed in this encounter.  CCM Care  Plan  No Known Allergies  Outpatient Encounter Medications as of 06/22/2021  Medication Sig   cholecalciferol (VITAMIN D3) 25 MCG (1000 UNIT) tablet Take 2,000 Units by mouth daily.   donepezil (ARICEPT) 5 MG tablet TAKE 1 TABLET(5 MG) BY MOUTH AT BEDTIME   hydrOXYzine (ATARAX/VISTARIL) 25 MG tablet Take 1 tablet (25 mg total) by mouth 3 (three) times daily as needed.   levothyroxine (SYNTHROID) 100 MCG tablet Take 1 tablet (100 mcg total) by mouth daily.   LORazepam (ATIVAN) 0.5 MG tablet Take 1 tablet (0.5 mg total) by mouth 2 (two) times daily as needed for anxiety.   meloxicam (MOBIC) 7.5 MG tablet Take 1 tablet (7.5 mg total) by mouth daily as needed for pain.   memantine (NAMENDA) 10 MG tablet TAKE 1 TABLET(10 MG) BY MOUTH TWICE DAILY   oxybutynin (DITROPAN-XL) 10 MG 24 hr tablet Take 1 tablet (10 mg total) by mouth daily.   potassium chloride (KLOR-CON) 10 MEQ tablet TAKE 2 TABLETS BY MOUTH TWICE DAILY FOR 2 DAYS THEN TAKE 1 TABLET BY MOUTH EVERY DAY THEREAFTER   risperiDONE (RISPERDAL) 1 MG tablet Take 1 tablet (1 mg total) by mouth 2 (two) times daily.   simvastatin (ZOCOR) 40 MG tablet TAKE 1 TABLET(40 MG) BY MOUTH DAILY   sulfamethoxazole-trimethoprim (BACTRIM DS) 800-160 MG tablet Take 1 tablet by mouth 2 (two) times daily.   traZODone (DESYREL) 50 MG tablet Take 1 tablet (50 mg total) by mouth at bedtime as needed for sleep.   triamcinolone ointment (KENALOG) 0.1 % SMARTSIG:Sparingly Topical Twice Daily   trospium (SANCTURA) 20 MG tablet Take 1 tablet (20 mg total) by mouth 2 (two) times daily.   No facility-administered encounter medications on file as of 06/22/2021.    Patient Active Problem List   Diagnosis Date Noted  Agitation 01/09/2021   Hypothyroid 12/15/2020   B12 deficiency 12/15/2020   Hyperlipidemia 10/25/2020   Overactive bladder 10/25/2020   Dementia with behavioral disturbance 10/25/2020    Conditions to be addressed/monitored: Dementia and Caregiver  Stress.  Level of Care Concerns, ADL/IADL Limitations, Mental Health Concerns, Limited Access to Caregiver, Cognitive Deficits, Memory Deficits, and Lacks Knowledge of [employer].  Care Plan : LCSW Plan of Care  Updates made by Karolee Stamps, LCSW since 06/24/2021 12:00 AM     Problem: Keeping Myself and Loved One's Safe-Dementia.   Priority: High     Long-Range Goal: Keeping Myself and Loved One's Safe-Dementia.   Start Date: 06/22/2021  Expected End Date: 09/21/2021  This Visit's Progress: On track  Priority: High  Note:   Current Barriers: Patient with Dementia with Behavioral Disturbance, Agitation, and Caregiver Stress, requires support, education, resources, referrals, advocacy and case management services, to meet unmet caregiver needs in the home. Clinical Goal(s): Patient, son and daughter-in-law will work with LCSW, in an effort to develop safety and boundaries in the home, as well as implement a behavior modification plan. Clinical Interventions: LCSW collaboration with Primary Care Physician, Dr. Theodis Shove regarding development and update of comprehensive plan of care as evidenced by provider attestation and co-signature. Inter-disciplinary care team collaboration (see longitudinal plan of care). Assessed patient's previous treatment, needs, coping skills, current treatment, support system and barriers to care. PHQ-2 and PHQ-9 Depression Screening Tool performed and results reviewed with patient, son and daughter-in-law. Mindfulness Meditation Strategies, Relaxation Techniques and Deep Breathing Exercises taught and encouraged, by patient and caregivers, daily. Solution-Focused Therapy performed, Verbalization of Feelings encouraged, Emotional Support provided, Problem Solving Solutions developed, Brief Cognitive Behavioral Therapy initiated, Quality of Sleep assessed and Sleep Hygiene Techniques promoted, Goal-Setting facilitated, Family Involvement discussed,  Caregiver Support Group Participation encouraged, and Behavior Modification Strategies emphasized.    Discussed plans with patient, son and daughter-in-law for ongoing care management follow-up, and provided patient with direct contact information for care management team. Discussed several options for long-term counseling and supportive services, based on need and insurance, but patient, son, and daughter-in-law all deny the need for counseling services, of any kind, at this time.    Level of care options discussed, but son and daughter-in-law refuse to consider placement for patient outside of their home. Patient Goals/Self-Care Activities: Begin personal counseling with LCSW, on a weekly/bi-weekly basis, to try and reduce symptoms of agitation and aggression, associated with Dementia with Behavioral Disturbance.   Begin to consider in-home care services, although private pay, from the list provided. Caregiver Goals/Self-Care Activities:   Acknowledge and normalize patient's feelings of disempowerment, fear, frustration, diminished self-worth and withdrawal.  Express empathy over recent death of daughter, and encourage patient to express feelings, concerns and fears about her own demise.  Assess for factors that may be impacting patient's coping or adjustment, such as recent death of loved one.   Be clear and concise when communicating with patient, but also sensitive to her perception of information being received, this will help reduce confusion and anxiety.  Initiating steps to improve safety can prevent injuries, allow independence for a longer period of time, and help you and your loved ones feel more relaxed.  Avoid busy places that are confusing, such as shopping malls. Keep medicine safe and locked. Keep power tools, knives and cleaning products in a safe place. Put deadbolts, either high or low, on outside doors. Put car keys out of sight. Remove knobs from stove and  oven. Use  motion-sensor alarms outdoors, in the bedroom and the kitchen.  Follow-Up Date:  07/09/2021 at 1:45pm   Danford Bad LCSW Licensed Clinical Social Worker [facility] [phone]

## 2021-06-24 NOTE — Patient Instructions (Signed)
Visit Information   PATIENT GOALS/PLAN OF CARE:  Care Plan : LCSW Plan of Care  Updates made by Francis Gaines, LCSW since 06/24/2021 12:00 AM     Problem: Keeping Myself and Loved One's Safe-Dementia.   Priority: High     Long-Range Goal: Keeping Myself and Loved One's Safe-Dementia.   Start Date: 06/22/2021  Expected End Date: 09/21/2021  This Visit's Progress: On track  Priority: High  Note:   Current Barriers: Patient with Dementia with Behavioral Disturbance, Agitation, and Caregiver Stress, requires support, education, resources, referrals, advocacy and case management services, to meet unmet caregiver needs in the home. Clinical Goal(s): Patient, son and daughter-in-law will work with LCSW, in an effort to develop safety and boundaries in the home, as well as implement a behavior modification plan. Clinical Interventions: LCSW collaboration with Primary Care Physician, Dr. Micheline Rough regarding development and update of comprehensive plan of care as evidenced by provider attestation and co-signature. Inter-disciplinary care team collaboration (see longitudinal plan of care). Assessed patient's previous treatment, needs, coping skills, current treatment, support system and barriers to care. PHQ-2 and PHQ-9 Depression Screening Tool performed and results reviewed with patient, son and daughter-in-law. Mindfulness Meditation Strategies, Relaxation Techniques and Deep Breathing Exercises taught and encouraged, by patient and caregivers, daily. Solution-Focused Therapy performed, Verbalization of Feelings encouraged, Emotional Support provided, Problem Solving Solutions developed, Brief Cognitive Behavioral Therapy initiated, Quality of Sleep assessed and Sleep Hygiene Techniques promoted, Goal-Setting facilitated, Family Involvement discussed, Caregiver Support Group Participation encouraged, and Behavior Modification Strategies emphasized.    Discussed plans with patient, son and  daughter-in-law for ongoing care management follow-up, and provided patient with direct contact information for care management team. Discussed several options for long-term counseling and supportive services, based on need and insurance, but patient, son, and daughter-in-law all deny the need for counseling services, of any kind, at this time.    Level of care options discussed, but son and daughter-in-law refuse to consider placement for patient outside of their home. Patient Goals/Self-Care Activities: Begin personal counseling with LCSW, on a weekly/bi-weekly basis, to try and reduce symptoms of agitation and aggression, associated with Dementia with Behavioral Disturbance.   Begin to consider in-home care services, although private pay, from the list provided. Caregiver Goals/Self-Care Activities:   Acknowledge and normalize patient's feelings of disempowerment, fear, frustration, diminished self-worth and withdrawal.  Express empathy over recent death of daughter, and encourage patient to express feelings, concerns and fears about her own demise.  Assess for factors that may be impacting patient's coping or adjustment, such as recent death of loved one.   Be clear and concise when communicating with patient, but also sensitive to her perception of information being received, this will help reduce confusion and anxiety.  Initiating steps to improve safety can prevent injuries, allow independence for a longer period of time, and help you and your loved ones feel more relaxed.  Avoid busy places that are confusing, such as shopping malls. Keep medicine safe and locked. Keep power tools, knives and cleaning products in a safe place. Put deadbolts, either high or low, on outside doors. Put car keys out of sight. Remove knobs from stove and oven. Use motion-sensor alarms outdoors, in the bedroom and the kitchen.  Follow-Up Date:  07/09/2021 at 1:45pm     Consent to CCM Services: Donna Patrick  was given information about Chronic Care Management services including:  CCM service includes personalized support from designated clinical staff supervised by her physician, including individualized plan  of care and coordination with other care providers 24/7 contact phone numbers for assistance for urgent and routine care needs. Service will only be billed when office clinical staff spend 20 minutes or more in a month to coordinate care. Only one practitioner may furnish and bill the service in a calendar month. The patient may stop CCM services at any time (effective at the end of the month) by phone call to the office staff. The patient will be responsible for cost sharing (co-pay) of up to 20% of the service fee (after annual deductible is met).  Patient agreed to services and verbal consent obtained.   Patient verbalizes understanding of instructions provided today and agrees to view in [place].   Telephone follow up appointment with care management team member scheduled for:  07/09/2021 at 1:45pm  [place] Licensed Clinical Social Worker [place] [phone]

## 2021-06-27 ENCOUNTER — Inpatient Hospital Stay (HOSPITAL_COMMUNITY): Payer: Medicare Other

## 2021-06-27 ENCOUNTER — Ambulatory Visit: Payer: Medicare Other | Admitting: Family Medicine

## 2021-06-27 ENCOUNTER — Inpatient Hospital Stay (HOSPITAL_COMMUNITY)
Admission: EM | Admit: 2021-06-27 | Discharge: 2021-07-19 | DRG: 296 | Disposition: E | Payer: Medicare Other | Attending: Internal Medicine | Admitting: Internal Medicine

## 2021-06-27 ENCOUNTER — Emergency Department (HOSPITAL_COMMUNITY): Payer: Medicare Other

## 2021-06-27 ENCOUNTER — Encounter (HOSPITAL_COMMUNITY): Payer: Self-pay | Admitting: Pharmacy Technician

## 2021-06-27 DIAGNOSIS — F02B4 Dementia in other diseases classified elsewhere, moderate, with anxiety: Secondary | ICD-10-CM | POA: Diagnosis present

## 2021-06-27 DIAGNOSIS — E440 Moderate protein-calorie malnutrition: Secondary | ICD-10-CM | POA: Insufficient documentation

## 2021-06-27 DIAGNOSIS — Z515 Encounter for palliative care: Secondary | ICD-10-CM | POA: Diagnosis not present

## 2021-06-27 DIAGNOSIS — G9341 Metabolic encephalopathy: Secondary | ICD-10-CM

## 2021-06-27 DIAGNOSIS — I3139 Other pericardial effusion (noninflammatory): Secondary | ICD-10-CM | POA: Diagnosis present

## 2021-06-27 DIAGNOSIS — E039 Hypothyroidism, unspecified: Secondary | ICD-10-CM | POA: Diagnosis present

## 2021-06-27 DIAGNOSIS — Z66 Do not resuscitate: Secondary | ICD-10-CM

## 2021-06-27 DIAGNOSIS — R638 Other symptoms and signs concerning food and fluid intake: Secondary | ICD-10-CM | POA: Diagnosis not present

## 2021-06-27 DIAGNOSIS — J9601 Acute respiratory failure with hypoxia: Secondary | ICD-10-CM

## 2021-06-27 DIAGNOSIS — N179 Acute kidney failure, unspecified: Secondary | ICD-10-CM

## 2021-06-27 DIAGNOSIS — Z634 Disappearance and death of family member: Secondary | ICD-10-CM

## 2021-06-27 DIAGNOSIS — K7200 Acute and subacute hepatic failure without coma: Secondary | ICD-10-CM | POA: Diagnosis present

## 2021-06-27 DIAGNOSIS — Z711 Person with feared health complaint in whom no diagnosis is made: Secondary | ICD-10-CM | POA: Diagnosis not present

## 2021-06-27 DIAGNOSIS — Z79890 Hormone replacement therapy (postmenopausal): Secondary | ICD-10-CM

## 2021-06-27 DIAGNOSIS — F02B18 Dementia in other diseases classified elsewhere, moderate, with other behavioral disturbance: Secondary | ICD-10-CM | POA: Diagnosis present

## 2021-06-27 DIAGNOSIS — Z79899 Other long term (current) drug therapy: Secondary | ICD-10-CM

## 2021-06-27 DIAGNOSIS — Z23 Encounter for immunization: Secondary | ICD-10-CM

## 2021-06-27 DIAGNOSIS — R451 Restlessness and agitation: Secondary | ICD-10-CM | POA: Diagnosis not present

## 2021-06-27 DIAGNOSIS — Z0189 Encounter for other specified special examinations: Secondary | ICD-10-CM

## 2021-06-27 DIAGNOSIS — E872 Acidosis, unspecified: Secondary | ICD-10-CM | POA: Diagnosis present

## 2021-06-27 DIAGNOSIS — G253 Myoclonus: Secondary | ICD-10-CM | POA: Diagnosis present

## 2021-06-27 DIAGNOSIS — R946 Abnormal results of thyroid function studies: Secondary | ICD-10-CM | POA: Diagnosis present

## 2021-06-27 DIAGNOSIS — F03918 Unspecified dementia, unspecified severity, with other behavioral disturbance: Secondary | ICD-10-CM | POA: Diagnosis not present

## 2021-06-27 DIAGNOSIS — G309 Alzheimer's disease, unspecified: Secondary | ICD-10-CM | POA: Diagnosis present

## 2021-06-27 DIAGNOSIS — N139 Obstructive and reflux uropathy, unspecified: Secondary | ICD-10-CM

## 2021-06-27 DIAGNOSIS — E876 Hypokalemia: Secondary | ICD-10-CM

## 2021-06-27 DIAGNOSIS — I469 Cardiac arrest, cause unspecified: Secondary | ICD-10-CM | POA: Diagnosis present

## 2021-06-27 DIAGNOSIS — Z820 Family history of epilepsy and other diseases of the nervous system: Secondary | ICD-10-CM

## 2021-06-27 DIAGNOSIS — J690 Pneumonitis due to inhalation of food and vomit: Secondary | ICD-10-CM

## 2021-06-27 DIAGNOSIS — R000 Tachycardia, unspecified: Secondary | ICD-10-CM | POA: Diagnosis not present

## 2021-06-27 DIAGNOSIS — R1310 Dysphagia, unspecified: Secondary | ICD-10-CM | POA: Diagnosis present

## 2021-06-27 DIAGNOSIS — Z789 Other specified health status: Secondary | ICD-10-CM | POA: Diagnosis not present

## 2021-06-27 DIAGNOSIS — K828 Other specified diseases of gallbladder: Secondary | ICD-10-CM | POA: Diagnosis present

## 2021-06-27 DIAGNOSIS — Z20822 Contact with and (suspected) exposure to covid-19: Secondary | ICD-10-CM | POA: Diagnosis present

## 2021-06-27 DIAGNOSIS — Z7189 Other specified counseling: Secondary | ICD-10-CM | POA: Diagnosis not present

## 2021-06-27 DIAGNOSIS — Z90710 Acquired absence of both cervix and uterus: Secondary | ICD-10-CM

## 2021-06-27 DIAGNOSIS — Z87891 Personal history of nicotine dependence: Secondary | ICD-10-CM

## 2021-06-27 DIAGNOSIS — Z7401 Bed confinement status: Secondary | ICD-10-CM

## 2021-06-27 DIAGNOSIS — R569 Unspecified convulsions: Secondary | ICD-10-CM

## 2021-06-27 DIAGNOSIS — Z6822 Body mass index (BMI) 22.0-22.9, adult: Secondary | ICD-10-CM

## 2021-06-27 DIAGNOSIS — E785 Hyperlipidemia, unspecified: Secondary | ICD-10-CM | POA: Diagnosis present

## 2021-06-27 DIAGNOSIS — R609 Edema, unspecified: Secondary | ICD-10-CM | POA: Diagnosis not present

## 2021-06-27 DIAGNOSIS — R159 Full incontinence of feces: Secondary | ICD-10-CM | POA: Diagnosis present

## 2021-06-27 DIAGNOSIS — R262 Difficulty in walking, not elsewhere classified: Secondary | ICD-10-CM | POA: Diagnosis present

## 2021-06-27 DIAGNOSIS — J9602 Acute respiratory failure with hypercapnia: Secondary | ICD-10-CM | POA: Diagnosis present

## 2021-06-27 DIAGNOSIS — G931 Anoxic brain damage, not elsewhere classified: Secondary | ICD-10-CM | POA: Diagnosis present

## 2021-06-27 DIAGNOSIS — Z85828 Personal history of other malignant neoplasm of skin: Secondary | ICD-10-CM

## 2021-06-27 DIAGNOSIS — L89151 Pressure ulcer of sacral region, stage 1: Secondary | ICD-10-CM | POA: Diagnosis not present

## 2021-06-27 DIAGNOSIS — I10 Essential (primary) hypertension: Secondary | ICD-10-CM | POA: Diagnosis present

## 2021-06-27 DIAGNOSIS — R1110 Vomiting, unspecified: Secondary | ICD-10-CM

## 2021-06-27 DIAGNOSIS — L8990 Pressure ulcer of unspecified site, unspecified stage: Secondary | ICD-10-CM | POA: Insufficient documentation

## 2021-06-27 DIAGNOSIS — R32 Unspecified urinary incontinence: Secondary | ICD-10-CM | POA: Diagnosis present

## 2021-06-27 LAB — RESPIRATORY PANEL BY PCR

## 2021-06-27 LAB — HEMOGLOBIN A1C
Hgb A1c MFr Bld: 5.2 % (ref 4.8–5.6)
Mean Plasma Glucose: 102.54 mg/dL

## 2021-06-27 LAB — I-STAT ARTERIAL BLOOD GAS, ED
Acid-base deficit: 3 mmol/L — ABNORMAL HIGH (ref 0.0–2.0)
Acid-base deficit: 4 mmol/L — ABNORMAL HIGH (ref 0.0–2.0)
Bicarbonate: 25 mmol/L (ref 20.0–28.0)
Bicarbonate: 22.6 mmol/L (ref 20.0–28.0)
Calcium, Ion: 1.16 mmol/L (ref 1.15–1.40)
Calcium, Ion: 1.15 mmol/L (ref 1.15–1.40)
HCT: 31 % — ABNORMAL LOW (ref 36.0–46.0)
HCT: 32 % — ABNORMAL LOW (ref 36.0–46.0)
Hemoglobin: 10.5 g/dL — ABNORMAL LOW (ref 12.0–15.0)
Hemoglobin: 10.9 g/dL — ABNORMAL LOW (ref 12.0–15.0)
O2 Saturation: 100 %
O2 Saturation: 97 %
Patient temperature: 97.1
Patient temperature: 97.1
Potassium: 3.1 mmol/L — ABNORMAL LOW (ref 3.5–5.1)
Potassium: 3.7 mmol/L (ref 3.5–5.1)
Sodium: 142 mmol/L (ref 135–145)
Sodium: 141 mmol/L (ref 135–145)
TCO2: 27 mmol/L (ref 22–32)
TCO2: 24 mmol/L (ref 22–32)
pCO2 arterial: 56.4 mmHg — ABNORMAL HIGH (ref 32.0–48.0)
pCO2 arterial: 43.1 mmHg (ref 32.0–48.0)
pH, Arterial: 7.249 — ABNORMAL LOW (ref 7.350–7.450)
pH, Arterial: 7.324 — ABNORMAL LOW (ref 7.350–7.450)
pO2, Arterial: 464 mmHg — ABNORMAL HIGH (ref 83.0–108.0)
pO2, Arterial: 94 mmHg (ref 83.0–108.0)

## 2021-06-27 LAB — I-STAT CHEM 8, ED
BUN: 29 mg/dL — ABNORMAL HIGH (ref 8–23)
Calcium, Ion: 1.05 mmol/L — ABNORMAL LOW (ref 1.15–1.40)
Chloride: 105 mmol/L (ref 98–111)
Creatinine, Ser: 1.1 mg/dL — ABNORMAL HIGH (ref 0.44–1.00)
Glucose, Bld: 223 mg/dL — ABNORMAL HIGH (ref 70–99)
HCT: 36 % (ref 36.0–46.0)
Hemoglobin: 12.2 g/dL (ref 12.0–15.0)
Potassium: 4.5 mmol/L (ref 3.5–5.1)
Sodium: 137 mmol/L (ref 135–145)
TCO2: 22 mmol/L (ref 22–32)

## 2021-06-27 LAB — TROPONIN I (HIGH SENSITIVITY)
Troponin I (High Sensitivity): 37 ng/L — ABNORMAL HIGH (ref <18)
Troponin I (High Sensitivity): 146 ng/L (ref <18)
Troponin I (High Sensitivity): 126 ng/L (ref <18)

## 2021-06-27 LAB — RESP PANEL BY RT-PCR (FLU A&B, COVID) ARPGX2
Influenza A by PCR: NEGATIVE
Influenza B by PCR: NEGATIVE
SARS Coronavirus 2 by RT PCR: NEGATIVE

## 2021-06-27 LAB — ECHOCARDIOGRAM COMPLETE
AR max vel: 2.37 cm2
AV Area VTI: 2.18 cm2
AV Area mean vel: 2.29 cm2
AV Mean grad: 4 mmHg
AV Peak grad: 6.7 mmHg
Ao pk vel: 1.29 m/s
Area-P 1/2: 4.63 cm2
Height: 62 in
S' Lateral: 2.3 cm

## 2021-06-27 LAB — CBC WITH DIFFERENTIAL/PLATELET
Abs Immature Granulocytes: 0.54 10*3/uL — ABNORMAL HIGH (ref 0.00–0.07)
Basophils Absolute: 0.1 10*3/uL (ref 0.0–0.1)
Basophils Relative: 0 %
Eosinophils Absolute: 0.1 10*3/uL (ref 0.0–0.5)
Eosinophils Relative: 1 %
HCT: 38.8 % (ref 36.0–46.0)
Hemoglobin: 13.2 g/dL (ref 12.0–15.0)
Immature Granulocytes: 3 %
Lymphocytes Relative: 12 %
Lymphs Abs: 2.4 10*3/uL (ref 0.7–4.0)
MCH: 30.1 pg (ref 26.0–34.0)
MCHC: 34 g/dL (ref 30.0–36.0)
MCV: 88.6 fL (ref 80.0–100.0)
Monocytes Absolute: 1.2 10*3/uL — ABNORMAL HIGH (ref 0.1–1.0)
Monocytes Relative: 6 %
Neutro Abs: 16.5 10*3/uL — ABNORMAL HIGH (ref 1.7–7.7)
Neutrophils Relative %: 78 %
Platelets: 183 10*3/uL (ref 150–400)
RBC: 4.38 MIL/uL (ref 3.87–5.11)
RDW: 12.8 % (ref 11.5–15.5)
WBC: 20.9 10*3/uL — ABNORMAL HIGH (ref 4.0–10.5)
nRBC: 0.2 % (ref 0.0–0.2)

## 2021-06-27 LAB — URINALYSIS, ROUTINE W REFLEX MICROSCOPIC
Bilirubin Urine: NEGATIVE
Glucose, UA: NEGATIVE mg/dL
Hgb urine dipstick: NEGATIVE
Ketones, ur: NEGATIVE mg/dL
Leukocytes,Ua: NEGATIVE
Nitrite: NEGATIVE
Protein, ur: NEGATIVE mg/dL
Specific Gravity, Urine: 1.013 (ref 1.005–1.030)
pH: 5 (ref 5.0–8.0)

## 2021-06-27 LAB — TSH: TSH: 25.148 u[IU]/mL — ABNORMAL HIGH (ref 0.350–4.500)

## 2021-06-27 LAB — GLUCOSE, CAPILLARY
Glucose-Capillary: 103 mg/dL — ABNORMAL HIGH (ref 70–99)
Glucose-Capillary: 118 mg/dL — ABNORMAL HIGH (ref 70–99)
Glucose-Capillary: 144 mg/dL — ABNORMAL HIGH (ref 70–99)

## 2021-06-27 LAB — I-STAT VENOUS BLOOD GAS, ED
Acid-base deficit: 1 mmol/L (ref 0.0–2.0)
Bicarbonate: 22.6 mmol/L (ref 20.0–28.0)
Calcium, Ion: 1.04 mmol/L — ABNORMAL LOW (ref 1.15–1.40)
HCT: 36 % (ref 36.0–46.0)
Hemoglobin: 12.2 g/dL (ref 12.0–15.0)
O2 Saturation: 98 %
Potassium: 4.4 mmol/L (ref 3.5–5.1)
Sodium: 136 mmol/L (ref 135–145)
TCO2: 24 mmol/L (ref 22–32)
pCO2, Ven: 34.6 mmHg — ABNORMAL LOW (ref 44.0–60.0)
pH, Ven: 7.422 (ref 7.250–7.430)
pO2, Ven: 97 mmHg — ABNORMAL HIGH (ref 32.0–45.0)

## 2021-06-27 LAB — COMPREHENSIVE METABOLIC PANEL
ALT: 77 U/L — ABNORMAL HIGH (ref 0–44)
AST: 108 U/L — ABNORMAL HIGH (ref 15–41)
Albumin: 3.4 g/dL — ABNORMAL LOW (ref 3.5–5.0)
Alkaline Phosphatase: 90 U/L (ref 38–126)
Anion gap: 13 (ref 5–15)
BUN: 21 mg/dL (ref 8–23)
CO2: 20 mmol/L — ABNORMAL LOW (ref 22–32)
Calcium: 8.8 mg/dL — ABNORMAL LOW (ref 8.9–10.3)
Chloride: 103 mmol/L (ref 98–111)
Creatinine, Ser: 1.24 mg/dL — ABNORMAL HIGH (ref 0.44–1.00)
GFR, Estimated: 47 mL/min — ABNORMAL LOW (ref >60)
Glucose, Bld: 220 mg/dL — ABNORMAL HIGH (ref 70–99)
Potassium: 3.8 mmol/L (ref 3.5–5.1)
Sodium: 136 mmol/L (ref 135–145)
Total Bilirubin: 1.7 mg/dL — ABNORMAL HIGH (ref 0.3–1.2)
Total Protein: 6.3 g/dL — ABNORMAL LOW (ref 6.5–8.1)

## 2021-06-27 LAB — RAPID URINE DRUG SCREEN, HOSP PERFORMED
Amphetamines: NOT DETECTED
Barbiturates: NOT DETECTED
Benzodiazepines: NOT DETECTED
Cocaine: NOT DETECTED
Opiates: NOT DETECTED
Tetrahydrocannabinol: NOT DETECTED

## 2021-06-27 LAB — LACTIC ACID, PLASMA
Lactic Acid, Venous: 3.9 mmol/L (ref 0.5–1.9)
Lactic Acid, Venous: 3.6 mmol/L (ref 0.5–1.9)
Lactic Acid, Venous: 4.3 mmol/L (ref 0.5–1.9)

## 2021-06-27 MED ORDER — ETOMIDATE 2 MG/ML IV SOLN
INTRAVENOUS | Status: DC | PRN
  Administered 2021-06-27: 15 mg via INTRAVENOUS

## 2021-06-27 MED ORDER — ACETAMINOPHEN 160 MG/5ML PO SOLN
650.0000 mg | ORAL | Status: DC
  Administered 2021-06-27 – 2021-06-30 (×16): 650 mg
  Filled 2021-06-27 (×17): qty 20.3

## 2021-06-27 MED ORDER — SODIUM CHLORIDE 0.9 % IV SOLN
2.0000 g | Freq: Two times a day (BID) | INTRAVENOUS | Status: DC
  Administered 2021-06-27 – 2021-07-01 (×9): 2 g via INTRAVENOUS
  Filled 2021-06-27 (×10): qty 2

## 2021-06-27 MED ORDER — ONDANSETRON HCL 4 MG/2ML IJ SOLN
4.0000 mg | Freq: Four times a day (QID) | INTRAMUSCULAR | Status: DC | PRN
  Administered 2021-06-29: 4 mg via INTRAVENOUS
  Filled 2021-06-27: qty 2

## 2021-06-27 MED ORDER — PROPOFOL 1000 MG/100ML IV EMUL
5.0000 ug/kg/min | INTRAVENOUS | Status: DC
  Administered 2021-06-27: 10 ug/kg/min via INTRAVENOUS
  Filled 2021-06-27: qty 100

## 2021-06-27 MED ORDER — SODIUM CHLORIDE 0.9 % IV SOLN
250.0000 mL | INTRAVENOUS | Status: DC
  Administered 2021-06-27: 250 mL via INTRAVENOUS

## 2021-06-27 MED ORDER — HEPARIN SODIUM (PORCINE) 5000 UNIT/ML IJ SOLN
5000.0000 [IU] | Freq: Three times a day (TID) | INTRAMUSCULAR | Status: DC
  Administered 2021-06-27 – 2021-07-09 (×36): 5000 [IU] via SUBCUTANEOUS
  Filled 2021-06-27 (×35): qty 1

## 2021-06-27 MED ORDER — VANCOMYCIN HCL 750 MG/150ML IV SOLN
750.0000 mg | INTRAVENOUS | Status: DC
  Filled 2021-06-27: qty 150

## 2021-06-27 MED ORDER — VANCOMYCIN HCL 1250 MG/250ML IV SOLN
1250.0000 mg | Freq: Once | INTRAVENOUS | Status: AC
  Administered 2021-06-27: 1250 mg via INTRAVENOUS
  Filled 2021-06-27: qty 250

## 2021-06-27 MED ORDER — MAGNESIUM SULFATE 2 GM/50ML IV SOLN
2.0000 g | Freq: Once | INTRAVENOUS | Status: AC
  Administered 2021-06-27: 2 g via INTRAVENOUS
  Filled 2021-06-27: qty 50

## 2021-06-27 MED ORDER — POLYETHYLENE GLYCOL 3350 17 G PO PACK
17.0000 g | PACK | Freq: Every day | ORAL | Status: DC
  Administered 2021-06-27 – 2021-07-01 (×5): 17 g
  Filled 2021-06-27 (×5): qty 1

## 2021-06-27 MED ORDER — DOCUSATE SODIUM 50 MG/5ML PO LIQD
100.0000 mg | Freq: Two times a day (BID) | ORAL | Status: DC
  Administered 2021-06-27 – 2021-07-01 (×7): 100 mg
  Filled 2021-06-27 (×7): qty 10

## 2021-06-27 MED ORDER — FENTANYL CITRATE PF 50 MCG/ML IJ SOSY: 25.0000 ug | PREFILLED_SYRINGE | INTRAMUSCULAR | Status: DC | PRN

## 2021-06-27 MED ORDER — MAGNESIUM SULFATE 2 GM/50ML IV SOLN
2.0000 g | Freq: Once | INTRAVENOUS | Status: DC
  Filled 2021-06-27: qty 50

## 2021-06-27 MED ORDER — SODIUM CHLORIDE 0.9 % IV SOLN
2.0000 g | INTRAVENOUS | Status: DC
  Administered 2021-06-27: 2 g via INTRAVENOUS

## 2021-06-27 MED ORDER — ROCURONIUM BROMIDE 50 MG/5ML IV SOLN
INTRAVENOUS | Status: DC | PRN
Start: 2021-06-27 — End: 2021-06-27
  Administered 2021-06-27: 70 mg via INTRAVENOUS

## 2021-06-27 MED ORDER — POTASSIUM CHLORIDE 10 MEQ/100ML IV SOLN
10.0000 meq | INTRAVENOUS | Status: AC
  Administered 2021-06-27 (×2): 10 meq via INTRAVENOUS

## 2021-06-27 MED ORDER — PANTOPRAZOLE 2 MG/ML SUSPENSION
40.0000 mg | Freq: Every day | ORAL | Status: DC
  Administered 2021-06-27 – 2021-07-01 (×4): 40 mg
  Filled 2021-06-27 (×5): qty 20

## 2021-06-27 MED ORDER — FENTANYL CITRATE PF 50 MCG/ML IJ SOSY
25.0000 ug | PREFILLED_SYRINGE | INTRAMUSCULAR | Status: DC | PRN
  Administered 2021-06-27 (×3): 100 ug via INTRAVENOUS
  Filled 2021-06-27 (×3): qty 2

## 2021-06-27 MED ORDER — MIDAZOLAM HCL 2 MG/2ML IJ SOLN: 2.0000 mg | Freq: Once | INTRAMUSCULAR | Status: AC

## 2021-06-27 MED ORDER — MIDAZOLAM HCL 2 MG/2ML IJ SOLN: 2.0000 mg | INTRAMUSCULAR | Status: DC | PRN

## 2021-06-27 MED ORDER — ACETAMINOPHEN 650 MG RE SUPP: 650.0000 mg | RECTAL | Status: DC

## 2021-06-27 MED ORDER — ACETAMINOPHEN 325 MG PO TABS
650.0000 mg | ORAL_TABLET | ORAL | Status: DC
  Administered 2021-07-02 – 2021-07-05 (×11): 650 mg via ORAL
  Filled 2021-06-27 (×11): qty 2

## 2021-06-27 MED ORDER — LEVETIRACETAM IN NACL 500 MG/100ML IV SOLN
500.0000 mg | Freq: Two times a day (BID) | INTRAVENOUS | Status: DC
Start: 2021-06-27 — End: 2021-06-30
  Administered 2021-06-27 – 2021-06-30 (×6): 500 mg via INTRAVENOUS
  Filled 2021-06-27 (×6): qty 100

## 2021-06-27 MED ORDER — MIDAZOLAM HCL 2 MG/2ML IJ SOLN
INTRAMUSCULAR | Status: AC
  Administered 2021-06-27: 2 mg
  Filled 2021-06-27: qty 2

## 2021-06-27 MED ORDER — INSULIN ASPART 100 UNIT/ML IJ SOLN
0.0000 [IU] | INTRAMUSCULAR | Status: DC
  Administered 2021-06-27 – 2021-07-02 (×6): 2 [IU] via SUBCUTANEOUS

## 2021-06-27 MED ORDER — POTASSIUM CHLORIDE 10 MEQ/100ML IV SOLN
10.0000 meq | INTRAVENOUS | Status: AC
  Administered 2021-06-27 (×2): 10 meq via INTRAVENOUS
  Filled 2021-06-27 (×5): qty 100

## 2021-06-27 MED ORDER — SODIUM CHLORIDE 0.9 % IV BOLUS
1000.0000 mL | Freq: Once | INTRAVENOUS | Status: AC
  Administered 2021-06-27: 1000 mL via INTRAVENOUS

## 2021-06-27 MED ORDER — FENTANYL CITRATE (PF) 100 MCG/2ML IJ SOLN
25.0000 ug | INTRAMUSCULAR | Status: DC | PRN
  Administered 2021-06-29 (×3): 25 ug via INTRAVENOUS
  Filled 2021-06-27 (×3): qty 2

## 2021-06-27 MED ORDER — FENTANYL CITRATE (PF) 100 MCG/2ML IJ SOLN
25.0000 ug | INTRAMUSCULAR | Status: AC | PRN
  Administered 2021-06-28 – 2021-06-29 (×3): 25 ug via INTRAVENOUS
  Filled 2021-06-27 (×3): qty 2

## 2021-06-27 MED ORDER — NOREPINEPHRINE 4 MG/250ML-% IV SOLN
2.0000 ug/min | INTRAVENOUS | Status: DC
  Administered 2021-06-27: 2 ug/min via INTRAVENOUS
  Filled 2021-06-27: qty 250

## 2021-06-27 NOTE — Progress Notes (Signed)
EEG complete - results pending 

## 2021-06-27 NOTE — Progress Notes (Signed)
Per MD order, RT pulled back 3cm on ETT. ETT now 20 at the lip.

## 2021-06-27 NOTE — Progress Notes (Signed)
  Echocardiogram 2D Echocardiogram has been performed.  Roosvelt Maser F 07/07/2021, 3:15 PM

## 2021-06-27 NOTE — Progress Notes (Signed)
Pharmacy Antibiotic Note  Donna Patrick is a 70 y.o. female admitted on 07/09/2021 presenting post-arrest, concern for sepsis Pharmacy has been consulted for vancomycin and cefepime dosing.  Plan: Vancomycin 1250 mg IV x 1, then 750 mg IV q 24h (eAUC 479, Goal AUC 400-550, SCr 1.24 on CMET not iSTAT) Cefepime 2g IV q 12h Monitor renal function, Cx and clinical progression to narrow Vancomycin levels  Height: 5\' 2"  (157.5 cm) IBW/kg (Calculated) : 50.1  Temp (24hrs), Avg:97.1 F (36.2 C), Min:97.1 F (36.2 C), Max:97.1 F (36.2 C)  Recent Labs  Lab 06/30/2021 1023 06/29/2021 1035  WBC 20.9*  --   CREATININE 1.24* 1.10*  LATICACIDVEN 3.9*  --     Estimated Creatinine Clearance: 43.7 mL/min (A) (by C-G formula based on SCr of 1.1 mg/dL (H)).    No Known Allergies  13/09/22, PharmD Clinical Pharmacist ED Pharmacist Phone # [phone] 06/26/2021 12:58 PM

## 2021-06-27 NOTE — Progress Notes (Signed)
LTM EEG hooked up and running - no initial skin breakdown - push button tested - neuro notified. [facility] monitoring.  

## 2021-06-27 NOTE — Progress Notes (Signed)
RT assisted with transportation of this pt from RESUS in ED to CT room 3 while on full ventilatory support. Pt tolerated well with SVS and no complications. RN at bedside.

## 2021-06-27 NOTE — ED Triage Notes (Signed)
Pt bib ems from home after witnessed cardiac arrest by nephew. Nephew initiated CPR. PEA (wide) on ems arrival, CPR X15 minutes, given 1 epi. Pt given 500cc NS en route. CBG 232.

## 2021-06-27 NOTE — Progress Notes (Signed)
Patient OTF at this time. Will attempt EEG as schedule permits.

## 2021-06-27 NOTE — Progress Notes (Signed)
Lower extremity venous has been completed.   Preliminary results in CV Proc.   Donna Patrick 07/27/21 1:17 PM

## 2021-06-27 NOTE — ED Notes (Signed)
7.5 ett, 23 at the lip, +color change, bil breath sounds 

## 2021-06-27 NOTE — Procedures (Signed)
Patient Name: Donna Patrick  MRN: [id number]  Epilepsy Attending: Charlsie Quest  Referring Physician/Provider: Tobey Grim, NP Date: 06/19/2021 Duration: 22.32 mins  Patient history: 70 year old female who presented to the emergency department status post PEA cardiac arrest after she became unresponsive in the bathroom, witnessed by nephew, who started CPR, ROSC was achieved after 15 minutes, she received 1 amp of epi. EEG to evaluate for seizure  Level of alertness:comatose  AEDs during EEG study: LEV, propofol  Technical aspects: This EEG study was done with scalp electrodes positioned according to the 10-20 International system of electrode placement. Electrical activity was acquired at a sampling rate of 500Hz  and reviewed with a high frequency filter of 70Hz  and a low frequency filter of 1Hz . EEG data were recorded continuously and digitally stored.   Description: EEG showed continuous generalized low amplitude 1-3 Hz delta slowing. EEG was reactive to noxious stimulation and showed 5-6 Hz theta  slowing. Hyperventilation and photic stimulation were not performed.     ABNORMALITY - Continuous slow, generalized  IMPRESSION: This study is suggestive of severe diffuse encephalopathy, nonspecific etiology but likely related to sedation, anoxic/hypoxic brain injury. No seizures or epileptiform discharges were seen throughout the recording.  Anup Brigham 

## 2021-06-27 NOTE — Progress Notes (Signed)
Arrived at ED.Spoke with RN and NP on the floor. This order automatically populates per RN when vasopressors are ordered. Pt currently has PIV's.

## 2021-06-27 NOTE — ED Notes (Signed)
Echo at the bedside °

## 2021-06-27 NOTE — ED Notes (Signed)
Wound noted to right upper gum.

## 2021-06-27 NOTE — ED Provider Notes (Signed)
[facility] EMERGENCY DEPARTMENT Provider Note   CSN: [id number] Arrival date & time: 07/06/2021  1016     History Chief Complaint  Patient presents with   Cardiac Arrest    Donna Patrick is a 70 y.o. female.   Cardiac Arrest Patient presents with cardiac arrest.  Reportedly witnessed by nephew.  Had gone to the bathroom heard to flush and family went in and found her unresponsive on the toilet.  CPR started.  No trauma.  Reportedly was a wide PEA upon EMS getting there.  Fire had put an AED on her and no shock was advised.  After around 15 minutes had return to vitals.  Slightly low blood pressure.  Sugar was in the 200s.  Reportedly has some baseline dementia.  Family is not here.  Patient got 1 epi by EMS.  King airway had been placed.  Breathing spontaneously but really no other neuro function    Past Medical History:  Diagnosis Date   Dementia ([place])    diagnosed 8-10 years ago   Hyperlipidemia    Incontinence    Thyroid disease     Patient Active Problem List   Diagnosis Date Noted   Cardiac arrest ([place]) 07/03/2021   Agitation 01/09/2021   Hypothyroid 12/15/2020   B12 deficiency 12/15/2020   Hyperlipidemia 10/25/2020   Overactive bladder 10/25/2020   Dementia with behavioral disturbance 10/25/2020    Past Surgical History:  Procedure Laterality Date   ABDOMINAL HYSTERECTOMY     DUPUYTREN CONTRACTURE RELEASE Left      OB History   No obstetric history on file.     Family History  Problem Relation Age of Onset   Dementia Mother    Cancer Father    Dementia Brother    Parkinson's disease Brother     Social History   Tobacco Use   Smoking status: Former    Passive exposure: Past   Smokeless tobacco: Never  Scientific laboratory technician Use: Never used  Substance Use Topics   Alcohol use: Never   Drug use: Never    Home Medications Prior to Admission medications   Medication Sig Start Date End Date Taking? Authorizing Provider   cholecalciferol (VITAMIN D3) 25 MCG (1000 UNIT) tablet Take 2,000 Units by mouth daily.    [provider]  donepezil (ARICEPT) 5 MG tablet TAKE 1 TABLET(5 MG) BY MOUTH AT BEDTIME 06/04/21   Koberlein, Steele Berg, MD  hydrOXYzine (ATARAX/VISTARIL) 25 MG tablet Take 1 tablet (25 mg total) by mouth 3 (three) times daily as needed. 04/03/21   Sater, Nanine Means, MD  levothyroxine (SYNTHROID) 100 MCG tablet Take 1 tablet (100 mcg total) by mouth daily. 03/28/21   Koberlein, Steele Berg, MD  LORazepam (ATIVAN) 0.5 MG tablet Take 1 tablet (0.5 mg total) by mouth 2 (two) times daily as needed for anxiety. 06/04/21   Caren Macadam, MD  meloxicam (MOBIC) 7.5 MG tablet Take 1 tablet (7.5 mg total) by mouth daily as needed for pain. 05/18/21   Koberlein, Steele Berg, MD  memantine (NAMENDA) 10 MG tablet TAKE 1 TABLET(10 MG) BY MOUTH TWICE DAILY 05/10/21   Koberlein, Andris Flurry C, MD  oxybutynin (DITROPAN-XL) 10 MG 24 hr tablet Take 1 tablet (10 mg total) by mouth daily. 12/25/20   Koberlein, Steele Berg, MD  potassium chloride (KLOR-CON) 10 MEQ tablet TAKE 2 TABLETS BY MOUTH TWICE DAILY FOR 2 DAYS THEN TAKE 1 TABLET BY MOUTH EVERY DAY THEREAFTER 06/06/21   Koberlein,  [name], MD  risperiDONE (RISPERDAL) 1 MG tablet Take 1 tablet (1 mg total) by mouth 2 (two) times daily. 03/26/21   Koberlein, [name], MD  simvastatin (ZOCOR) 40 MG tablet TAKE 1 TABLET(40 MG) BY MOUTH DAILY 06/06/21   Koberlein, Jannette Spanner C, MD  sulfamethoxazole-trimethoprim (BACTRIM DS) 800-160 MG tablet Take 1 tablet by mouth 2 (two) times daily. 03/26/21   Wynn Banker, MD  traZODone (DESYREL) 50 MG tablet Take 1 tablet (50 mg total) by mouth at bedtime as needed for sleep. 03/26/21   Wynn Banker, MD  triamcinolone ointment (KENALOG) 0.1 % SMARTSIG:Sparingly Topical Twice Daily 01/18/21   [provider]  trospium (SANCTURA) 20 MG tablet Take 1 tablet (20 mg total) by mouth 2 (two) times daily. 01/09/21   Sater, Pearletha Furl, MD     Allergies    Patient has no known allergies.  Review of Systems   Review of Systems  Unable to perform ROS: Patient unresponsive   Physical Exam Updated Vital Signs BP (!) 143/74   Pulse (!) 109   Temp (!) 97.1 F (36.2 C) (Temporal)   Resp (!) 24   Ht 5\' 2"  (1.575 m)   SpO2 100%   BMI 26.61 kg/m   Physical Exam Vitals and nursing note reviewed.  Constitutional:      Appearance: She is not diaphoretic.  HENT:     Head: Atraumatic.     Mouth/Throat:     Comments: King airway in place.  Does have some injury to right upper gums. Eyes:     Comments: Pupils are round 3 mm and unresponsive.  No threat reflex.  Cardiovascular:     Rate and Rhythm: Tachycardia present.  Pulmonary:     Comments: Equal breath sounds bilaterally but somewhat harsh. Abdominal:     Tenderness: There is no abdominal tenderness.     Comments: Feels somewhat full in the mid abdomen.  Musculoskeletal:     Cervical back: Neck supple.     Comments: I/O line in right lower leg  Skin:    General: Skin is warm.  Neurological:     Comments: Breathing spontaneously.  No response to pain.  No threat reflex on either eye.  No movement seen.    ED Results / Procedures / Treatments   Labs (all labs ordered are listed, but only abnormal results are displayed) Labs Reviewed  COMPREHENSIVE METABOLIC PANEL - Abnormal; Notable for the following components:      Result Value   CO2 20 (*)    Glucose, Bld 220 (*)    Creatinine, Ser 1.24 (*)    Calcium 8.8 (*)    Total Protein 6.3 (*)    Albumin 3.4 (*)    AST 108 (*)    ALT 77 (*)    Total Bilirubin 1.7 (*)    GFR, Estimated 47 (*)    All other components within normal limits  CBC WITH DIFFERENTIAL/PLATELET - Abnormal; Notable for the following components:   WBC 20.9 (*)    Neutro Abs 16.5 (*)    Monocytes Absolute 1.2 (*)    Abs Immature Granulocytes 0.54 (*)    All other components within normal limits  TSH - Abnormal; Notable for the  following components:   TSH 25.148 (*)    All other components within normal limits  LACTIC ACID, PLASMA - Abnormal; Notable for the following components:   Lactic Acid, Venous 3.9 (*)    All other components within normal limits  I-STAT VENOUS BLOOD GAS, ED - Abnormal; Notable for the following components:   pCO2, Ven 34.6 (*)    pO2, Ven 97.0 (*)    Calcium, Ion 1.04 (*)    All other components within normal limits  I-STAT CHEM 8, ED - Abnormal; Notable for the following components:   BUN 29 (*)    Creatinine, Ser 1.10 (*)    Glucose, Bld 223 (*)    Calcium, Ion 1.05 (*)    All other components within normal limits  I-STAT ARTERIAL BLOOD GAS, ED - Abnormal; Notable for the following components:   pH, Arterial 7.249 (*)    pCO2 arterial 56.4 (*)    pO2, Arterial 464 (*)    Acid-base deficit 3.0 (*)    Potassium 3.1 (*)    HCT 31.0 (*)    Hemoglobin 10.5 (*)    All other components within normal limits  TROPONIN I (HIGH SENSITIVITY) - Abnormal; Notable for the following components:   Troponin I (High Sensitivity) 37 (*)    All other components within normal limits  RESP PANEL BY RT-PCR (FLU A&B, COVID) ARPGX2  CULTURE, BLOOD (ROUTINE X 2)  CULTURE, BLOOD (ROUTINE X 2)  RESPIRATORY PANEL BY PCR  URINALYSIS, ROUTINE W REFLEX MICROSCOPIC  LACTIC ACID, PLASMA  RAPID URINE DRUG SCREEN, HOSP PERFORMED  BLOOD GAS, ARTERIAL  HEMOGLOBIN A1C  HIV ANTIBODY (ROUTINE TESTING W REFLEX)  TROPONIN I (HIGH SENSITIVITY)    EKG None  Radiology CT HEAD WO CONTRAST (5MM)  Result Date: 06/22/2021 CLINICAL DATA:  Mental status change, unknown cause EXAM: CT HEAD WITHOUT CONTRAST TECHNIQUE: Contiguous axial images were obtained from the base of the skull through the vertex without intravenous contrast. COMPARISON:  06/17/2021 FINDINGS: Brain: No evidence of acute infarction, hemorrhage, hydrocephalus, extra-axial collection or mass lesion/mass effect. Moderate low-density changes within the  periventricular and subcortical white matter compatible with chronic microvascular ischemic change. Mild diffuse cerebral volume loss. Vascular: No hyperdense vessel or unexpected calcification. Skull: Normal. Negative for fracture or focal lesion. Sinuses/Orbits: No acute finding. Other: Partially visualized tubing within the posterior oropharynx, likely orogastric tube. IMPRESSION: 1. No acute intracranial findings. 2. Chronic microvascular ischemic change and cerebral volume loss. Electronically Signed   By: Davina Poke D.O.   On: 06/19/2021 11:39   DG Chest Portable 1 View  Result Date: 07/06/2021 CLINICAL DATA:  Cardiac arrest, status post CPR EXAM: PORTABLE CHEST 1 VIEW COMPARISON:  05/19/2021 FINDINGS: Endotracheal tube terminates 11 mm above the carina. Nasogastric tube terminates at the body of the stomach with side port below the gastroesophageal junction. External pacer/defibrillator. Normal heart size. Atherosclerosis in the transverse aorta. No pleural effusion or pneumothorax. Perihilar and slightly upper lobe predominant interstitial and airspace disease centrally. IMPRESSION: Interstitial and airspace disease is mild, suspicious for pulmonary edema. Early infection or aspiration could look similar. Endotracheal tube tip 11 mm above the carina. Consider retraction 3-4 cm. Aortic Atherosclerosis (ICD10-I70.0). Electronically Signed   By: Abigail Miyamoto M.D.   On: 06/22/2021 10:44    Procedures Procedure Name: Intubation Date/Time: 07/17/2021 10:30 AM Performed by: Davonna Belling, MD Pre-anesthesia Checklist: Patient identified, Suction available, Emergency Drugs available and Patient being monitored Preoxygenation: Pre-oxygenation with 100% oxygen Induction Type: Rapid sequence Laryngoscope Size: Glidescope and 3 Grade View: Grade II Tube type: Subglottic suction tube Tube size: 7.5 mm Number of attempts: 1 Placement Confirmation: ETT inserted through vocal cords under direct  vision, CO2 detector and Breath sounds checked- equal and bilateral Secured at: 23  cm Tube secured with: ETT holder Dental Injury: Teeth and Oropharynx as per pre-operative assessment  Comments: May change to ET tube.  Inserted without difficulty.  Vocal cords placed between the 2 lines on the tube.  Equal breath sounds.  Chest x-ray however showed tube may be deep.  Informed ICU will adjust tube placement. Did have bloody gum on maxilla.  Was present prior to my intubation attempt.      Medications Ordered in ED Medications  etomidate (AMIDATE) injection (15 mg Intravenous Given 06/28/2021 1028)  rocuronium (ZEMURON) injection (70 mg Intravenous Given 06/26/2021 1028)  fentaNYL (SUBLIMAZE) injection 25 mcg (has no administration in time range)  fentaNYL (SUBLIMAZE) injection 25-100 mcg (100 mcg Intravenous Given 07/04/2021 1229)  cefTRIAXone (ROCEPHIN) 2 g in sodium chloride 0.9 % 100 mL IVPB (2 g Intravenous New Bag/Given 07/07/2021 1132)  docusate (COLACE) 50 MG/5ML liquid 100 mg (has no administration in time range)  polyethylene glycol (MIRALAX / GLYCOLAX) packet 17 g (has no administration in time range)  ondansetron (ZOFRAN) injection 4 mg (has no administration in time range)  acetaminophen (TYLENOL) tablet 650 mg (has no administration in time range)    Or  acetaminophen (TYLENOL) 160 MG/5ML solution 650 mg (has no administration in time range)    Or  acetaminophen (TYLENOL) suppository 650 mg (has no administration in time range)  magnesium sulfate IVPB 2 g 50 mL (has no administration in time range)  norepinephrine (LEVOPHED) 4mg  in 222mL premix infusion (has no administration in time range)  0.9 %  sodium chloride infusion (has no administration in time range)  heparin injection 5,000 Units (has no administration in time range)  pantoprazole sodium (PROTONIX) 40 mg/20 mL oral suspension 40 mg (has no administration in time range)  insulin aspart (novoLOG) injection 0-15 Units (has no  administration in time range)  sodium chloride 0.9 % bolus 1,000 mL (0 mLs Intravenous Stopped 06/25/2021 1117)  sodium chloride 0.9 % bolus 1,000 mL (1,000 mLs Intravenous New Bag/Given 07/16/2021 1127)    ED Course  I have reviewed the triage vital signs and the nursing notes.  Pertinent labs & imaging results that were available during my care of the patient were reviewed by me and considered in my medical decision making (see chart for details).    MDM Rules/Calculators/A&P                           Patient's family member here.  Reportedly has had somewhat decreased oral intake recently had been urinating rather frequently.  Thinks she could be dehydrated.  Have been complaining of pain somewhere today but due to dementia was unable to determine where the pain was.  Presented after cardiac arrest.  About 15 minutes of downtime but sounds as if CPR immediately started.  Did have some hypotension improved with fluids.  Reportedly was a wide-complex PEA initially.  King airway replaced with ET tube.  Blood pressure now improved.  Remains intubated.  Family does not want aggressive measures at this time.  Will discuss with ICU. Chest x-ray showed infiltrate versus CHF.  Will start antibiotics since white count is elevated although could just be reactive due to CPR/cardiac arrest.  Blood pressures improved with fluids.  Seen by ICU.  Admit.  CRITICAL CARE Performed by: Davonna Belling Total critical care time: 30 minutes Critical care time was exclusive of separately billable procedures and treating other patients. Critical care was necessary to treat or prevent imminent  or life-threatening deterioration. Critical care was time spent personally by me on the following activities: development of treatment plan with patient and/or surrogate as well as nursing, discussions with consultants, evaluation of patient's response to treatment, examination of patient, obtaining history from patient or  surrogate, ordering and performing treatments and interventions, ordering and review of laboratory studies, ordering and review of radiographic studies, pulse oximetry and re-evaluation of patient's condition.  Final Clinical Impression(s) / ED Diagnoses Final diagnoses:  None    Rx / DC Orders ED Discharge Orders     None        Davonna Belling, MD 07/16/2021 1234

## 2021-06-27 NOTE — H&P (Signed)
NAME:  Donna Patrick, MRN:  [id number], DOB:  1951/07/11, LOS: 0 ADMISSION DATE:  06/23/2021, CONSULTATION DATE:  07/04/2021 REFERRING MD:  Dr. Rubin Payor, CHIEF COMPLAINT:  Cardiac Arrest    History of Present Illness:  70 year old female with Dementia, presents to ED on 11/9 s/p Cardiac Arrest. Per family patient at baseline at time forgets family names, typically can perform ADLs with cues however over the last few weeks with progressive weakness requiring assistance to bathroom. Family further states that patient's daughter passed 3 weeks ago and for the last 3 days patient has not been taking medications and has had poor oral intake, stating she is ready to die. This AM son was getting patient ready to go to PCP, (concern for UTI with dark colored urine and foul smell), when she was in the bathroom and went unresponsive, son was with patient through this event, took her to bedroom and started CPR, he stated he did this for about 7 minutes prior to EMS arrival, a couple of times he reports she took "big deep breaths" before collapsing again. When EMS arrived patient was PEA, continued CPR for about 15 minutes prior to ROSC.   On arrival to ED is breathing spontaneously however remains unresponsive. Intubated for airway protection. CT head negative for acute process. Critical Care consulted for admission.   Pertinent  Medical History  Dementia, HLD, Hypothyroidism   Significant Hospital Events: Including procedures, antibiotic start and stop dates in addition to other pertinent events   11/9 > Presents s/p Cardiac Arrest   Interim History / Subjective:  As above.   Objective   Blood pressure (!) 143/74, pulse (!) 109, temperature (!) 97.1 F (36.2 C), temperature source Temporal, resp. rate (!) 24, height 5\' 2"  (1.575 m), SpO2 100 %.    Vent Mode: PRVC FiO2 (%):  [40 %-100 %] 40 % Set Rate:  [18 bmp-24 bmp] 24 bmp Vt Set:  [400 mL] 400 mL PEEP:  [5 cmH20] 5 cmH20 Plateau Pressure:  [14  cmH20] 14 cmH20  No intake or output data in the 24 hours ending 07/04/2021 1218 There were no vitals filed for this visit.  Examination: General: acutely ill appearing adult female on vent  HENT: ETT/OG in place  Lungs: Clear breath sounds  Cardiovascular: RRR, no mRG Abdomen: Soft, non-distended, active bowel sounds  Extremities: -edema  Neuro: unresponsive, no motor movement, no cough/gag, pupils intact, 2 mm bilaterally and reactive  GU: foley in place   Resolved Hospital Problem list     Assessment & Plan:   PEA Cardiac Arrest with unclear etiology.  -Bedside ECHO with no RV strain Plan  -Normothermia Protocol  -Formal ECHO pending  -Maintain MAP >65 (currently with no vasopressor support requirements) -Trend Troponin   Respiratory Insufficieny in setting of Cardiac Arrest Plan -Vent Support  -VAP prevention Bundle  -Retract ETT 3 cm -RVP pending as below  Encephalopathy, concern for anoxic injury  H/O Dementia  Plan -Hold Home Aricept, Risperidone, Trazodone at this time  -EEG pending -Plan MRI in 72 hours if no improvement    Lactic Acidosis, AKI, suspect secondary to hypoperfusion with Cardiac Arrest  Plan -Trend Lactic Acid  -Trend BMP -Replace electrolytes as indicated   Elevated LFT suspect secondary to hypoperfusion  Plan -Trend LFT   Leucocytosis, question reactivity vs active infection  Plan -PAN Culture  -Cefepime/Vancomycin for now  -Trend WBC and Fever Curve -RVP pending    Hypothyroidism  Plan -TSH pending  Best Practice (right click and "Reselect all SmartList Selections" daily)   Diet/type: NPO DVT prophylaxis: prophylactic heparin  GI prophylaxis: PPI Lines: N/A Foley:  Yes, and it is still needed Code Status:  full code Last date of multidisciplinary goals of care discussion [11/9]  Labs   CBC: Recent Labs  Lab Jul 04, 2021 1023 07-04-21 1034 07/04/2021 1035 July 04, 2021 1145  WBC 20.9*  --   --   --   NEUTROABS 16.5*  --    --   --   HGB 13.2 12.2 12.2 10.5*  HCT 38.8 36.0 36.0 31.0*  MCV 88.6  --   --   --   PLT 183  --   --   --     Basic Metabolic Panel: Recent Labs  Lab 07-04-21 1023 07/04/21 1034 July 04, 2021 1035 07/04/21 1145  NA 136 136 137 142  K 3.8 4.4 4.5 3.1*  CL 103  --  105  --   CO2 20*  --   --   --   GLUCOSE 220*  --  223*  --   BUN 21  --  29*  --   CREATININE 1.24*  --  1.10*  --   CALCIUM 8.8*  --   --   --    GFR: Estimated Creatinine Clearance: 43.7 mL/min (A) (by C-G formula based on SCr of 1.1 mg/dL (H)). Recent Labs  Lab 07-04-2021 1023  WBC 20.9*  LATICACIDVEN 3.9*    Liver Function Tests: Recent Labs  Lab 2021-07-04 1023  AST 108*  ALT 77*  ALKPHOS 90  BILITOT 1.7*  PROT 6.3*  ALBUMIN 3.4*   No results for input(s): LIPASE, AMYLASE in the last 168 hours. No results for input(s): AMMONIA in the last 168 hours.  ABG    Component Value Date/Time   PHART 7.249 (L) 07-04-21 1145   PCO2ART 56.4 (H) 07-04-21 1145   PO2ART 464 (H) 04-Jul-2021 1145   HCO3 25.0 07/04/21 1145   TCO2 27 07/04/2021 1145   ACIDBASEDEF 3.0 (H) 07/04/21 1145   O2SAT 100.0 07-04-21 1145     Coagulation Profile: No results for input(s): INR, PROTIME in the last 168 hours.  Cardiac Enzymes: No results for input(s): CKTOTAL, CKMB, CKMBINDEX, TROPONINI in the last 168 hours.  HbA1C: No results found for: HGBA1C  CBG: No results for input(s): GLUCAP in the last 168 hours.  Review of Systems:   Unable to review as patient is unresponsive and intubated   Past Medical History:  She,  has a past medical history of Dementia (HCC), Hyperlipidemia, Incontinence, and Thyroid disease.   Surgical History:   Past Surgical History:  Procedure Laterality Date   ABDOMINAL HYSTERECTOMY     DUPUYTREN CONTRACTURE RELEASE Left      Social History:   reports that she has quit smoking. She has been exposed to tobacco smoke. She has never used smokeless tobacco. She reports that she  does not drink alcohol and does not use drugs.   Family History:  Her family history includes Cancer in her father; Dementia in her brother and mother; Parkinson's disease in her brother.   Allergies No Known Allergies   Home Medications  Prior to Admission medications   Medication Sig Start Date End Date Taking? Authorizing Provider  cholecalciferol (VITAMIN D3) 25 MCG (1000 UNIT) tablet Take 2,000 Units by mouth daily.    [provider]  donepezil (ARICEPT) 5 MG tablet TAKE 1 TABLET(5 MG) BY MOUTH AT BEDTIME 06/04/21   Koberlein,  [name], MD  hydrOXYzine (ATARAX/VISTARIL) 25 MG tablet Take 1 tablet (25 mg total) by mouth 3 (three) times daily as needed. 04/03/21   Sater, Pearletha Furl, MD  levothyroxine (SYNTHROID) 100 MCG tablet Take 1 tablet (100 mcg total) by mouth daily. 03/28/21   Koberlein, [name], MD  LORazepam (ATIVAN) 0.5 MG tablet Take 1 tablet (0.5 mg total) by mouth 2 (two) times daily as needed for anxiety. 06/04/21   Wynn Banker, MD  meloxicam (MOBIC) 7.5 MG tablet Take 1 tablet (7.5 mg total) by mouth daily as needed for pain. 05/18/21   Koberlein, [name], MD  memantine (NAMENDA) 10 MG tablet TAKE 1 TABLET(10 MG) BY MOUTH TWICE DAILY 05/10/21   Koberlein, Jannette Spanner C, MD  oxybutynin (DITROPAN-XL) 10 MG 24 hr tablet Take 1 tablet (10 mg total) by mouth daily. 12/25/20   Koberlein, [name], MD  potassium chloride (KLOR-CON) 10 MEQ tablet TAKE 2 TABLETS BY MOUTH TWICE DAILY FOR 2 DAYS THEN TAKE 1 TABLET BY MOUTH EVERY DAY THEREAFTER 06/06/21   Koberlein, [name], MD  risperiDONE (RISPERDAL) 1 MG tablet Take 1 tablet (1 mg total) by mouth 2 (two) times daily. 03/26/21   Koberlein, [name], MD  simvastatin (ZOCOR) 40 MG tablet TAKE 1 TABLET(40 MG) BY MOUTH DAILY 06/06/21   Koberlein, Jannette Spanner C, MD  sulfamethoxazole-trimethoprim (BACTRIM DS) 800-160 MG tablet Take 1 tablet by mouth 2 (two) times daily. 03/26/21   Wynn Banker, MD  traZODone (DESYREL) 50 MG tablet Take  1 tablet (50 mg total) by mouth at bedtime as needed for sleep. 03/26/21   Wynn Banker, MD  triamcinolone ointment (KENALOG) 0.1 % SMARTSIG:Sparingly Topical Twice Daily 01/18/21   [provider]  trospium (SANCTURA) 20 MG tablet Take 1 tablet (20 mg total) by mouth 2 (two) times daily. 01/09/21   Sater, Pearletha Furl, MD     Critical care time: 52 minutes     Jovita Kussmaul, AGACNP-BC [facility]  PCCM Pgr: [phone]

## 2021-06-27 NOTE — Progress Notes (Signed)
RT assisted with transportation of this pt from ED RESUS to 66M12 while on full ventilatory support. Pt tolerated well with SVS and no complications. RT gave report to 66M RT. RN currently at bedside.

## 2021-06-27 NOTE — Progress Notes (Signed)
[name] [id number] Admission Data: 07-25-2021 4:50 PM Attending Provider: Cheri Fowler, MD  OXB:[id number], [name], MD Consults/ Treatment Team:   Donna Patrick is a 70 y.o. female patient admitted from ED awake, alert  & orientated  X 3,  Full Code, VSS - Blood pressure (!) 141/88, pulse 82, temperature 97.7 F (36.5 C), resp. rate 16, height 5\' 2"  (1.575 m), SpO2 100 %., O2 Vented    Tele # placed and pt is currently running:normal sinus rhythm.   IV site WDL:  forearm right, condition patent and no redness and left, condition patent and no redness with a transparent dsg that's clean dry and intact.  Pt admitted to 89M room 12, intubated, skin assessment completed.   Will cont to monitor and assist as needed.  [name] [id number], RN 2021/07/25 4:50 PM

## 2021-06-28 ENCOUNTER — Inpatient Hospital Stay (HOSPITAL_COMMUNITY): Payer: Medicare Other

## 2021-06-28 DIAGNOSIS — N179 Acute kidney failure, unspecified: Secondary | ICD-10-CM | POA: Diagnosis not present

## 2021-06-28 DIAGNOSIS — I469 Cardiac arrest, cause unspecified: Secondary | ICD-10-CM | POA: Diagnosis not present

## 2021-06-28 DIAGNOSIS — E440 Moderate protein-calorie malnutrition: Secondary | ICD-10-CM | POA: Insufficient documentation

## 2021-06-28 LAB — COMPREHENSIVE METABOLIC PANEL
ALT: 64 U/L — ABNORMAL HIGH (ref 0–44)
AST: 57 U/L — ABNORMAL HIGH (ref 15–41)
Albumin: 3 g/dL — ABNORMAL LOW (ref 3.5–5.0)
Alkaline Phosphatase: 85 U/L (ref 38–126)
Anion gap: 10 (ref 5–15)
BUN: 18 mg/dL (ref 8–23)
CO2: 23 mmol/L (ref 22–32)
Calcium: 8.3 mg/dL — ABNORMAL LOW (ref 8.9–10.3)
Chloride: 104 mmol/L (ref 98–111)
Creatinine, Ser: 0.9 mg/dL (ref 0.44–1.00)
GFR, Estimated: >60 mL/min (ref >60)
Glucose, Bld: 118 mg/dL — ABNORMAL HIGH (ref 70–99)
Potassium: 5 mmol/L (ref 3.5–5.1)
Sodium: 137 mmol/L (ref 135–145)
Total Bilirubin: 0.7 mg/dL (ref 0.3–1.2)
Total Protein: 5.9 g/dL — ABNORMAL LOW (ref 6.5–8.1)

## 2021-06-28 LAB — GLUCOSE, CAPILLARY
Glucose-Capillary: 118 mg/dL — ABNORMAL HIGH (ref 70–99)
Glucose-Capillary: 114 mg/dL — ABNORMAL HIGH (ref 70–99)
Glucose-Capillary: 103 mg/dL — ABNORMAL HIGH (ref 70–99)
Glucose-Capillary: 105 mg/dL — ABNORMAL HIGH (ref 70–99)
Glucose-Capillary: 98 mg/dL (ref 70–99)
Glucose-Capillary: 91 mg/dL (ref 70–99)

## 2021-06-28 LAB — CBC
HCT: 40 % (ref 36.0–46.0)
Hemoglobin: 13.2 g/dL (ref 12.0–15.0)
MCH: 29.5 pg (ref 26.0–34.0)
MCHC: 33 g/dL (ref 30.0–36.0)
MCV: 89.5 fL (ref 80.0–100.0)
Platelets: 147 10*3/uL — ABNORMAL LOW (ref 150–400)
RBC: 4.47 MIL/uL (ref 3.87–5.11)
RDW: 13 % (ref 11.5–15.5)
WBC: 10.8 10*3/uL — ABNORMAL HIGH (ref 4.0–10.5)
nRBC: 0 % (ref 0.0–0.2)

## 2021-06-28 LAB — MRSA NEXT GEN BY PCR, NASAL: MRSA by PCR Next Gen: DETECTED — AB

## 2021-06-28 LAB — PHOSPHORUS: Phosphorus: 2.9 mg/dL (ref 2.5–4.6)

## 2021-06-28 LAB — T4, FREE: Free T4: 1.34 ng/dL — ABNORMAL HIGH (ref 0.61–1.12)

## 2021-06-28 LAB — HIV ANTIBODY (ROUTINE TESTING W REFLEX): HIV Screen 4th Generation wRfx: NONREACTIVE

## 2021-06-28 LAB — MAGNESIUM: Magnesium: 2.5 mg/dL — ABNORMAL HIGH (ref 1.7–2.4)

## 2021-06-28 LAB — TRIGLYCERIDES: Triglycerides: 108 mg/dL (ref <150)

## 2021-06-28 LAB — LACTIC ACID, PLASMA: Lactic Acid, Venous: 3.3 mmol/L (ref 0.5–1.9)

## 2021-06-28 MED ORDER — CHLORHEXIDINE GLUCONATE CLOTH 2 % EX PADS
6.0000 | MEDICATED_PAD | Freq: Every day | CUTANEOUS | Status: DC
  Administered 2021-06-29 – 2021-07-04 (×7): 6 via TOPICAL

## 2021-06-28 MED ORDER — ORAL CARE MOUTH RINSE
15.0000 mL | OROMUCOSAL | Status: DC
  Administered 2021-06-28 – 2021-06-30 (×19): 15 mL via OROMUCOSAL

## 2021-06-28 MED ORDER — VANCOMYCIN HCL 1000 MG/200ML IV SOLN
1000.0000 mg | INTRAVENOUS | Status: AC
  Administered 2021-06-28 – 2021-07-01 (×4): 1000 mg via INTRAVENOUS
  Filled 2021-06-28 (×4): qty 200

## 2021-06-28 MED ORDER — MUPIROCIN 2 % EX OINT
1.0000 "application " | TOPICAL_OINTMENT | Freq: Two times a day (BID) | CUTANEOUS | Status: AC
  Administered 2021-06-28 – 2021-07-03 (×10): 1 via NASAL
  Filled 2021-06-28: qty 22

## 2021-06-28 MED ORDER — VITAL AF 1.2 CAL PO LIQD
1000.0000 mL | ORAL | Status: DC
  Administered 2021-06-28 – 2021-06-29 (×2): 1000 mL
  Filled 2021-06-28: qty 1000

## 2021-06-28 MED ORDER — CHLORHEXIDINE GLUCONATE 0.12% ORAL RINSE (MEDLINE KIT)
15.0000 mL | Freq: Two times a day (BID) | OROMUCOSAL | Status: DC
  Administered 2021-06-28 – 2021-06-30 (×5): 15 mL via OROMUCOSAL

## 2021-06-28 MED ORDER — LEVOTHYROXINE SODIUM 100 MCG PO TABS
100.0000 ug | ORAL_TABLET | Freq: Every day | ORAL | Status: DC
  Administered 2021-06-28 – 2021-06-30 (×3): 100 ug
  Filled 2021-06-28 (×3): qty 1

## 2021-06-28 MED ORDER — PROSOURCE TF PO LIQD: 45.0000 mL | Freq: Two times a day (BID) | ORAL | Status: DC

## 2021-06-28 NOTE — Progress Notes (Signed)
NAME:  [name], MRN:  [id number], DOB:  10/06/1950, LOS: 1 ADMISSION DATE:  07/14/2021, CONSULTATION DATE:  06/22/2021 REFERRING MD:  Rubin Payor, CHIEF COMPLAINT:   Cardiac arrest   History of Present Illness:  Donna Patrick is a 71 y.o. F who presented after witnessed cardiac arrest at home by her son. She was going to the restroom when he heard her collapse and when he went to investigate he says she was gasping in pain and collapsed. At that point he called EMS and began CPR. EMS arrived and patient was found to be in wide PEA. CPR was given for 15 minutes and she received 1 round of epi. She did ultimately have ROSC. On arrival to ED she had spontaneous breathing but minimal neurologic functioning and was intubated. CT was negative for acute process.   Pertinent  Medical History  Alzheimer's, hypothyroidism, HLD  Significant Hospital Events: Including procedures, antibiotic start and stop dates in addition to other pertinent events   11/09 Cardiac arrest 11/09 Intubated 11/09 Cefepime 2g started 11/09 EEG concerning severe diffuse encephalopathy  Interim History / Subjective:  Patient remains intubated and sedated. She does not follow commands or open to eyes to voice. Her son and daughter in law are in the room and describe her as feisty and sharp at baseline. They say that over the last week or so she has been having full, clear conversations with a woman in child in the room that only she can see as well as telling the family she is 'going home' soon and that the family 'will be okay.'   Objective   Blood pressure (!) 87/49, pulse 63, temperature 99 F (37.2 C), resp. rate (!) 24, height 5\' 2"  (1.575 m), weight 67.6 kg, SpO2 98 %.    Vent Mode: PRVC FiO2 (%):  [30 %-100 %] 30 % Set Rate:  [18 bmp-24 bmp] 24 bmp Vt Set:  [400 mL] 400 mL PEEP:  [5 cmH20] 5 cmH20 Plateau Pressure:  [11 cmH20-18 cmH20] 18 cmH20   Intake/Output Summary (Last 24 hours) at 06/28/2021  1008 Last data filed at 06/28/2021 0900 Gross per 24 hour  Intake 1061.09 ml  Output 1820 ml  Net -758.91 ml   Filed Weights   06/28/21 0500  Weight: 67.6 kg    Examination: Constitutional: Intubated and sedated elderly female. No acute distress noted. Cardio: Regular rate and rhythm. No murmurs, rubs, gallops. Pulm: Clear to auscultation bilaterally.  Abdomen: Soft, non-distended. MSK: Bilateral LE 1+ pitting edema. Skin: Skin is cool and dry.  Neuro: Sedated. Does not open eyes to voice or follow commands.  Resolved Hospital Problem list   AKI Hypokalemia  Assessment & Plan:  PEA cardiac arrest, unclear etiology Lactic acidosis Patients family reports no cardiac history and that she was at baseline up to this event. Serial troponins are now downtrending (11/09 2232 126>11/09 1636 146>11/09 1023 37). Lactic acid downtrending as well (11/10 0451 3.3> 11/09 2232 4.3> 11/09 3.6> 11/09 1023 3.9). - Echocardiogram 11/09: `1. Left ventricular ejection fraction, by estimation, is 55 to 60%. The left ventricle has normal function. The left ventricle has no regional wall motion abnormalities. Left ventricular diastolic parameters are consistent with Grade I diastolic dysfunction (impaired relaxation).   2. Right ventricular systolic function is normal. The right ventricular size is normal. There is moderately elevated pulmonary artery systolic pressure. The estimated right ventricular systolic pressure is 52.5 mmHg.   3. The mitral valve is normal in structure. No  evidence of mitral valve regurgitation. No evidence of mitral stenosis.   4. The aortic valve is tricuspid. Aortic valve regurgitation is not visualized. No aortic stenosis is present.   5. The inferior vena cava is dilated in size with <50% respiratory variability, suggesting right atrial pressure of 15 mmHg.   6. A small pericardial effusion is present.  - LE US showed no evidence of DVT bilaterally - Trend CMP. Replete  electrolytes as needed. - MAP goal >65  Acute hypoxic, hypercapnic respiratory failure Concern for aspiration pneumonia - Continue ventilator support. We will discontinue propofol so that a better assessment of encephalopathy can be assessed. - Continue cefepime 2g IV BID - Continue vancomycin 1250 mg IV daily  Acute encephalopathy EEG 11/09 was suggestive of severe diffuse encephalopathy with unknown etiology though this could be related to sedation or anoxic/hypoxic brain injury from cardiac arrest. - MRI 11/10 - Continuous EEG - Keppra 500 mg IV BID for seizure prophylaxis - Versed 2 mg IV q2h PRN seizures - Discontinue propofol - Holding home Aricept, risperidone, trazodone   Shock liver LFTs improving.  - Trend CMP  AKI, resolved Cr improved to 0.90.  - Trend CMP - I's/O's  Hypothyroidism TSH markedly elevated at 25.148. She is reportedly on levothyroxine 100 mcg daily at home. - T4 ordered - Consider restarting home dose of levothyroxine  Best Practice (right click and "Reselect all SmartList Selections" daily)   Diet/type: NPO DVT prophylaxis: prophylactic heparin  GI prophylaxis: PPI Lines: N/A Foley:  Yes, and it is still needed Code Status:  full code Last date of multidisciplinary goals of care discussion [ ]   Labs   CBC: Recent Labs  Lab 06/21/2021 1023 06/19/2021 1034 06/22/2021 1035 06/26/2021 1145 07/07/2021 1512 06/28/21 0452  WBC 20.9*  --   --   --   --  10.8*  NEUTROABS 16.5*  --   --   --   --   --   HGB 13.2 12.2 12.2 10.5* 10.9* 13.2  HCT 38.8 36.0 36.0 31.0* 32.0* 40.0  MCV 88.6  --   --   --   --  89.5  PLT 183  --   --   --   --  147*    Basic Metabolic Panel: Recent Labs  Lab 07/12/2021 1023 06/29/2021 1034 07/10/2021 1035 07/13/2021 1145 06/19/2021 1512 06/28/21 0452  NA 136 136 137 142 141 137  K 3.8 4.4 4.5 3.1* 3.7 5.0  CL 103  --  105  --   --  104  CO2 20*  --   --   --   --  23  GLUCOSE 220*  --  223*  --   --  118*  BUN 21  --   29*  --   --  18  CREATININE 1.24*  --  1.10*  --   --  0.90  CALCIUM 8.8*  --   --   --   --  8.3*  MG  --   --   --   --   --  2.5*  PHOS  --   --   --   --   --  2.9   GFR: Estimated Creatinine Clearance: 53.9 mL/min (by C-G formula based on SCr of 0.9 mg/dL). Recent Labs  Lab 07/10/2021 1023 07/01/2021 1636 07/07/2021 2232 06/28/21 0451 06/28/21 0452  WBC 20.9*  --   --   --  10.8*  LATICACIDVEN 3.9* 3.6* 4.3* 3.3*  --  Liver Function Tests: Recent Labs  Lab 07/14/2021 1023 06/28/21 0452  AST 108* 57*  ALT 77* 64*  ALKPHOS 90 85  BILITOT 1.7* 0.7  PROT 6.3* 5.9*  ALBUMIN 3.4* 3.0*   No results for input(s): LIPASE, AMYLASE in the last 168 hours. No results for input(s): AMMONIA in the last 168 hours.  ABG    Component Value Date/Time   PHART 7.324 (L) 06/19/2021 1512   PCO2ART 43.1 07/08/2021 1512   PO2ART 94 06/28/2021 1512   HCO3 22.6 07/06/2021 1512   TCO2 24 07/05/2021 1512   ACIDBASEDEF 4.0 (H) 06/22/2021 1512   O2SAT 97.0 07/16/2021 1512     Coagulation Profile: No results for input(s): INR, PROTIME in the last 168 hours.  Cardiac Enzymes: No results for input(s): CKTOTAL, CKMB, CKMBINDEX, TROPONINI in the last 168 hours.  HbA1C: Hgb A1c MFr Bld  Date/Time Value Ref Range Status  07/08/2021 10:23 AM 5.2 4.8 - 5.6 % Final    Comment:    (NOTE) Pre diabetes:          5.7%-6.4%  Diabetes:              >6.4%  Glycemic control for   <7.0% adults with diabetes     CBG: Recent Labs  Lab 06/29/2021 1548 07/12/2021 1924 06/29/2021 2354 06/28/21 0430 06/28/21 0743  GLUCAP 144* 118* 103* 118* 114*    Review of Systems:   Review of Systems  Unable to perform ROS: Intubated    Past Medical History:  She,  has a past medical history of Dementia (HCC), Hyperlipidemia, Incontinence, and Thyroid disease.   Surgical History:   Past Surgical History:  Procedure Laterality Date   ABDOMINAL HYSTERECTOMY     DUPUYTREN CONTRACTURE RELEASE Left       Social History:   reports that she has quit smoking. She has been exposed to tobacco smoke. She has never used smokeless tobacco. She reports that she does not drink alcohol and does not use drugs.   Family History:  Her family history includes Cancer in her father; Dementia in her brother and mother; Parkinson's disease in her brother.   Allergies No Known Allergies   Home Medications  Prior to Admission medications   Medication Sig Start Date End Date Taking? Authorizing Provider  cholecalciferol (VITAMIN D3) 25 MCG (1000 UNIT) tablet Take 2,000 Units by mouth daily.    [provider]  donepezil (ARICEPT) 5 MG tablet TAKE 1 TABLET(5 MG) BY MOUTH AT BEDTIME 06/04/21   Koberlein, [name], MD  hydrOXYzine (ATARAX/VISTARIL) 25 MG tablet Take 1 tablet (25 mg total) by mouth 3 (three) times daily as needed. 04/03/21   Sater, Pearletha Furl, MD  levothyroxine (SYNTHROID) 100 MCG tablet Take 1 tablet (100 mcg total) by mouth daily. 03/28/21   Koberlein, [name], MD  LORazepam (ATIVAN) 0.5 MG tablet Take 1 tablet (0.5 mg total) by mouth 2 (two) times daily as needed for anxiety. 06/04/21   Wynn Banker, MD  meloxicam (MOBIC) 7.5 MG tablet Take 1 tablet (7.5 mg total) by mouth daily as needed for pain. 05/18/21   Koberlein, [name], MD  memantine (NAMENDA) 10 MG tablet TAKE 1 TABLET(10 MG) BY MOUTH TWICE DAILY 05/10/21   Koberlein, Jannette Spanner C, MD  oxybutynin (DITROPAN-XL) 10 MG 24 hr tablet Take 1 tablet (10 mg total) by mouth daily. 12/25/20   Koberlein, [name], MD  potassium chloride (KLOR-CON) 10 MEQ tablet TAKE 2 TABLETS BY MOUTH TWICE DAILY FOR 2  DAYS THEN TAKE 1 TABLET BY MOUTH EVERY DAY THEREAFTER 06/06/21   Koberlein, Andris Flurry C, MD  risperiDONE (RISPERDAL) 1 MG tablet Take 1 tablet (1 mg total) by mouth 2 (two) times daily. 03/26/21   Koberlein, Steele Berg, MD  simvastatin (ZOCOR) 40 MG tablet TAKE 1 TABLET(40 MG) BY MOUTH DAILY 06/06/21   Koberlein, Andris Flurry C, MD   sulfamethoxazole-trimethoprim (BACTRIM DS) 800-160 MG tablet Take 1 tablet by mouth 2 (two) times daily. 03/26/21   Caren Macadam, MD  traZODone (DESYREL) 50 MG tablet Take 1 tablet (50 mg total) by mouth at bedtime as needed for sleep. 03/26/21   Caren Macadam, MD  triamcinolone ointment (KENALOG) 0.1 % SMARTSIG:Sparingly Topical Twice Daily 01/18/21   [provider]  trospium (SANCTURA) 20 MG tablet Take 1 tablet (20 mg total) by mouth 2 (two) times daily. 01/09/21   Sater, Nanine Means, MD     Critical care time: 50 minutes

## 2021-06-28 NOTE — Progress Notes (Signed)
Initial Nutrition Assessment  DOCUMENTATION CODES:   Non-severe (moderate) malnutrition in context of chronic illness  INTERVENTION:   Initiate tube feeding via OG tube: Vital AF 1.2 at 55 ml/h (1320 ml per day)  Provides 1584 kcal, 99 gm protein, 1071 ml free water daily  NUTRITION DIAGNOSIS:   Moderate Malnutrition related to chronic illness (Alzheimer's dementia) as evidenced by mild fat depletion, mild muscle depletion.  GOAL:   Patient will meet greater than or equal to 90% of their needs  MONITOR:   Vent status, TF tolerance, Labs  REASON FOR ASSESSMENT:   Ventilator, Consult Enteral/tube feeding initiation and management  ASSESSMENT:   70 yo female admitted S/P cardiac arrest. PMH includes Alzheimer's dementia, hypothyroidism, HLD.  Discussed patient in ICU rounds and with RN today. Propofol off. OG tube in place, okay to begin TF. Plans for MRI of the brain tomorrow.  Patient is currently intubated on ventilator support MV: 10.2 L/min Temp (24hrs), Avg:98.6 F (37 C), Min:96.5 F (35.8 C), Max:99.3 F (37.4 C)   Labs reviewed. Mag 2.5 (H) CBG: [id number]  Medications reviewed and include Colace, Novolog, Protonix, Miralax, Keppra, Levophed.   Weight history reviewed. No significant weight changes noted over the past 6 months.   Per discussion with patient's family at bedside, she usually eats well, but has recently been eating different foods. There are several foods that she does not eat anymore, such as chocolate pudding. Over the past few days, she has been taking apart her sandwiches at lunch, but not eating them. Family thinks change in eating habits is d/t depression r/t the recent passing of her daughter.   NUTRITION - FOCUSED PHYSICAL EXAM:  Flowsheet Row Most Recent Value  Orbital Region Mild depletion  Upper Arm Region Mild depletion  Thoracic and Lumbar Region Mild depletion  Buccal Region Unable to assess  Temple Region Mild  depletion  Clavicle Bone Region Mild depletion  Clavicle and Acromion Bone Region Mild depletion  Scapular Bone Region Unable to assess  Dorsal Hand Moderate depletion  Patellar Region Mild depletion  Anterior Thigh Region Mild depletion  Posterior Calf Region Mild depletion  Edema (RD Assessment) Moderate  Hair Reviewed  Eyes Unable to assess  Mouth Unable to assess  Skin Reviewed  Nails Reviewed       Diet Order:   Diet Order     None       EDUCATION NEEDS:   No education needs have been identified at this time  Skin:  Skin Assessment: Skin Integrity Issues: Skin Integrity Issues:: Stage I, Other (Comment) Stage I: coccyx Other: skin tear to R arm  Last BM:  no BM documented  Height:   Ht Readings from Last 1 Encounters:  07/05/2021 5\' 2"  (1.575 m)    Weight:   Wt Readings from Last 1 Encounters:  06/28/21 67.6 kg    BMI:  Body mass index is 27.26 kg/m.  Estimated Nutritional Needs:   Kcal:  13/10/22  Protein:  95-110 gm  Fluid:  >/= 1.5 L    [id number], RD, LDN, CNSC Please refer to Amion for contact information.

## 2021-06-28 NOTE — Progress Notes (Signed)
eLink Physician-Brief Progress Note Patient Name: Donna Patrick DOB: 1951-06-14 MRN: [id number]   Date of Service  06/28/2021  HPI/Events of Note  Patient at risk of self-extubation.  eICU Interventions  Bilateral wrist restraints ordered.        Thomasene Lot Nealy Hickmon 06/28/2021, 7:27 PM

## 2021-06-28 NOTE — Progress Notes (Signed)
eLink Physician-Brief Progress Note Patient Name: DAJANA GEHRIG DOB: 1951/05/20 MRN: [id number]   Date of Service  06/28/2021  HPI/Events of Note  Slight increase in serum lactic acid from 3.6 to 4.3 s/p cardiac arrest with CPR and ROSC, echo had normal bi-ventricular function without RWMA.  eICU Interventions  Will repeat the lactic acid in 4 hours.        Barbee Mamula U Gussie Towson 06/28/2021, 1:16 AM

## 2021-06-28 NOTE — Progress Notes (Signed)
Patient's current temp is 37.5 C, per orders if patient reached 37.6 C we are to apply artic sun and temperature management post arrest cooling protocol. Per DO resident, instead of applying artic sun this RN is to place cooling blanket and maintain normothermia. Will continue to monitor if patient reaches that point.

## 2021-06-28 NOTE — Progress Notes (Signed)
EEG maintenance performed.  No skin  breakdown observed at electrode sites Fp2, F7.

## 2021-06-28 NOTE — Progress Notes (Signed)
LTM maint complete - no skin breakdown under: Fp1 Fp2 F4 [facility] monitored, Event button test confirmed by [facility].  

## 2021-06-28 NOTE — Procedures (Addendum)
Patient Name: Donna Patrick  MRN: [id number]  Epilepsy Attending: Charlsie Quest  Referring Physician/Provider: Tobey Grim, NP Duration: 06/22/2021 1803 to 06/28/2021 1803   Patient history: 70 year old female who presented to the emergency department status post PEA cardiac arrest after she became unresponsive in the bathroom, witnessed by nephew, who started CPR, ROSC was achieved after 15 minutes, she received 1 amp of epi. EEG to evaluate for seizure   Level of alertness:comatose   AEDs during EEG study: LEV   Technical aspects: This EEG study was done with scalp electrodes positioned according to the 10-20 International system of electrode placement. Electrical activity was acquired at a sampling rate of 500Hz  and reviewed with a high frequency filter of 70Hz  and a low frequency filter of 1Hz . EEG data were recorded continuously and digitally stored.    Description: EEG showed near continuous generalized 1-3 Hz delta slowing which at times appears sharply contoured and rhythmic. Intermittent 5-9 Hz theta-alpha activity. Hyperventilation and photic stimulation were not performed.      ABNORMALITY - Continuous slow, generalized   IMPRESSION: This study is suggestive of moderate to severe diffuse encephalopathy, nonspecific etiology but could be secondary to sedation, anoxic/hypoxic brain injury. No seizures or definite epileptiform discharges were seen throughout the recording.   Donna Patrick 

## 2021-06-29 ENCOUNTER — Inpatient Hospital Stay (HOSPITAL_COMMUNITY): Payer: Medicare Other

## 2021-06-29 DIAGNOSIS — I469 Cardiac arrest, cause unspecified: Secondary | ICD-10-CM | POA: Diagnosis not present

## 2021-06-29 DIAGNOSIS — J9601 Acute respiratory failure with hypoxia: Secondary | ICD-10-CM | POA: Diagnosis not present

## 2021-06-29 LAB — MAGNESIUM: Magnesium: 2.1 mg/dL (ref 1.7–2.4)

## 2021-06-29 LAB — GLUCOSE, CAPILLARY
Glucose-Capillary: 94 mg/dL (ref 70–99)
Glucose-Capillary: 107 mg/dL — ABNORMAL HIGH (ref 70–99)
Glucose-Capillary: 134 mg/dL — ABNORMAL HIGH (ref 70–99)
Glucose-Capillary: 102 mg/dL — ABNORMAL HIGH (ref 70–99)
Glucose-Capillary: 107 mg/dL — ABNORMAL HIGH (ref 70–99)
Glucose-Capillary: 111 mg/dL — ABNORMAL HIGH (ref 70–99)

## 2021-06-29 LAB — BASIC METABOLIC PANEL
Anion gap: 8 (ref 5–15)
BUN: 13 mg/dL (ref 8–23)
CO2: 24 mmol/L (ref 22–32)
Calcium: 8 mg/dL — ABNORMAL LOW (ref 8.9–10.3)
Chloride: 105 mmol/L (ref 98–111)
Creatinine, Ser: 0.7 mg/dL (ref 0.44–1.00)
GFR, Estimated: >60 mL/min (ref >60)
Glucose, Bld: 117 mg/dL — ABNORMAL HIGH (ref 70–99)
Potassium: 3.6 mmol/L (ref 3.5–5.1)
Sodium: 137 mmol/L (ref 135–145)

## 2021-06-29 LAB — PHOSPHORUS: Phosphorus: 2.2 mg/dL — ABNORMAL LOW (ref 2.5–4.6)

## 2021-06-29 LAB — CBC
HCT: 34.5 % — ABNORMAL LOW (ref 36.0–46.0)
Hemoglobin: 11.8 g/dL — ABNORMAL LOW (ref 12.0–15.0)
MCH: 30.5 pg (ref 26.0–34.0)
MCHC: 34.2 g/dL (ref 30.0–36.0)
MCV: 89.1 fL (ref 80.0–100.0)
Platelets: DECREASED 10*3/uL (ref 150–400)
RBC: 3.87 MIL/uL (ref 3.87–5.11)
RDW: 13.1 % (ref 11.5–15.5)
WBC: 7.7 10*3/uL (ref 4.0–10.5)
nRBC: 0 % (ref 0.0–0.2)

## 2021-06-29 LAB — LACTIC ACID, PLASMA: Lactic Acid, Venous: 1 mmol/L (ref 0.5–1.9)

## 2021-06-29 MED ORDER — POTASSIUM PHOSPHATES 15 MMOLE/5ML IV SOLN
15.0000 mmol | Freq: Once | INTRAVENOUS | Status: AC
  Administered 2021-06-29: 15 mmol via INTRAVENOUS
  Filled 2021-06-29: qty 5

## 2021-06-29 MED ORDER — LABETALOL HCL 5 MG/ML IV SOLN
10.0000 mg | INTRAVENOUS | Status: AC | PRN
  Administered 2021-06-29 – 2021-06-30 (×3): 10 mg via INTRAVENOUS
  Filled 2021-06-29 (×3): qty 4

## 2021-06-29 MED ORDER — HYDROMORPHONE HCL 2 MG PO TABS
1.0000 mg | ORAL_TABLET | ORAL | Status: DC | PRN
  Administered 2021-06-29: 1 mg via ORAL
  Filled 2021-06-29: qty 1

## 2021-06-29 NOTE — Progress Notes (Signed)
Phos 2.2, K+ 3.6 Replaced per protocol

## 2021-06-29 NOTE — TOC Progression Note (Signed)
Transition of Care [facility]) - Initial/Assessment Note    Patient Details  Name: Donna Patrick MRN: [id number] Date of Birth: [birth year]  [facility]) CM/SW Contact:    Ralene Bathe, LCSWA Phone Number: 06/29/2021, 4:32 PM  Clinical Narrative:                  CSW received consult for POA and contacted Chaplain to assist the family with the process.     Patient Goals and CMS Choice        Expected Discharge Plan and Services                                                Prior Living Arrangements/Services                       Activities of Daily Living      Permission Sought/Granted                  Emotional Assessment              Admission diagnosis:  Cardiac arrest [facility]) [I46.9] AKI (acute kidney injury) (HCC) [N17.9] Patient Active Problem List   Diagnosis Date Noted   Acute hypoxemic respiratory failure (HCC)    Malnutrition of moderate degree 06/28/2021   Cardiac arrest (HCC) 2021-07-26   Pressure injury of skin 07-26-21   Agitation 01/09/2021   Hypothyroid 12/15/2020   B12 deficiency 12/15/2020   Hyperlipidemia 10/25/2020   Overactive bladder 10/25/2020   Dementia with behavioral disturbance 10/25/2020   PCP:  Wynn Banker, MD Pharmacy:   [facility] [phone] [place] Otto, [place] - [address] AT [facility] [address] [facility] [address] [place] [address] Phone: [phone] Fax: [phone]     Social Determinants of Health (SDOH) Interventions    Readmission Risk Interventions No flowsheet data found.

## 2021-06-29 NOTE — Progress Notes (Signed)
RN notified me that vent alarming, low RR.  Per RN, pt just received fentanyl.  Pt placed back on full vent support.

## 2021-06-29 NOTE — Progress Notes (Signed)
eLink Physician-Brief Progress Note Patient Name: Donna Patrick DOB: July 28, 1951 MRN: [id number]   Date of Service  06/29/2021  HPI/Events of Note  Patient with sub-optimal blood pressure control.  eICU Interventions  PRN iv Labetalol ordered for SBP > 160 mmHg.        Thomasene Lot Diamond Jentz 06/29/2021, 9:14 PM

## 2021-06-29 NOTE — Progress Notes (Signed)
Patient's son and daughter-in-law are present for goals of care conversation. At this time they are understand that Donna Patrick is currently FULL CODE, meaning that if she were to go into cardiac arrest she would receive CPR and remain intubated and would like her to [facility] FULL CODE.  They would like to proceed with ventilator weaning as appropriate/indicated. Once Ms. Woodrome has been successfully extubated, THEY WISH FOR HER CODE STATUS TO CHANGE TO DNR/DNI.  These options have been discussed extensively with the family. They are in full understanding of their options at this time.  Carey Bullocks PGY-1

## 2021-06-29 NOTE — Plan of Care (Signed)

## 2021-06-29 NOTE — Progress Notes (Signed)
NAME:  Donna Patrick, MRN:  [id number], DOB:  February 03, 1951, LOS: 2 ADMISSION DATE:  2021-07-27, CONSULTATION DATE:  Jul 27, 2021 REFERRING MD:  Rubin Payor, CHIEF COMPLAINT:  Cardiac arrest   History of Present Illness:  Donna Patrick is a 70 y.o. F who presented after witnessed cardiac arrest at home by her son. She was going to the restroom when he heard her collapse and when he went to investigate he says she was gasping in pain and collapsed. At that point he called EMS and began CPR. EMS arrived and patient was found to be in wide PEA. CPR was given for 15 minutes and she received 1 round of epi. She did ultimately have ROSC. On arrival to ED she had spontaneous breathing but minimal neurologic functioning and was intubated. CT was negative for acute process.   Pertinent  Medical History  Alzheimer's, hypothyroidism, HLD  Significant Hospital Events: Including procedures, antibiotic start and stop dates in addition to other pertinent events   11/09 Cardiac arrest 11/09 Intubated 11/09 Cefepime 2g started 11/09 EEG concerning severe diffuse encephalopathy 11/11 Continuous EEG suggestive of moderate to severe diffuse encephalopathy of nonspecific etiology, however could be 2/2 sedation, anoxic/hypoxic brain injury without seizures or definite epileptiform discharges.  Interim History / Subjective:  Patient remains intubated overnight. She is awake today and follows commands. She does require extra time for response but is appropriate with actions once asked.   Objective   Blood pressure (!) 162/71, pulse 82, temperature (!) 97.3 F (36.3 C), resp. rate (!) 29, height 5\' 2"  (1.575 m), weight 67.8 kg, SpO2 98 %.    Vent Mode: PRVC FiO2 (%):  [30 %] 30 % Set Rate:  [24 bmp] 24 bmp Vt Set:  [400 mL] 400 mL PEEP:  [5 cmH20] 5 cmH20 Pressure Support:  [5 cmH20] 5 cmH20 Plateau Pressure:  [7 cmH20-10 cmH20] 7 cmH20   Intake/Output Summary (Last 24 hours) at 06/29/2021 1124 Last data filed  at 06/29/2021 0800 Gross per 24 hour  Intake 1519.73 ml  Output 805 ml  Net 714.73 ml   Filed Weights   06/28/21 0500 06/29/21 0500  Weight: 67.6 kg 67.8 kg    Examination: Constitutional: Elderly female in no acute distress. Cardio: Regular rate and rhythm. No murmurs, rubs, gallops. Pulm: Clear to auscultation bilaterally. Intubated. Abdomen: Soft, non-distended. MSK: Decreased muscle mass. Bilateral 1+ LE edema. Skin: Skin is warm and dry. Neuro: Opens eyes to voice and tracks, follows commands of squeezing fingers, moving toes. Spontaneously withdraws legs.   Resolved Hospital Problem list   AKI Hypokalemia Lactic acidosis  Assessment & Plan:  S/p PEA cardiac arrest, unclear etiology Patients family reports no cardiac history and that she was at baseline up to this event. Hemodynamically stable at this time.  - Trend CMP. Replete electrolytes as needed. - MAP goal >65 - Normothermia protocol   Acute hypoxic, hypercapnic respiratory failure Concern for aspiration pneumonia - Continue ventilator support. Weaning trial after MRI this afternoon.  - Goals of care discussion with family when they come to visit this afternoon.  - Transition to Dilaudid for pain management - Continue cefepime 2g IV BID and vancomycin 1250 mg IV daily, end date 11/11   Acute encephalopathy EEG 11/09 was suggestive of severe diffuse encephalopathy with unknown etiology though this could be related to sedation or anoxic/hypoxic brain injury from cardiac arrest. Continuous EEG read 11/11 was suggestive of moderate to severe diffuse encephalopathy of nonspecific etiology, however could be 2/2 sedation,  anoxic/hypoxic brain injury without seizures or definite epileptiform discharges. - MRI 11/11 - Keppra 500 mg IV BID for seizure prophylaxis - Versed 2 mg IV q2h PRN seizures - Holding home Aricept, risperidone, trazodone    Hypothyroidism TSH markedly elevated at 25.148. She is reportedly on  levothyroxine 100 mcg daily at home. - T4 slightly elevated at 1.34 - Will restart home dose of levothyroxine 100 mcg daily  Best Practice (right click and "Reselect all SmartList Selections" daily)   Diet/type: TPN DVT prophylaxis: prophylactic heparin  GI prophylaxis: PPI Lines: N/A Foley:  Yes, and it is still needed Code Status:  full code Last date of multidisciplinary goals of care discussion [ ]   Labs   CBC: Recent Labs  Lab 06/25/2021 1023 06/28/2021 1034 06/30/2021 1035 07/13/2021 1145 06/20/2021 1512 06/28/21 0452 06/29/21 0355  WBC 20.9*  --   --   --   --  10.8* 7.7  NEUTROABS 16.5*  --   --   --   --   --   --   HGB 13.2   < > 12.2 10.5* 10.9* 13.2 11.8*  HCT 38.8   < > 36.0 31.0* 32.0* 40.0 34.5*  MCV 88.6  --   --   --   --  89.5 89.1  PLT 183  --   --   --   --  147* PLATELET CLUMPS NOTED ON SMEAR, COUNT APPEARS DECREASED   < > = values in this interval not displayed.    Basic Metabolic Panel: Recent Labs  Lab 06/28/2021 1023 06/28/2021 1034 07/02/2021 1035 06/28/2021 1145 07/14/2021 1512 06/28/21 0452 06/29/21 0355  NA 136   < > 137 142 141 137 137  K 3.8   < > 4.5 3.1* 3.7 5.0 3.6  CL 103  --  105  --   --  104 105  CO2 20*  --   --   --   --  23 24  GLUCOSE 220*  --  223*  --   --  118* 117*  BUN 21  --  29*  --   --  18 13  CREATININE 1.24*  --  1.10*  --   --  0.90 0.70  CALCIUM 8.8*  --   --   --   --  8.3* 8.0*  MG  --   --   --   --   --  2.5* 2.1  PHOS  --   --   --   --   --  2.9 2.2*   < > = values in this interval not displayed.   GFR: Estimated Creatinine Clearance: 60.8 mL/min (by C-G formula based on SCr of 0.7 mg/dL). Recent Labs  Lab 07/01/2021 1023 06/21/2021 1636 06/23/2021 2232 06/28/21 0451 06/28/21 0452 06/29/21 0355  WBC 20.9*  --   --   --  10.8* 7.7  LATICACIDVEN 3.9* 3.6* 4.3* 3.3*  --   --     Liver Function Tests: Recent Labs  Lab 06/19/2021 1023 06/28/21 0452  AST 108* 57*  ALT 77* 64*  ALKPHOS 90 85  BILITOT 1.7* 0.7   PROT 6.3* 5.9*  ALBUMIN 3.4* 3.0*   No results for input(s): LIPASE, AMYLASE in the last 168 hours. No results for input(s): AMMONIA in the last 168 hours.  ABG    Component Value Date/Time   PHART 7.324 (L) 07/10/2021 1512   PCO2ART 43.1 07/08/2021 1512   PO2ART 94 07/02/2021 1512   HCO3 22.6 07/03/2021  1512   TCO2 24 07-16-2021 1512   ACIDBASEDEF 4.0 (H) 07/16/2021 1512   O2SAT 97.0 07/16/2021 1512     Coagulation Profile: No results for input(s): INR, PROTIME in the last 168 hours.  Cardiac Enzymes: No results for input(s): CKTOTAL, CKMB, CKMBINDEX, TROPONINI in the last 168 hours.  HbA1C: Hgb A1c MFr Bld  Date/Time Value Ref Range Status  July 16, 2021 10:23 AM 5.2 4.8 - 5.6 % Final    Comment:    (NOTE) Pre diabetes:          5.7%-6.4%  Diabetes:              >6.4%  Glycemic control for   <7.0% adults with diabetes     CBG: Recent Labs  Lab 06/28/21 1921 06/28/21 2312 06/29/21 0311 06/29/21 0731 06/29/21 1057  GLUCAP 98 91 94 107* 134*    Review of Systems:   Review of Systems  Unable to perform ROS: Intubated    Past Medical History:  She,  has a past medical history of Dementia (HCC), Hyperlipidemia, Incontinence, and Thyroid disease.   Surgical History:   Past Surgical History:  Procedure Laterality Date   ABDOMINAL HYSTERECTOMY     DUPUYTREN CONTRACTURE RELEASE Left      Social History:   reports that she has quit smoking. She has been exposed to tobacco smoke. She has never used smokeless tobacco. She reports that she does not drink alcohol and does not use drugs.   Family History:  Her family history includes Cancer in her father; Dementia in her brother and mother; Parkinson's disease in her brother.   Allergies No Known Allergies   Home Medications  Prior to Admission medications   Medication Sig Start Date End Date Taking? Authorizing Provider  cholecalciferol (VITAMIN D3) 25 MCG (1000 UNIT) tablet Take 2,000 Units by mouth  daily.   Yes [provider]  donepezil (ARICEPT) 5 MG tablet TAKE 1 TABLET(5 MG) BY MOUTH AT BEDTIME Patient taking differently: Take 5 mg by mouth at bedtime. 06/04/21  Yes Koberlein, [name], MD  hydrOXYzine (ATARAX/VISTARIL) 25 MG tablet Take 1 tablet (25 mg total) by mouth 3 (three) times daily as needed. Patient taking differently: Take 25 mg by mouth 3 (three) times daily as needed (agitation). 04/03/21  Yes Sater, Pearletha Furl, MD  levothyroxine (SYNTHROID) 100 MCG tablet Take 1 tablet (100 mcg total) by mouth daily. Patient taking differently: Take 100 mcg by mouth daily before breakfast. 03/28/21  Yes Koberlein, Junell C, MD  LORazepam (ATIVAN) 0.5 MG tablet Take 1 tablet (0.5 mg total) by mouth 2 (two) times daily as needed for anxiety. Patient taking differently: Take 0.25 mg by mouth 2 (two) times daily as needed for anxiety. 06/04/21  Yes Koberlein, [name], MD  meloxicam (MOBIC) 7.5 MG tablet Take 1 tablet (7.5 mg total) by mouth daily as needed for pain. 05/18/21  Yes Koberlein, Junell C, MD  memantine (NAMENDA) 10 MG tablet TAKE 1 TABLET(10 MG) BY MOUTH TWICE DAILY Patient taking differently: Take 10 mg by mouth 2 (two) times daily. 05/10/21  Yes Koberlein, Junell C, MD  oxybutynin (DITROPAN-XL) 10 MG 24 hr tablet Take 1 tablet (10 mg total) by mouth daily. 12/25/20  Yes Koberlein, Junell C, MD  potassium chloride (KLOR-CON) 10 MEQ tablet TAKE 2 TABLETS BY MOUTH TWICE DAILY FOR 2 DAYS THEN TAKE 1 TABLET BY MOUTH EVERY DAY THEREAFTER Patient taking differently: Take 10 mEq by mouth daily. 06/06/21  Yes Wynn Banker, MD  risperiDONE (RISPERDAL) 1 MG tablet Take 1 tablet (1 mg total) by mouth 2 (two) times daily. 03/26/21  Yes Koberlein, Junell C, MD  simvastatin (ZOCOR) 40 MG tablet TAKE 1 TABLET(40 MG) BY MOUTH DAILY Patient taking differently: Take 40 mg by mouth daily. 06/06/21  Yes Koberlein, [name], MD  traZODone (DESYREL) 50 MG tablet Take 1 tablet (50 mg total) by  mouth at bedtime as needed for sleep. 03/26/21  Yes Koberlein, Junell C, MD  triamcinolone ointment (KENALOG) 0.1 % Apply 1 application topically 2 (two) times daily as needed (rash). 01/18/21  Yes [provider]  trospium (SANCTURA) 20 MG tablet Take 1 tablet (20 mg total) by mouth 2 (two) times daily. 01/09/21  Yes Sater, Pearletha Furl, MD  sulfamethoxazole-trimethoprim (BACTRIM DS) 800-160 MG tablet Take 1 tablet by mouth 2 (two) times daily. Patient not taking: No sig reported 03/26/21   Wynn Banker, MD     Critical care time: 35 minutes

## 2021-06-29 NOTE — Progress Notes (Signed)
Pt transported to/from ICU to MRI via vent w/ no apparent complications.

## 2021-06-29 NOTE — Progress Notes (Signed)
LTM d/c now. No skin breakdown noted.Marland Kitchen Results pending.

## 2021-06-29 NOTE — Procedures (Addendum)
Patient Name: Donna Patrick  MRN: [id number]  Epilepsy Attending: Charlsie Quest  Referring Physician/Provider: Tobey Grim, NP Duration: 06/28/2021 1803 to 06/29/2021 0919   Patient history: 70 year old female who presented to the emergency department status post PEA cardiac arrest after she became unresponsive in the bathroom, witnessed by nephew, who started CPR, ROSC was achieved after 15 minutes, she received 1 amp of epi. EEG to evaluate for seizure   Level of alertness: lethargic   AEDs during EEG study: LEV   Technical aspects: This EEG study was done with scalp electrodes positioned according to the 10-20 International system of electrode placement. Electrical activity was acquired at a sampling rate of 500Hz  and reviewed with a high frequency filter of 70Hz  and a low frequency filter of 1Hz . EEG data were recorded continuously and digitally stored.    Description: EEG showed continuous generalized 3-7 Hz theta-delta slowing. Hyperventilation and photic stimulation were not performed.      ABNORMALITY - Continuous slow, generalized   IMPRESSION: This study is suggestive of moderate to severe diffuse encephalopathy, nonspecific etiology but could be secondary to sedation, anoxic/hypoxic brain injury. No seizures or definite epileptiform discharges were seen throughout the recording.   Jessenya Berdan 

## 2021-06-30 ENCOUNTER — Other Ambulatory Visit: Payer: Self-pay | Admitting: Family Medicine

## 2021-06-30 DIAGNOSIS — R451 Restlessness and agitation: Secondary | ICD-10-CM

## 2021-06-30 DIAGNOSIS — J9601 Acute respiratory failure with hypoxia: Secondary | ICD-10-CM | POA: Diagnosis not present

## 2021-06-30 LAB — GLUCOSE, CAPILLARY
Glucose-Capillary: 111 mg/dL — ABNORMAL HIGH (ref 70–99)
Glucose-Capillary: 104 mg/dL — ABNORMAL HIGH (ref 70–99)
Glucose-Capillary: 117 mg/dL — ABNORMAL HIGH (ref 70–99)
Glucose-Capillary: 114 mg/dL — ABNORMAL HIGH (ref 70–99)
Glucose-Capillary: 98 mg/dL (ref 70–99)
Glucose-Capillary: 105 mg/dL — ABNORMAL HIGH (ref 70–99)

## 2021-06-30 NOTE — Progress Notes (Addendum)
NAME:  Donna Patrick, MRN:  [id number], DOB:  08-27-1950, LOS: 3 ADMISSION DATE:  07/18/2021, CONSULTATION DATE:  06/19/2021 REFERRING MD:  Alvino Chapel, CHIEF COMPLAINT:  Cardiac arrest   History of Present Illness:  Donna Patrick is a 70 y.o. F who presented after witnessed cardiac arrest at home by her son. She was going to the restroom when he heard her collapse and when he went to investigate he says she was gasping in pain and collapsed. At that point he called EMS and began CPR. EMS arrived and patient was found to be in wide PEA. CPR was given for 15 minutes and she received 1 round of epi. She did ultimately have ROSC. On arrival to ED she had spontaneous breathing but minimal neurologic functioning and was intubated. CT was negative for acute process.   Pertinent  Medical History  Alzheimer's, hypothyroidism, HLD  Significant Hospital Events: Including procedures, antibiotic start and stop dates in addition to other pertinent events   11/09 Cardiac arrest 11/09 Intubated 11/09 Cefepime 2g started 11/09 EEG concerning severe diffuse encephalopathy 11/11 Continuous EEG suggestive of moderate to severe diffuse encephalopathy, MRI without acute abnormality.  Goals of care discussion with plan for one-way extubation and transition to DNR on extubation  Interim History / Subjective:   Weaning on pressure support 5/5 Goals of care discussion yesterday with plan to do a one-way extubation when patient is ready.  She will be DNR on extubation  Objective   Blood pressure (!) 154/82, pulse 83, temperature 99.1 F (37.3 C), temperature source Bladder, resp. rate (!) 21, height 5\' 2"  (1.575 m), weight 67.8 kg, SpO2 97 %.    Vent Mode: PRVC FiO2 (%):  [30 %] 30 % Set Rate:  [24 bmp] 24 bmp Vt Set:  [400 mL] 400 mL PEEP:  [5 cmH20] 5 cmH20 Pressure Support:  [5 cmH20] 5 cmH20 Plateau Pressure:  [12 cmH20-16 cmH20] 14 cmH20   Intake/Output Summary (Last 24 hours) at 06/30/2021  0815 Last data filed at 06/30/2021 0600 Gross per 24 hour  Intake 769.84 ml  Output 3600 ml  Net -2830.16 ml    Filed Weights   06/28/21 0500 06/29/21 0500  Weight: 67.6 kg 67.8 kg    Examination: Blood pressure (!) 154/82, pulse 83, temperature 99.1 F (37.3 C), temperature source Bladder, resp. rate (!) 21, height 5\' 2"  (1.575 m), weight 67.8 kg, SpO2 97 %. Gen:      No acute distress HEENT:  EOMI, sclera anicteric Neck:     No masses; no thyromegaly, ETT  Lungs:    Clear to auscultation bilaterally; normal respiratory effort CV:         Regular rate and rhythm; no murmurs Abd:      + bowel sounds; soft, non-tender; no palpable masses, no distension Ext:    No edema; adequate peripheral perfusion Skin:      Warm and dry; no rash Neuro: Awake on the vent, follows commands  Resolved Hospital Problem list   AKI Hypokalemia Lactic acidosis  Assessment & Plan:  S/p PEA cardiac arrest, unclear etiology Patients family reports no cardiac history and that she was at baseline up to this event. Hemodynamically stable at this time.  s/p normothermia protocol EEG, MRI reviewed with no seizures or acute abnormality Mental status is improving Holding home Aricept, risperidone, trazodone   Acute hypoxic, hypercapnic respiratory failure Concern for aspiration pneumonia Weaning with plan for one-way extubation On cefepime, Vanco    Hypothyroidism TSH markedly  elevated at 25.148. She is reportedly on levothyroxine 100 mcg daily at home. - T4 slightly elevated at 1.34 Started home dose of levothyroxine 100 mcg daily on 11/11  Stage 1 Pressure injury coccyx, present on admission  Wound care consult  Goals of care Discussed with son and daughter-in-law. Plan for extubation.  Status changed to DNR  Best Practice (right click and "Reselect all SmartList Selections" daily)   Diet/type: NPO DVT prophylaxis: prophylactic heparin  GI prophylaxis: PPI Lines: N/A Foley:  Yes, and  it is still needed Code Status:  full code Last date of multidisciplinary goals of care discussion [ 11/12]. See above  Critical care time:    The patient is critically ill with multiple organ system failure and requires high complexity decision making for assessment and support, frequent evaluation and titration of therapies, advanced monitoring, review of radiographic studies and interpretation of complex data.   Critical Care Time devoted to patient care services, exclusive of separately billable procedures, described in this note is 45 minutes.   Chilton Greathouse MD [facility] See Amion for pager  If no response to pager , please call [phone] until 7pm After 7:00 pm call Elink  [phone] 06/30/2021, 8:15 AM

## 2021-06-30 NOTE — Telephone Encounter (Signed)
Patient currently in hospital and intubated. This med is not presently being given. Will hold refills until family requests.

## 2021-06-30 NOTE — Plan of Care (Signed)

## 2021-06-30 NOTE — Procedures (Signed)
Extubation Procedure Note  Patient Details:   Name: Donna Patrick DOB: 07-Dec-1950 MRN: [id number]   Airway Documentation:    Vent end date: 06/30/21 Vent end time: 1015   Evaluation  O2 sats: stable throughout Complications: No apparent complications Patient did tolerate procedure well. Bilateral Breath Sounds: Diminished, Clear   Yes, no stridor noted, no distress noted.    Jennette Kettle 06/30/2021, 10:22 AM

## 2021-07-01 DIAGNOSIS — J9601 Acute respiratory failure with hypoxia: Secondary | ICD-10-CM | POA: Diagnosis not present

## 2021-07-01 LAB — CBC
HCT: 33.1 % — ABNORMAL LOW (ref 36.0–46.0)
Hemoglobin: 11.4 g/dL — ABNORMAL LOW (ref 12.0–15.0)
MCH: 29.7 pg (ref 26.0–34.0)
MCHC: 34.4 g/dL (ref 30.0–36.0)
MCV: 86.2 fL (ref 80.0–100.0)
Platelets: 147 10*3/uL — ABNORMAL LOW (ref 150–400)
RBC: 3.84 MIL/uL — ABNORMAL LOW (ref 3.87–5.11)
RDW: 12.7 % (ref 11.5–15.5)
WBC: 7.7 10*3/uL (ref 4.0–10.5)
nRBC: 0 % (ref 0.0–0.2)

## 2021-07-01 LAB — GLUCOSE, CAPILLARY
Glucose-Capillary: 102 mg/dL — ABNORMAL HIGH (ref 70–99)
Glucose-Capillary: 104 mg/dL — ABNORMAL HIGH (ref 70–99)
Glucose-Capillary: 119 mg/dL — ABNORMAL HIGH (ref 70–99)
Glucose-Capillary: 118 mg/dL — ABNORMAL HIGH (ref 70–99)
Glucose-Capillary: 139 mg/dL — ABNORMAL HIGH (ref 70–99)
Glucose-Capillary: 91 mg/dL (ref 70–99)

## 2021-07-01 LAB — PHOSPHORUS: Phosphorus: 2.8 mg/dL (ref 2.5–4.6)

## 2021-07-01 LAB — BASIC METABOLIC PANEL
Anion gap: 9 (ref 5–15)
BUN: 6 mg/dL — ABNORMAL LOW (ref 8–23)
CO2: 29 mmol/L (ref 22–32)
Calcium: 8.5 mg/dL — ABNORMAL LOW (ref 8.9–10.3)
Chloride: 95 mmol/L — ABNORMAL LOW (ref 98–111)
Creatinine, Ser: 0.57 mg/dL (ref 0.44–1.00)
GFR, Estimated: >60 mL/min (ref >60)
Glucose, Bld: 102 mg/dL — ABNORMAL HIGH (ref 70–99)
Potassium: 3.5 mmol/L (ref 3.5–5.1)
Sodium: 133 mmol/L — ABNORMAL LOW (ref 135–145)

## 2021-07-01 LAB — MAGNESIUM: Magnesium: 2.2 mg/dL (ref 1.7–2.4)

## 2021-07-01 MED ORDER — POLYETHYLENE GLYCOL 3350 17 G PO PACK
17.0000 g | PACK | Freq: Every day | ORAL | Status: DC
  Administered 2021-07-02 – 2021-07-09 (×5): 17 g via ORAL
  Filled 2021-07-01 (×6): qty 1

## 2021-07-01 MED ORDER — COVID-19MRNA BIVAL VACC PFIZER 30 MCG/0.3ML IM SUSP
0.3000 mL | Freq: Once | INTRAMUSCULAR | Status: AC
  Administered 2021-07-02: 0.3 mL via INTRAMUSCULAR
  Filled 2021-07-01: qty 0.3

## 2021-07-01 MED ORDER — POTASSIUM CHLORIDE 10 MEQ/100ML IV SOLN
10.0000 meq | INTRAVENOUS | Status: AC
  Administered 2021-07-01 (×2): 10 meq via INTRAVENOUS
  Filled 2021-07-01 (×2): qty 100

## 2021-07-01 MED ORDER — HYDRALAZINE HCL 20 MG/ML IJ SOLN
10.0000 mg | INTRAMUSCULAR | Status: DC | PRN
  Administered 2021-07-01 – 2021-07-04 (×4): 10 mg via INTRAVENOUS
  Filled 2021-07-01 (×5): qty 1

## 2021-07-01 MED ORDER — LEVOTHYROXINE SODIUM 100 MCG PO TABS
100.0000 ug | ORAL_TABLET | Freq: Every day | ORAL | Status: DC
  Administered 2021-07-01 – 2021-07-04 (×3): 100 ug via ORAL
  Filled 2021-07-01 (×4): qty 1

## 2021-07-01 MED ORDER — DOCUSATE SODIUM 50 MG/5ML PO LIQD
100.0000 mg | Freq: Two times a day (BID) | ORAL | Status: DC
  Administered 2021-07-01 – 2021-07-03 (×5): 100 mg via ORAL
  Filled 2021-07-01 (×5): qty 10

## 2021-07-01 MED ORDER — PANTOPRAZOLE 2 MG/ML SUSPENSION
40.0000 mg | Freq: Every day | ORAL | Status: DC
  Filled 2021-07-01: qty 20

## 2021-07-01 NOTE — Progress Notes (Signed)
Pharmacy Electrolyte Replacement  Recent Labs:  Recent Labs    07/01/21 0311  K 3.5  MG 2.2  PHOS 2.8  CREATININE 0.57    Low Critical Values (K </= 2.5, Phos </= 1, Mg </= 1) Present: None  MD Contacted:   Plan:  - K 3.5 - K runs x 4 - Recheck K with AM labs  Thank you for allowing pharmacy to be a part of this patient's care.  Georgina Pillion, PharmD, BCPS Clinical Pharmacist Clinical phone for 07/01/2021: [phone] 07/01/2021 7:39 AM   **Pharmacist phone directory can now be found on [email or web address] ([email or web address]).  Listed under [facility].

## 2021-07-01 NOTE — Evaluation (Signed)
Clinical/Bedside Swallow Evaluation Patient Details  Name: Donna Patrick MRN: [id number] Date of Birth: 02-05-1951  Today's Date: 07/01/2021 Time: SLP Start Time (ACUTE ONLY): 9417 SLP Stop Time (ACUTE ONLY): 0913 SLP Time Calculation (min) (ACUTE ONLY): 20 min  Past Medical History:  Past Medical History:  Diagnosis Date   Dementia (HCC)    diagnosed 8-10 years ago   Hyperlipidemia    Incontinence    Thyroid disease    Past Surgical History:  Past Surgical History:  Procedure Laterality Date   ABDOMINAL HYSTERECTOMY     DUPUYTREN CONTRACTURE RELEASE Left    HPI:  Pt is a 70 year old female with who presented to the ED on 11/9  after witnessed cardiac arrest at home by her son. Pt found to be in wide PEA by EMS. CPR was given for 15 minutes, she received 1 round of epi, and ultimately achieved ROSC. On arrival to ED she had spontaneous breathing but minimal neurologic functioning and was intubated. CT head negative. ETT 11/9-11/12. PMH: Alzheimer's, hypothyroidism, HLD    Assessment / Plan / Recommendation  Clinical Impression  Pt was seen for bedside swallow evaluation with her son and daughter-in-law present for part of the evaluation. Pt's family reported mastication of pills with subsequent pocketing and anterior spillage. A complete oral mechanism exam was not conducted due to pt's difficulty following commands. Pt allowed limited oral inspection, but some white coating was noted on the pt's tongue and hard palate, suggesting possible thrush. Pt was edentulous; dentures are currently at home, and pt's family stated that the pt never eats without them. She tolerated puree and thin liquids without overt s/sx of aspiration. Mild oral holding was intermittently noted, and pt required encouragement to accept larger boluses. However, oral clearance was adequate. A dysphagia 1 diet with thin liquids will be initiated at this time. SLP will follow to assess diet tolerance and for  advancement as clinically indicated once her dentures are brought. SLP Visit Diagnosis: Dysphagia, unspecified (R13.10)    Aspiration Risk  Mild aspiration risk    Diet Recommendation Dysphagia 1 (Puree);Thin liquid   Liquid Administration via: Cup;Straw Medication Administration: Crushed with puree Supervision: Staff to assist with self feeding Compensations: Slow rate;Small sips/bites;Minimize environmental distractions Postural Changes: Seated upright at 90 degrees    Other  Recommendations Oral Care Recommendations: Oral care BID;Staff/trained caregiver to provide oral care    Recommendations for follow up therapy are one component of a multi-disciplinary discharge planning process, led by the attending physician.  Recommendations may be updated based on patient status, additional functional criteria and insurance authorization.  Follow up Recommendations  (TBD)      Assistance Recommended at Discharge Frequent or constant Supervision/Assistance  Functional Status Assessment    Frequency and Duration min 2x/week  1 week       Prognosis Prognosis for Safe Diet Advancement: Fair Barriers to Reach Goals: Cognitive deficits      Swallow Study   General Date of Onset: 06/30/21 HPI: Pt is a 70 year old female with who presented to the ED on 11/9  after witnessed cardiac arrest at home by her son. Pt found to be in wide PEA by EMS. CPR was given for 15 minutes, she received 1 round of epi, and ultimately achieved ROSC. On arrival to ED she had spontaneous breathing but minimal neurologic functioning and was intubated. CT head negative. ETT 11/9-11/12. PMH: Alzheimer's, hypothyroidism, HLD Type of Study: Bedside Swallow Evaluation Previous Swallow Assessment: none Diet  Prior to this Study: NPO Temperature Spikes Noted: No Respiratory Status: Nasal cannula History of Recent Intubation: Yes Length of Intubations (days): 3 days Date extubated: 06/30/21 Behavior/Cognition:  Alert;Pleasant mood;Cooperative Oral Cavity Assessment: Within Functional Limits Oral Care Completed by SLP: No Oral Cavity - Dentition: Edentulous;Dentures, not available (At home per son and daughter-in-law) Vision: Functional for self-feeding Self-Feeding Abilities: Total assist Patient Positioning: Upright in bed;Postural control adequate for testing Baseline Vocal Quality: Normal Volitional Cough: Cognitively unable to elicit Volitional Swallow: Able to elicit    Oral/Motor/Sensory Function Overall Oral Motor/Sensory Function:  (Difficult to assess)   Ice Chips Ice chips: Not tested (refused)   Thin Liquid Thin Liquid: Within functional limits Presentation: Straw    Nectar Thick Nectar Thick Liquid: Not tested   Honey Thick Honey Thick Liquid: Not tested   Puree Puree: Impaired Presentation: Spoon Oral Phase Impairments: Poor awareness of bolus Oral Phase Functional Implications: Oral holding   Solid     Solid: Not tested (Deferred; dentures not at hospital)     Donna Patrick I. Vear Clock, MS, CCC-SLP Acute Rehabilitation Services Office number [phone] Pager [phone]   Donna Patrick 07/01/2021,9:32 AM

## 2021-07-01 NOTE — Progress Notes (Signed)
NAME:  Donna Patrick, MRN:  [id number], DOB:  13-Feb-1951, LOS: 4 ADMISSION DATE:  06/21/2021, CONSULTATION DATE:  07/14/2021 REFERRING MD:  Rubin Payor, CHIEF COMPLAINT:  Cardiac arrest   History of Present Illness:  Donna Patrick is a 70 y.o. F who presented after witnessed cardiac arrest at home by her son. She was going to the restroom when he heard her collapse and when he went to investigate he says she was gasping in pain and collapsed. At that point he called EMS and began CPR. EMS arrived and patient was found to be in wide PEA. CPR was given for 15 minutes and she received 1 round of epi. She did ultimately have ROSC. On arrival to ED she had spontaneous breathing but minimal neurologic functioning and was intubated. CT was negative for acute process.   Pertinent  Medical History  Alzheimer's, hypothyroidism, HLD  Significant Hospital Events: Including procedures, antibiotic start and stop dates in addition to other pertinent events   11/09 Cardiac arrest 11/09 Intubated 11/09 Cefepime 2g started 11/09 EEG concerning severe diffuse encephalopathy 11/11 Continuous EEG suggestive of moderate to severe diffuse encephalopathy, MRI without acute abnormality.  Goals of care discussion with plan for one-way extubation and transition to DNR on extubation 11/12 Extubated  Interim History / Subjective:  Patient is resting in bed and does not appear to be in any distress. She denies discomfort or pain.   Objective   Blood pressure (!) 153/71, pulse 78, temperature 99 F (37.2 C), resp. rate (!) 24, height 5\' 2"  (1.575 m), weight 67.8 kg, SpO2 90 %.        Intake/Output Summary (Last 24 hours) at 07/01/2021 0807 Last data filed at 07/01/2021 0600 Gross per 24 hour  Intake --  Output 3420 ml  Net -3420 ml   Filed Weights   06/28/21 0500 06/29/21 0500  Weight: 67.6 kg 67.8 kg    Examination: Constitutional: Elderly female resting in bed. No acute distress. Cardio: Regular rate  and rhythm. No murmurs, rubs, gallops. Pulm: Clear to auscultation bilaterally. Satting appropriately on 1L [place]. Abdomen: Soft, non-tender. MSK: No edema. Skin: Skin is warm and dry. Neuro: Somnolent but arouses and follows commands appropriately.   Resolved Hospital Problem list   AKI Hypokalemia Lactic acidosis  Assessment & Plan:  S/p PEA cardiac arrest, unclear etiology Patients family reports no cardiac history and that she was at baseline up to this event. Hemodynamically stable at this time.  - Holding home Aricept, risperidone, trazodone    Acute hypoxic, hypercapnic respiratory failure Concern for aspiration pneumonia - On cefepime, vancomycin day 4/5 - Extubated 11/12   Hypothyroidism TSH markedly elevated at 25.148 with T4 slightly elevated at 1.34. She is reportedly on levothyroxine 100 mcg daily at home. - Continue home dose of levothyroxine 100 mcg daily   Stage 1 Pressure injury coccyx, present on admission  - Wound care consulted  Best Practice (right click and "Reselect all SmartList Selections" daily)   Diet/type: Dysphagia 1 DVT prophylaxis: prophylactic heparin  GI prophylaxis: PPI Lines: N/A Foley:  Yes, and it is still needed Code Status:  DNR Last date of multidisciplinary goals of care discussion [11/12]  Labs   CBC: Recent Labs  Lab 06/25/2021 1023 07/09/2021 1034 07/09/2021 1145 07/12/2021 1512 06/28/21 0452 06/29/21 0355 07/01/21 0311  WBC 20.9*  --   --   --  10.8* 7.7 7.7  NEUTROABS 16.5*  --   --   --   --   --   --  HGB 13.2   < > 10.5* 10.9* 13.2 11.8* 11.4*  HCT 38.8   < > 31.0* 32.0* 40.0 34.5* 33.1*  MCV 88.6  --   --   --  89.5 89.1 86.2  PLT 183  --   --   --  147* PLATELET CLUMPS NOTED ON SMEAR, COUNT APPEARS DECREASED 147*   < > = values in this interval not displayed.    Basic Metabolic Panel: Recent Labs  Lab 07/16/2021 1023 06/22/2021 1034 06/23/2021 1035 07/18/2021 1145 06/22/2021 1512 06/28/21 0452 06/29/21 0355  07/01/21 0311  NA 136   < > 137 142 141 137 137 133*  K 3.8   < > 4.5 3.1* 3.7 5.0 3.6 3.5  CL 103  --  105  --   --  104 105 95*  CO2 20*  --   --   --   --  23 24 29   GLUCOSE 220*  --  223*  --   --  118* 117* 102*  BUN 21  --  29*  --   --  18 13 6*  CREATININE 1.24*  --  1.10*  --   --  0.90 0.70 0.57  CALCIUM 8.8*  --   --   --   --  8.3* 8.0* 8.5*  MG  --   --   --   --   --  2.5* 2.1 2.2  PHOS  --   --   --   --   --  2.9 2.2* 2.8   < > = values in this interval not displayed.   GFR: Estimated Creatinine Clearance: 60.8 mL/min (by C-G formula based on SCr of 0.57 mg/dL). Recent Labs  Lab 06/19/2021 1023 06/28/2021 1636 06/24/2021 2232 06/28/21 0451 06/28/21 0452 06/29/21 0355 06/29/21 2042 07/01/21 0311  WBC 20.9*  --   --   --  10.8* 7.7  --  7.7  LATICACIDVEN 3.9* 3.6* 4.3* 3.3*  --   --  1.0  --     Liver Function Tests: Recent Labs  Lab 07/08/2021 1023 06/28/21 0452  AST 108* 57*  ALT 77* 64*  ALKPHOS 90 85  BILITOT 1.7* 0.7  PROT 6.3* 5.9*  ALBUMIN 3.4* 3.0*   No results for input(s): LIPASE, AMYLASE in the last 168 hours. No results for input(s): AMMONIA in the last 168 hours.  ABG    Component Value Date/Time   PHART 7.324 (L) 06/21/2021 1512   PCO2ART 43.1 06/29/2021 1512   PO2ART 94 06/20/2021 1512   HCO3 22.6 06/30/2021 1512   TCO2 24 07/04/2021 1512   ACIDBASEDEF 4.0 (H) 06/29/2021 1512   O2SAT 97.0 07/07/2021 1512     Coagulation Profile: No results for input(s): INR, PROTIME in the last 168 hours.  Cardiac Enzymes: No results for input(s): CKTOTAL, CKMB, CKMBINDEX, TROPONINI in the last 168 hours.  HbA1C: Hgb A1c MFr Bld  Date/Time Value Ref Range Status  06/22/2021 10:23 AM 5.2 4.8 - 5.6 % Final    Comment:    (NOTE) Pre diabetes:          5.7%-6.4%  Diabetes:              >6.4%  Glycemic control for   <7.0% adults with diabetes     CBG: Recent Labs  Lab 06/30/21 1801 06/30/21 1928 06/30/21 2324 07/01/21 0337  07/01/21 0725  GLUCAP 114* 98 105* 102* 104*    Review of Systems:   Review of Systems  Unable  to perform ROS: Dementia    Past Medical History:  She,  has a past medical history of Dementia (HCC), Hyperlipidemia, Incontinence, and Thyroid disease.   Surgical History:   Past Surgical History:  Procedure Laterality Date   ABDOMINAL HYSTERECTOMY     DUPUYTREN CONTRACTURE RELEASE Left      Social History:   reports that she has quit smoking. She has been exposed to tobacco smoke. She has never used smokeless tobacco. She reports that she does not drink alcohol and does not use drugs.   Family History:  Her family history includes Cancer in her father; Dementia in her brother and mother; Parkinson's disease in her brother.   Allergies No Known Allergies   Home Medications  Prior to Admission medications   Medication Sig Start Date End Date Taking? Authorizing Provider  cholecalciferol (VITAMIN D3) 25 MCG (1000 UNIT) tablet Take 2,000 Units by mouth daily.   Yes [provider]  donepezil (ARICEPT) 5 MG tablet TAKE 1 TABLET(5 MG) BY MOUTH AT BEDTIME Patient taking differently: Take 5 mg by mouth at bedtime. 06/04/21  Yes Koberlein, [name], MD  hydrOXYzine (ATARAX/VISTARIL) 25 MG tablet Take 1 tablet (25 mg total) by mouth 3 (three) times daily as needed. Patient taking differently: Take 25 mg by mouth 3 (three) times daily as needed (agitation). 04/03/21  Yes Sater, Pearletha Furl, MD  levothyroxine (SYNTHROID) 100 MCG tablet Take 1 tablet (100 mcg total) by mouth daily. Patient taking differently: Take 100 mcg by mouth daily before breakfast. 03/28/21  Yes Koberlein, Junell C, MD  LORazepam (ATIVAN) 0.5 MG tablet Take 1 tablet (0.5 mg total) by mouth 2 (two) times daily as needed for anxiety. Patient taking differently: Take 0.25 mg by mouth 2 (two) times daily as needed for anxiety. 06/04/21  Yes Koberlein, [name], MD  meloxicam (MOBIC) 7.5 MG tablet Take 1 tablet (7.5 mg  total) by mouth daily as needed for pain. 05/18/21  Yes Koberlein, Junell C, MD  memantine (NAMENDA) 10 MG tablet TAKE 1 TABLET(10 MG) BY MOUTH TWICE DAILY Patient taking differently: Take 10 mg by mouth 2 (two) times daily. 05/10/21  Yes Koberlein, Junell C, MD  oxybutynin (DITROPAN-XL) 10 MG 24 hr tablet Take 1 tablet (10 mg total) by mouth daily. 12/25/20  Yes Koberlein, Junell C, MD  potassium chloride (KLOR-CON) 10 MEQ tablet TAKE 2 TABLETS BY MOUTH TWICE DAILY FOR 2 DAYS THEN TAKE 1 TABLET BY MOUTH EVERY DAY THEREAFTER Patient taking differently: Take 10 mEq by mouth daily. 06/06/21  Yes Koberlein, Junell C, MD  risperiDONE (RISPERDAL) 1 MG tablet Take 1 tablet (1 mg total) by mouth 2 (two) times daily. 03/26/21  Yes Koberlein, Junell C, MD  simvastatin (ZOCOR) 40 MG tablet TAKE 1 TABLET(40 MG) BY MOUTH DAILY Patient taking differently: Take 40 mg by mouth daily. 06/06/21  Yes Koberlein, [name], MD  traZODone (DESYREL) 50 MG tablet Take 1 tablet (50 mg total) by mouth at bedtime as needed for sleep. 03/26/21  Yes Koberlein, Junell C, MD  triamcinolone ointment (KENALOG) 0.1 % Apply 1 application topically 2 (two) times daily as needed (rash). 01/18/21  Yes [provider]  trospium (SANCTURA) 20 MG tablet Take 1 tablet (20 mg total) by mouth 2 (two) times daily. 01/09/21  Yes Sater, Pearletha Furl, MD  sulfamethoxazole-trimethoprim (BACTRIM DS) 800-160 MG tablet Take 1 tablet by mouth 2 (two) times daily. Patient not taking: No sig reported 03/26/21   Wynn Banker, MD  Critical care time: 30 minutes

## 2021-07-02 DIAGNOSIS — N179 Acute kidney failure, unspecified: Secondary | ICD-10-CM | POA: Diagnosis not present

## 2021-07-02 LAB — BASIC METABOLIC PANEL
Anion gap: 11 (ref 5–15)
BUN: 10 mg/dL (ref 8–23)
CO2: 25 mmol/L (ref 22–32)
Calcium: 9 mg/dL (ref 8.9–10.3)
Chloride: 99 mmol/L (ref 98–111)
Creatinine, Ser: 0.65 mg/dL (ref 0.44–1.00)
GFR, Estimated: >60 mL/min (ref >60)
Glucose, Bld: 103 mg/dL — ABNORMAL HIGH (ref 70–99)
Potassium: 3.7 mmol/L (ref 3.5–5.1)
Sodium: 135 mmol/L (ref 135–145)

## 2021-07-02 LAB — GLUCOSE, CAPILLARY
Glucose-Capillary: 102 mg/dL — ABNORMAL HIGH (ref 70–99)
Glucose-Capillary: 91 mg/dL (ref 70–99)
Glucose-Capillary: 136 mg/dL — ABNORMAL HIGH (ref 70–99)
Glucose-Capillary: 123 mg/dL — ABNORMAL HIGH (ref 70–99)
Glucose-Capillary: 135 mg/dL — ABNORMAL HIGH (ref 70–99)
Glucose-Capillary: 94 mg/dL (ref 70–99)

## 2021-07-02 LAB — CULTURE, BLOOD (ROUTINE X 2)
Culture: NO GROWTH
Culture: NO GROWTH
Special Requests: ADEQUATE

## 2021-07-02 MED ORDER — DONEPEZIL HCL 5 MG PO TABS
5.0000 mg | ORAL_TABLET | Freq: Every day | ORAL | Status: DC
  Administered 2021-07-02 – 2021-07-09 (×8): 5 mg via ORAL
  Filled 2021-07-02 (×10): qty 1

## 2021-07-02 MED ORDER — POTASSIUM CHLORIDE 20 MEQ PO PACK
40.0000 meq | PACK | Freq: Once | ORAL | Status: AC
  Administered 2021-07-02: 40 meq via ORAL
  Filled 2021-07-02: qty 2

## 2021-07-02 MED ORDER — METOPROLOL SUCCINATE ER 25 MG PO TB24
12.5000 mg | ORAL_TABLET | Freq: Every day | ORAL | Status: DC
  Administered 2021-07-02 – 2021-07-09 (×8): 12.5 mg via ORAL
  Filled 2021-07-02 (×8): qty 1

## 2021-07-02 MED ORDER — MEMANTINE HCL 5 MG PO TABS
5.0000 mg | ORAL_TABLET | Freq: Two times a day (BID) | ORAL | Status: DC
  Administered 2021-07-02 – 2021-07-09 (×14): 5 mg via ORAL
  Filled 2021-07-02 (×18): qty 1

## 2021-07-02 MED ORDER — POTASSIUM CHLORIDE CRYS ER 20 MEQ PO TBCR: 40.0000 meq | EXTENDED_RELEASE_TABLET | Freq: Once | ORAL | Status: DC

## 2021-07-02 NOTE — Progress Notes (Signed)
PROGRESS NOTE   Donna Patrick  VQQ:[id number] DOB: 1951/02/07 DOA: 26-Jul-2021 PCP: Wynn Banker, MD  Brief Narrative:  70 year old white female community dwelling, moderate dementia, recent loss of daughter 3 weeks prior to admission Recent concern for urinary tract infection--son was in the process of taking her to her physician to be seen Patient developed unresponsiveness 07-26-2021 after she sustained a cardiac arrest Patient found to be in PEA arrest 10 received 15 minutes of CPR prior to ROSC  11/09 Cardiac arrest 11/09 Intubated 11/09 Cefepime 2g started 11/09 EEG concerning severe diffuse encephalopathy 11/11 Continuous EEG suggestive of moderate to severe diffuse encephalopathy, MRI without acute abnormality.  Goals of care discussion with plan for one-way extubation and transition to DNR on extubation 11/12 Extubated   Hospital-Problem based course  PEA arrest query cause Etiology unclear Keep on monitors for now Sinus tachycardia Probably from anxiety/mild confusion at times-we will place Toprol-XL as a standing order 12.5 Mild underlying dementia without concern for anoxic brain injury Reorient as able--discontinue fentanyl/Dilaudid resume Aricept 5 at bedtime Namenda 10 twice daily hold Risperdal 1 mg twice daily for behaviors ,hold Ativan at this time  ?  Pneumonia Received 5 days of cefepime and vancomycin-have discontinued given lack of objective evidence supporting Hypothyroidism TSH was very elevated at 25 but this is in the setting of critical illness continue levothyroxine 100 mcg daily Recheck in 3 weeks Stage 1 Coccygeal pressure ulcer Turn and follow wound care instructions   DVT prophylaxis: Lovenox Code Status: DNR Family Communication: Discussed with patient's son at the bedside can [employer] [phone] Disposition:  Status is: Inpatient  Remains inpatient appropriate because: Await rehab bed       Consultants:    Procedures:    Antimicrobials: completed 11/14 , 5 dasy of Abx    Subjective: Awake inciherent no distress Other than sinus tach seems comfortable   Objective: Vitals:   07/02/21 1100 07/02/21 1200 07/02/21 1300 07/02/21 1400  BP: (!) 153/100 (!) 184/98 (!) 162/109 (!) 157/92  Pulse: (!) 105 (!) 104 (!) 123 (!) 105  Resp: 19 17 19 15   Temp:  98.7 F (37.1 C)    TempSrc:  Oral    SpO2: 95% 95% 97% 97%  Weight:      Height:        Intake/Output Summary (Last 24 hours) at 07/02/2021 1408 Last data filed at 07/02/2021 1200 Gross per 24 hour  Intake 350 ml  Output 1325 ml  Net -975 ml   Filed Weights   06/28/21 0500 06/29/21 0500  Weight: 67.6 kg 67.8 kg    Examination:  Eomi ncat no focal deficit Awake coherent in nad no focal deficit Abd soft nt nd no rebound no guard ROM intact but limited exam secondary to understanding of what is beiong asked Psych flat-anxious  Data Reviewed: personally reviewed   CBC    Component Value Date/Time   WBC 7.7 07/01/2021 0311   RBC 3.84 (L) 07/01/2021 0311   HGB 11.4 (L) 07/01/2021 0311   HCT 33.1 (L) 07/01/2021 0311   PLT 147 (L) 07/01/2021 0311   MCV 86.2 07/01/2021 0311   MCH 29.7 07/01/2021 0311   MCHC 34.4 07/01/2021 0311   RDW 12.7 07/01/2021 0311   LYMPHSABS 2.4 07-26-21 1023   MONOABS 1.2 (H) 07-26-2021 1023   EOSABS 0.1 2021/07/26 1023   BASOSABS 0.1 2021-07-26 1023   CMP Latest Ref Rng & Units 07/02/2021 07/01/2021 06/29/2021  Glucose 70 - 99 mg/dL 13/06/2021)  102(H) 117(H)  BUN 8 - 23 mg/dL 10 6(L) 13  Creatinine 0.44 - 1.00 mg/dL 0.65 0.57 0.70  Sodium 135 - 145 mmol/L 135 133(L) 137  Potassium 3.5 - 5.1 mmol/L 3.7 3.5 3.6  Chloride 98 - 111 mmol/L 99 95(L) 105  CO2 22 - 32 mmol/L 25 29 24   Calcium 8.9 - 10.3 mg/dL 9.0 8.5(L) 8.0(L)  Total Protein 6.5 - 8.1 g/dL - - -  Total Bilirubin 0.3 - 1.2 mg/dL - - -  Alkaline Phos 38 - 126 U/L - - -  AST 15 - 41 U/L - - -  ALT 0 - 44 U/L - - -     Radiology Studies: No  results found.   Scheduled Meds:  acetaminophen  650 mg Oral Q4H   Chlorhexidine Gluconate Cloth  6 each Topical Daily   COVID-19 mRNA bivalent vaccine (Pfizer)  0.3 mL Intramuscular ONCE-1600   docusate  100 mg Oral BID   heparin  5,000 Units Subcutaneous Q8H   insulin aspart  0-15 Units Subcutaneous Q4H   levothyroxine  100 mcg Oral Daily   mupirocin ointment  1 application Nasal BID   polyethylene glycol  17 g Oral Daily   Continuous Infusions:  sodium chloride Stopped (07/13/2021 1929)     LOS: 5 days   Time spent: 36  Nita Sells, MD [facility] To contact the attending provider between 7A-7P or the covering provider during after hours 7P-7A, please log into the web site [email or web address] and access using universal [place] password for that web site. If you do not have the password, please call the hospital operator.  07/02/2021, 2:08 PM

## 2021-07-02 NOTE — Evaluation (Addendum)
Physical Therapy Evaluation Patient Details Name: Donna Patrick MRN: [id number] DOB: 1951-07-13 Today's Date: 07/02/2021  History of Present Illness  69 yo admitted 11/9 after PEA arrest at home with ROSC after 15 min CPR. Intubated 11/9-11/12. Pt with encephalopathy. PMHx: Alzheimers, hypothyroidism, HLD  Clinical Impression  Pt with flat affect, left inattention, rigid and right lean/flexion of neck and trunk throughout session. Pt requires max +2 assist for all mobility at this time and anticipate ST-SNf at D/C as pt was walking prior to admission. Pt with decreased cognition, strength, function, and balance who will benefit from acute therapy to maximize mobility safety and function to decrease burden of care.         Recommendations for follow up therapy are one component of a multi-disciplinary discharge planning process, led by the attending physician.  Recommendations may be updated based on patient status, additional functional criteria and insurance authorization.  Follow Up Recommendations Skilled nursing-short term rehab (<3 hours/day)    Assistance Recommended at Discharge Frequent or constant Supervision/Assistance  Functional Status Assessment Patient has had a recent decline in their functional status and/or demonstrates limited ability to make significant improvements in function in a reasonable and predictable amount of time  Equipment Recommendations  Hospital bed;Wheelchair (measurements PT);Other (comment) (hoyer lift)    Recommendations for Other Services OT consult     Precautions / Restrictions Precautions Precautions: Fall Precaution Comments: very stiff with left inattention and right lean      Mobility  Bed Mobility Overal bed mobility: Needs Assistance Bed Mobility: Supine to Sit     Supine to sit: HOB elevated;Max assist     General bed mobility comments: max physical assist with pad to pivot to EOB on left with multimodal cues and increased time.  Pt with right/posterior lean in sitting    Transfers Overall transfer level: Needs assistance   Transfers: Sit to/from Stand;Bed to chair/wheelchair/BSC Sit to Stand: Max assist;+2 physical assistance Stand pivot transfers: Total assist;+2 physical assistance         General transfer comment: max +2 with pad to rise from sitting to standing x 3 trials with assist for pericare in standing and bil knees blocked to stand. total assist to pivot from bed to chair with pt remaining very rigid with right lean    Ambulation/Gait               General Gait Details: unable  Stairs            Wheelchair Mobility    Modified Rankin (Stroke Patients Only)       Balance Overall balance assessment: Needs assistance Sitting-balance support: No upper extremity supported;Feet supported Sitting balance-Leahy Scale: Zero Sitting balance - Comments: mod assist for sitting balance Postural control: Posterior lean;Right lateral lean Standing balance support: Bilateral upper extremity supported Standing balance-Leahy Scale: Zero Standing balance comment: pt with max assist for standing balance                             Pertinent Vitals/Pain Pain Assessment: PAINAD Breathing: normal Negative Vocalization: none Facial Expression: smiling or inexpressive Body Language: tense, distressed pacing, fidgeting Consolability: no need to console PAINAD Score: 1    Home Living Family/patient expects to be discharged to:: Skilled nursing facility                   Additional Comments: Pt unable to provide home setup or PLOF and family number in  chart went directly to voicemail    Prior Function Prior Level of Function : Needs assist             Mobility Comments: pt was walking before admission but unable to confirm assist required       Hand Dominance        Extremity/Trunk Assessment   Upper Extremity Assessment Upper Extremity Assessment: RUE  deficits/detail;LUE deficits/detail;Difficult to assess due to impaired cognition RUE Deficits / Details: pt with minimal active movement LUE Deficits / Details: pt with minimal active movement grossly 1/5    Lower Extremity Assessment Lower Extremity Assessment: RLE deficits/detail;LLE deficits/detail RLE Deficits / Details: grossly 2-/5 pt with minimal active movement LLE Deficits / Details: ossly 2-/5 pt with minimal active movement    Cervical / Trunk Assessment Cervical / Trunk Assessment: Other exceptions Cervical / Trunk Exceptions: pt with right lean, right neck rotation and flexion with left inattention  Communication   Communication: No difficulties  Cognition Arousal/Alertness: Awake/alert Behavior During Therapy: Flat affect Overall Cognitive Status: No family/caregiver present to determine baseline cognitive functioning Area of Impairment: Orientation;Attention;Memory;Following commands;Safety/judgement;Awareness;Problem solving                 Orientation Level: Disoriented to;Situation;Place;Time Current Attention Level: Focused Memory: Decreased short-term memory;Decreased recall of precautions Following Commands: Follows one step commands inconsistently Safety/Judgement: Decreased awareness of safety;Decreased awareness of deficits   Problem Solving: Slow processing;Decreased initiation;Difficulty sequencing;Requires verbal cues;Requires tactile cues General Comments: pt very stiff and rigid with lack of command following. left inattention.  pt will provide minimal assist with multimodal cues and physical assist to initiate task        General Comments      Exercises     Assessment/Plan    PT Assessment Patient needs continued PT services  PT Problem List Decreased strength;Decreased mobility;Decreased safety awareness;Decreased activity tolerance;Decreased balance;Decreased knowledge of use of DME       PT Treatment Interventions DME  instruction;Therapeutic exercise;Gait training;Balance training;Functional mobility training;Therapeutic activities;Patient/family education;Cognitive remediation    PT Goals (Current goals can be found in the Care Plan section)  Acute Rehab PT Goals PT Goal Formulation: Patient unable to participate in goal setting Time For Goal Achievement: 07/16/21 Potential to Achieve Goals: Poor    Frequency Min 2X/week   Barriers to discharge Decreased caregiver support      Co-evaluation               AM-PAC PT "6 Clicks" Mobility  Outcome Measure Help needed turning from your back to your side while in a flat bed without using bedrails?: A Lot Help needed moving from lying on your back to sitting on the side of a flat bed without using bedrails?: Total Help needed moving to and from a bed to a chair (including a wheelchair)?: Total Help needed standing up from a chair using your arms (e.g., wheelchair or bedside chair)?: Total Help needed to walk in hospital room?: Total Help needed climbing 3-5 steps with a railing? : Total 6 Click Score: 7    End of Session   Activity Tolerance: Patient tolerated treatment well Patient left: in chair;with call bell/phone within reach;with chair alarm set Nurse Communication: Mobility status;Need for lift equipment (Max +2 pivot with pad or lift) PT Visit Diagnosis: Other abnormalities of gait and mobility (R26.89);Difficulty in walking, not elsewhere classified (R26.2);Muscle weakness (generalized) (M62.81)    Time: 7342-8768 PT Time Calculation (min) (ACUTE ONLY): 20 min   Charges:   PT Evaluation $  PT Eval Moderate Complexity: 1 Mod          Treasa Bradshaw P, PT Acute Rehabilitation Services Pager: [phone] Office: [phone]   Enedina Finner Elorah Dewing 07/02/2021, 11:09 AM

## 2021-07-03 ENCOUNTER — Other Ambulatory Visit: Payer: Self-pay | Admitting: Family Medicine

## 2021-07-03 DIAGNOSIS — N179 Acute kidney failure, unspecified: Secondary | ICD-10-CM | POA: Diagnosis not present

## 2021-07-03 DIAGNOSIS — G4700 Insomnia, unspecified: Secondary | ICD-10-CM

## 2021-07-03 LAB — COMPREHENSIVE METABOLIC PANEL
ALT: 35 U/L (ref 0–44)
AST: 30 U/L (ref 15–41)
Albumin: 3 g/dL — ABNORMAL LOW (ref 3.5–5.0)
Alkaline Phosphatase: 115 U/L (ref 38–126)
Anion gap: 10 (ref 5–15)
BUN: 11 mg/dL (ref 8–23)
CO2: 25 mmol/L (ref 22–32)
Calcium: 8.8 mg/dL — ABNORMAL LOW (ref 8.9–10.3)
Chloride: 100 mmol/L (ref 98–111)
Creatinine, Ser: 0.68 mg/dL (ref 0.44–1.00)
GFR, Estimated: >60 mL/min (ref >60)
Glucose, Bld: 98 mg/dL (ref 70–99)
Potassium: 3 mmol/L — ABNORMAL LOW (ref 3.5–5.1)
Sodium: 135 mmol/L (ref 135–145)
Total Bilirubin: 0.7 mg/dL (ref 0.3–1.2)
Total Protein: 5.6 g/dL — ABNORMAL LOW (ref 6.5–8.1)

## 2021-07-03 LAB — CBC
HCT: 35.2 % — ABNORMAL LOW (ref 36.0–46.0)
Hemoglobin: 11.9 g/dL — ABNORMAL LOW (ref 12.0–15.0)
MCH: 29.8 pg (ref 26.0–34.0)
MCHC: 33.8 g/dL (ref 30.0–36.0)
MCV: 88 fL (ref 80.0–100.0)
Platelets: 252 10*3/uL (ref 150–400)
RBC: 4 MIL/uL (ref 3.87–5.11)
RDW: 13 % (ref 11.5–15.5)
WBC: 8.8 10*3/uL (ref 4.0–10.5)
nRBC: 0 % (ref 0.0–0.2)

## 2021-07-03 LAB — GLUCOSE, CAPILLARY
Glucose-Capillary: 90 mg/dL (ref 70–99)
Glucose-Capillary: 106 mg/dL — ABNORMAL HIGH (ref 70–99)
Glucose-Capillary: 143 mg/dL — ABNORMAL HIGH (ref 70–99)
Glucose-Capillary: 174 mg/dL — ABNORMAL HIGH (ref 70–99)
Glucose-Capillary: 113 mg/dL — ABNORMAL HIGH (ref 70–99)

## 2021-07-03 MED ORDER — INSULIN ASPART 100 UNIT/ML IJ SOLN
0.0000 [IU] | Freq: Three times a day (TID) | INTRAMUSCULAR | Status: DC
  Administered 2021-07-03: 2 [IU] via SUBCUTANEOUS
  Administered 2021-07-03 – 2021-07-04 (×2): 3 [IU] via SUBCUTANEOUS
  Administered 2021-07-05 – 2021-07-08 (×4): 2 [IU] via SUBCUTANEOUS

## 2021-07-03 MED ORDER — ENSURE ENLIVE PO LIQD
237.0000 mL | Freq: Two times a day (BID) | ORAL | Status: DC
  Administered 2021-07-03 – 2021-07-04 (×2): 237 mL via ORAL

## 2021-07-03 MED ORDER — BETHANECHOL CHLORIDE 10 MG PO TABS
10.0000 mg | ORAL_TABLET | Freq: Three times a day (TID) | ORAL | Status: DC
  Administered 2021-07-03 – 2021-07-04 (×3): 10 mg via ORAL
  Filled 2021-07-03 (×3): qty 1

## 2021-07-03 MED ORDER — POTASSIUM CHLORIDE CRYS ER 20 MEQ PO TBCR
40.0000 meq | EXTENDED_RELEASE_TABLET | Freq: Every day | ORAL | Status: DC
  Administered 2021-07-03: 40 meq via ORAL
  Filled 2021-07-03: qty 2

## 2021-07-03 MED ORDER — ADULT MULTIVITAMIN W/MINERALS CH
1.0000 | ORAL_TABLET | Freq: Every day | ORAL | Status: DC
  Administered 2021-07-03 – 2021-07-08 (×6): 1 via ORAL
  Filled 2021-07-03 (×7): qty 1

## 2021-07-03 NOTE — [place] FL2 (Signed)
[place] MEDICAID FL2 LEVEL OF CARE SCREENING TOOL     IDENTIFICATION  Patient Name: Donna Patrick Birthdate: May 08, 1951 Sex: female Admission Date (Current Location): 07/01/2021  [facility] and [place] Number:  Herbalist and Address:  [facility] [place], [place] [address], [place], [place] [address]      Provider Number: [id number]  Attending Physician Name and Address:  Nita Sells, MD  Relative Name and Phone Number:  [name] [phone]    Current Level of Care: Hospital Recommended Level of Care: [place] Prior Approval Number:    Date Approved/Denied:   PASRR Number:    Discharge Plan: SNF    Current Diagnoses: Patient Active Problem List   Diagnosis Date Noted   Acute hypoxemic respiratory failure ([place])    Malnutrition of moderate degree 06/28/2021   Cardiac arrest ([place]) 07/02/2021   Pressure injury of skin 07/01/2021   Agitation 01/09/2021   Hypothyroid 12/15/2020   B12 deficiency 12/15/2020   Hyperlipidemia 10/25/2020   Overactive bladder 10/25/2020   Dementia with behavioral disturbance 10/25/2020    Orientation RESPIRATION BLADDER Height & Weight     Self  Normal Continent Weight: 59.7 kg Height:  5\' 2"  (157.5 cm)  BEHAVIORAL SYMPTOMS/MOOD NEUROLOGICAL BOWEL NUTRITION STATUS     (n/a) Incontinent    AMBULATORY STATUS COMMUNICATION OF NEEDS Skin   Extensive Assist (2 plus max assist) Verbally Other (Comment), PU Stage and Appropriate Care (pressure ulcer stage I coccyx medial & skin tear right upper arm) PU Stage 1 Dressing: No Dressing                     Personal Care Assistance Level of Assistance  Bathing, Feeding, Dressing Bathing Assistance: Limited assistance Feeding assistance: Independent (after set up) Dressing Assistance: Limited assistance     Functional Limitations Info  Sight, Hearing, Speech Sight Info: Adequate Hearing Info: Adequate Speech Info: Adequate    SPECIAL  CARE FACTORS FREQUENCY  PT (By licensed PT), OT (By licensed OT)     PT Frequency: 5X/wk OT Frequency: 5X/wk            Contractures Contractures Info: Not present    Additional Factors Info  Code Status, Allergies, Psychotropic, Isolation Precautions, Suctioning Needs Code Status Info: DNR Allergies Info: No known drug allergies Psychotropic Info: see d/c summary   Isolation Precautions Info: none Suctioning Needs: n/a   Current Medications (07/03/2021):  This is the current hospital active medication list Current Facility-Administered Medications  Medication Dose Route Frequency Provider Last Rate Last Admin   0.9 %  sodium chloride infusion  250 mL Intravenous Continuous Omar Person, NP   Stopped at 07/18/2021 1929   acetaminophen (TYLENOL) tablet 650 mg  650 mg Oral Q4H Omar Person, NP   650 mg at 07/02/21 1640   bethanechol (URECHOLINE) tablet 10 mg  10 mg Oral TID Nita Sells, MD       Chlorhexidine Gluconate Cloth 2 % PADS 6 each  6 each Topical Daily Jacky Kindle, MD   6 each at 07/02/21 2238   docusate (COLACE) 50 MG/5ML liquid 100 mg  100 mg Oral BID Mannam, Praveen, MD   100 mg at 07/03/21 1240   donepezil (ARICEPT) tablet 5 mg  5 mg Oral QHS Nita Sells, MD   5 mg at 07/02/21 2239   feeding supplement (ENSURE ENLIVE / ENSURE PLUS) liquid 237 mL  237 mL Oral BID BM Nita Sells, MD  237 mL at 07/03/21 1353   heparin injection 5,000 Units  5,000 Units Subcutaneous Q8H Tobey Grim, NP   5,000 Units at 07/03/21 1353   hydrALAZINE (APRESOLINE) injection 10 mg  10 mg Intravenous Q4H PRN Mannam, Praveen, MD   10 mg at 07/03/21 1128   insulin aspart (novoLOG) injection 0-15 Units  0-15 Units Subcutaneous TID WC & HS Rhetta Mura, MD   2 Units at 07/03/21 1240   levothyroxine (SYNTHROID) tablet 100 mcg  100 mcg Oral Daily Mannam, Praveen, MD   100 mcg at 07/03/21 0609   memantine (NAMENDA) tablet 5 mg  5 mg Oral BID  Rhetta Mura, MD   5 mg at 07/03/21 1130   metoprolol succinate (TOPROL-XL) 24 hr tablet 12.5 mg  12.5 mg Oral Daily Rhetta Mura, MD   12.5 mg at 07/03/21 1129   multivitamin with minerals tablet 1 tablet  1 tablet Oral Daily Rhetta Mura, MD   1 tablet at 07/03/21 1355   ondansetron (ZOFRAN) injection 4 mg  4 mg Intravenous Q6H PRN Tobey Grim, NP   4 mg at 06/29/21 1240   polyethylene glycol (MIRALAX / GLYCOLAX) packet 17 g  17 g Oral Daily Mannam, Praveen, MD   17 g at 07/02/21 1035   potassium chloride SA (KLOR-CON) CR tablet 40 mEq  40 mEq Oral Daily Rhetta Mura, MD   40 mEq at 07/03/21 1130     Discharge Medications: Please see discharge summary for a list of discharge medications.  Relevant Imaging Results:  Relevant Lab Results:   Additional Information SS# [id number] pfizer vaccine 11/18/19, 12/18/19, 06/19/20, Booster 07/02/21  Beckie Busing, RN

## 2021-07-03 NOTE — Progress Notes (Addendum)
Nutrition Follow-up  DOCUMENTATION CODES:   Non-severe (moderate) malnutrition in context of chronic illness  INTERVENTION:   Add Ensure Enlive po BID, each supplement provides 350 kcal and 20 grams of protein Add MVI with minerals daily  NUTRITION DIAGNOSIS:   Moderate Malnutrition related to chronic illness (Alzheimer's dementia) as evidenced by mild fat depletion, mild muscle depletion.  Ongoing  GOAL:   Patient will meet greater than or equal to 90% of their needs  Progressing  MONITOR:   PO intake, Supplement acceptance, Labs  REASON FOR ASSESSMENT:   Ventilator, Consult Enteral/tube feeding initiation and management  ASSESSMENT:   70 yo female admitted S/P cardiac arrest. PMH includes Alzheimer's dementia, hypothyroidism, HLD.  Discussed patient in ICU rounds and with RN today. Extubated 11/12. Diet was advanced to dysphagia 1 with thin liquids 11/13 by SLP. SLP followed up today; plans to advance diet to Regular with thin liquids since family brought in her dentures.  Patient ate 50% of dysphagia 1 meal this morning. Yesterday, intake was 25% of all meals. Hopefully intake will improve with diet advancement to Regular.   Labs reviewed. K 3 CBG: [phone]  Medications reviewed and include Colace, Novolog, Miralax, Klor-Con.   Weight down to 59.7 kg today Admission weight 67.6 kg   I/O -7.4 L since admission  Diet Order:   Diet Order             DIET - DYS 1 Room service appropriate? Yes; Fluid consistency: Thin  Diet effective now                   EDUCATION NEEDS:   No education needs have been identified at this time  Skin:  Skin Assessment: Skin Integrity Issues: Skin Integrity Issues:: Stage I, Other (Comment) Stage I: coccyx Other: skin tear to R arm  Last BM:  11/14  Height:   Ht Readings from Last 1 Encounters:  Jul 08, 2021 5\' 2"  (1.575 m)    Weight:   Wt Readings from Last 1 Encounters:  07/03/21 59.7 kg    BMI:   Body mass index is 24.07 kg/m.  Estimated Nutritional Needs:   Kcal:  1500-1800  Protein:  85-100 gm  Fluid:  >/= 1.5 L    07/05/21, RD, LDN, CNSC Please refer to Amion for contact information.

## 2021-07-03 NOTE — Telephone Encounter (Signed)
Noted  

## 2021-07-03 NOTE — Progress Notes (Signed)
PROGRESS NOTE   Donna Patrick  [id number] DOB: Dec 26, 1950 DOA: 07/17/2021 PCP: Wynn Banker, MD  Brief Narrative:  70 year old white female community dwelling, moderate dementia, recent loss of daughter 3 weeks prior to admission Recent concern for urinary tract infection--son was in the process of taking her to her physician to be seen Patient developed unresponsiveness 07/03/2021 after she sustained a cardiac arrest Patient found to be in PEA arrest 10 received 15 minutes of CPR prior to ROSC  11/09 Cardiac arrest 11/09 Intubated 11/09 Cefepime 2g started 11/09 EEG concerning severe diffuse encephalopathy 11/11 Continuous EEG suggestive of moderate to severe diffuse encephalopathy, MRI without acute abnormality.  Goals of care discussion with plan for one-way extubation and transition to DNR on extubation 11/12 Extubated   Hospital-Problem based course  PEA arrest query cause Etiology unclear Keep on monitors for now and monitor trends Sinus tachycardia Probably from anxiety/mild confusion at times-increase Toprol-XL to 25 daily 12.5 Mild hypokalemia Replace orally kdur 40, check mag in am and replace as prn Probably obstructive uropathy Foley re-placed 11/5--trial voiding trial in 2 weeks. Mild underlying dementia without concern for anoxic brain injury Reorient as able--discontinue fentanyl/Dilaudid resume Aricept 5 at bedtime Namenda 10 twice daily hold Risperdal 1 mg twice daily for behaviors ,hold Ativan at this time  ?  Pneumonia Received 5 days of cefepime and vancomycin-have discontinued given lack of objective evidence supporting PNA Hypothyroidism TSH was very elevated at 25 but this is in the setting of critical illness continue levothyroxine 100 mcg daily Recheck in 3 weeks Stage 1 Coccygeal pressure ulcer Turn and follow wound care instructions   DVT prophylaxis: Lovenox Code Status: DNR Family Communication: Discussed with patient's son at the  bedside  on 11/14 jacob chamblee [phone] Disposition:  Status is: Inpatient  Remains inpatient appropriate because: Await rehab bed likely in 24-48 h       Consultants:    Procedures:   Antimicrobials: completed 11/14 , 5 dasy of Abx    Subjective:  Awake alert slight incoherent no distress No fever no chills Monosyllabic Foley had to be replaced last pm   Objective: Vitals:   07/03/21 0800 07/03/21 0900 07/03/21 1045 07/03/21 1100  BP: (!) 172/95 116/70 (!) 180/98 (!) 181/116  Pulse: (!) 108 (!) 101 (!) 105 (!) 106  Resp: 17 16 (!) 25 18  Temp:      TempSrc:      SpO2: 92% 92% 95% 96%  Weight:      Height:        Intake/Output Summary (Last 24 hours) at 07/03/2021 1252 Last data filed at 07/03/2021 0800 Gross per 24 hour  Intake 170 ml  Output 615 ml  Net -445 ml    Filed Weights   06/28/21 0500 06/29/21 0500 07/03/21 0500  Weight: 67.6 kg 67.8 kg 59.7 kg    Examination:  ncat no focal deficit--mild confusion No ict no pallor Abd soft nt nd no rebound no guard ROM intact but limited exam secondary to understanding of what is beiong asked Psych flat-anxious  Data Reviewed: personally reviewed   CBC    Component Value Date/Time   WBC 8.8 07/03/2021 0206   RBC 4.00 07/03/2021 0206   HGB 11.9 (L) 07/03/2021 0206   HCT 35.2 (L) 07/03/2021 0206   PLT 252 07/03/2021 0206   MCV 88.0 07/03/2021 0206   MCH 29.8 07/03/2021 0206   MCHC 33.8 07/03/2021 0206   RDW 13.0 07/03/2021 0206   LYMPHSABS 2.4 06/30/2021  1023   MONOABS 1.2 (H) 06/30/2021 1023   EOSABS 0.1 06/25/2021 1023   BASOSABS 0.1 06/30/2021 1023   CMP Latest Ref Rng & Units 07/03/2021 07/02/2021 07/01/2021  Glucose 70 - 99 mg/dL 98 103(H) 102(H)  BUN 8 - 23 mg/dL 11 10 6(L)  Creatinine 0.44 - 1.00 mg/dL 0.68 0.65 0.57  Sodium 135 - 145 mmol/L 135 135 133(L)  Potassium 3.5 - 5.1 mmol/L 3.0(L) 3.7 3.5  Chloride 98 - 111 mmol/L 100 99 95(L)  CO2 22 - 32 mmol/L 25 25 29   Calcium  8.9 - 10.3 mg/dL 8.8(L) 9.0 8.5(L)  Total Protein 6.5 - 8.1 g/dL 5.6(L) - -  Total Bilirubin 0.3 - 1.2 mg/dL 0.7 - -  Alkaline Phos 38 - 126 U/L 115 - -  AST 15 - 41 U/L 30 - -  ALT 0 - 44 U/L 35 - -     Radiology Studies: No results found.   Scheduled Meds:  acetaminophen  650 mg Oral Q4H   bethanechol  10 mg Oral TID   Chlorhexidine Gluconate Cloth  6 each Topical Daily   docusate  100 mg Oral BID   donepezil  5 mg Oral QHS   feeding supplement  237 mL Oral BID BM   heparin  5,000 Units Subcutaneous Q8H   insulin aspart  0-15 Units Subcutaneous TID WC & HS   levothyroxine  100 mcg Oral Daily   memantine  5 mg Oral BID   metoprolol succinate  12.5 mg Oral Daily   multivitamin with minerals  1 tablet Oral Daily   polyethylene glycol  17 g Oral Daily   potassium chloride  40 mEq Oral Daily   Continuous Infusions:  sodium chloride Stopped (07/15/2021 1929)     LOS: 6 days   Time spent: 40  Nita Sells, MD [facility] To contact the attending provider between 7A-7P or the covering provider during after hours 7P-7A, please log into the web site [email or web address] and access using universal [place] password for that web site. If you do not have the password, please call the hospital operator.  07/03/2021, 12:52 PM

## 2021-07-03 NOTE — TOC Progression Note (Addendum)
Transition of Care [facility]) - Progression Note    Patient Details  Name: Donna Patrick MRN: [id number] Date of Birth: 11/18/1950  [facility]) CM/SW Contact  Beckie Busing, RN Phone Number:[phone]  07/03/2021, 3:32 PM  Clinical Narrative:    CM spoke with son to discuss disposition planning. Son is ok with the idea of his mother going to short term rehab and gives consent for TOC to initiate bed search.   1610 FL2 complete, initial referral has been sent out via HUB. SW will follow.       Expected Discharge Plan and Services                                                 Social Determinants of Health (SDOH) Interventions    Readmission Risk Interventions No flowsheet data found.

## 2021-07-03 NOTE — Progress Notes (Signed)
PROGRESS NOTE   Donna Patrick  [id number] DOB: Apr 18, 1951 DOA: 07/16/2021 PCP: Caren Macadam, MD  Brief Narrative:  69 year old white female community dwelling, moderate dementia, recent loss of daughter 3 weeks prior to admission Recent concern for urinary tract infection--son was in the process of taking her to her physician to be seen Patient developed unresponsiveness 06/20/2021 after she sustained a cardiac arrest Patient found to be in PEA arrest 10 received 15 minutes of CPR prior to [place]  11/09 Cardiac arrest 11/09 Intubated 11/09 Cefepime 2g started 11/09 EEG concerning severe diffuse encephalopathy 11/11 Continuous EEG suggestive of moderate to severe diffuse encephalopathy, MRI without acute abnormality.  Goals of care discussion with plan for one-way extubation and transition to DNR on extubation 11/12 Extubated   Hospital-Problem based course  PEA arrest query cause Etiology unclear Keep on monitors for now and monitor trends Sinus tachycardia Probably from anxiety/mild confusion at times-increase Toprol-XL to 25 daily 12.5 Mild underlying dementia without concern for anoxic brain injury Reorient as able--discontinue fentanyl/Dilaudid resume Aricept 5 at bedtime Namenda 10 twice daily hold Risperdal 1 mg twice daily for behaviors ,hold Ativan at this time  ?  Pneumonia Received 5 days of cefepime and vancomycin-have discontinued given lack of objective evidence supporting PNA Hypothyroidism TSH was very elevated at 25 but this is in the setting of critical illness continue levothyroxine 100 mcg daily Recheck in 3 weeks Stage 1 Coccygeal pressure ulcer Turn and follow wound care instructions   DVT prophylaxis: Lovenox Code Status: DNR Family Communication: Discussed with patient's son at the bedside  on 11/14 Donna Patrick [phone] Disposition:  Status is: Inpatient  Remains inpatient appropriate because: Await rehab bed       Consultants:     Procedures:   Antimicrobials: completed 11/14 , 5 dasy of Abx    Subjective:  Awake alert slight incoherent no distress No fever no chills Monosyllabic Foley had to be replaced last pm   Objective: Vitals:   07/03/21 0800 07/03/21 0900 07/03/21 1045 07/03/21 1100  BP: (!) 172/95 116/70 (!) 180/98 (!) 181/116  Pulse: (!) 108 (!) 101 (!) 105 (!) 106  Resp: 17 16 (!) 25 18  Temp:      TempSrc:      SpO2: 92% 92% 95% 96%  Weight:      Height:        Intake/Output Summary (Last 24 hours) at 07/03/2021 1247 Last data filed at 07/03/2021 0800 Gross per 24 hour  Intake 170 ml  Output 615 ml  Net -445 ml    Filed Weights   06/28/21 0500 06/29/21 0500 07/03/21 0500  Weight: 67.6 kg 67.8 kg 59.7 kg    Examination:  ncat no focal deficit--mild confusion No ict no pallor Abd soft nt nd no rebound no guard ROM intact but limited exam secondary to understanding of what is beiong asked Psych flat-anxious  Data Reviewed: personally reviewed   CBC    Component Value Date/Time   WBC 8.8 07/03/2021 0206   RBC 4.00 07/03/2021 0206   HGB 11.9 (L) 07/03/2021 0206   HCT 35.2 (L) 07/03/2021 0206   PLT 252 07/03/2021 0206   MCV 88.0 07/03/2021 0206   MCH 29.8 07/03/2021 0206   MCHC 33.8 07/03/2021 0206   RDW 13.0 07/03/2021 0206   LYMPHSABS 2.4 07/02/2021 1023   MONOABS 1.2 (H) 06/19/2021 1023   EOSABS 0.1 07/02/2021 1023   BASOSABS 0.1 06/26/2021 1023   CMP Latest Ref Rng & Units 07/03/2021  07/02/2021 07/01/2021  Glucose 70 - 99 mg/dL 98 287(G) 811(X)  BUN 8 - 23 mg/dL 11 10 6(L)  Creatinine 0.44 - 1.00 mg/dL 7.26 2.03 5.59  Sodium 135 - 145 mmol/L 135 135 133(L)  Potassium 3.5 - 5.1 mmol/L 3.0(L) 3.7 3.5  Chloride 98 - 111 mmol/L 100 99 95(L)  CO2 22 - 32 mmol/L 25 25 29   Calcium 8.9 - 10.3 mg/dL ) 9.0 7.4(B)  Total Protein 6.5 - 8.1 g/dL 6.3(A) - -  Total Bilirubin 0.3 - 1.2 mg/dL 0.7 - -  Alkaline Phos 38 - 126 U/L 115 - -  AST 15 - 41 U/L 30 - -   ALT 0 - 44 U/L 35 - -     Radiology Studies: No results found.   Scheduled Meds:  acetaminophen  650 mg Oral Q4H   bethanechol  10 mg Oral TID   Chlorhexidine Gluconate Cloth  6 each Topical Daily   docusate  100 mg Oral BID   donepezil  5 mg Oral QHS   feeding supplement  237 mL Oral BID BM   heparin  5,000 Units Subcutaneous Q8H   insulin aspart  0-15 Units Subcutaneous TID WC & HS   levothyroxine  100 mcg Oral Daily   memantine  5 mg Oral BID   metoprolol succinate  12.5 mg Oral Daily   multivitamin with minerals  1 tablet Oral Daily   polyethylene glycol  17 g Oral Daily   potassium chloride  40 mEq Oral Daily   Continuous Infusions:  sodium chloride Stopped (July 09, 2021 1929)     LOS: 6 days   Time spent: 28  30, MD [facility] To contact the attending provider between 7A-7P or the covering provider during after hours 7P-7A, please log into the web site [email or web address] and access using universal [place] password for that web site. If you do not have the password, please call the hospital operator.  07/03/2021, 12:47 PM

## 2021-07-03 NOTE — Evaluation (Signed)
Occupational Therapy Evaluation Patient Details Name: Donna Patrick MRN: [id number] DOB: [birth year] Today's Date: 07/03/2021   History of Present Illness 70 yo admitted 11/9 after PEA arrest at home with ROSC after 15 min CPR. Intubated 11/9-11/12. Pt with encephalopathy. PMHx: Alzheimers, hypothyroidism, HLD   Clinical Impression   PTA patient was ambulating without assist, some assist for ADLs per family (son and daughter in law).  Pt has 24/7 support from daughter in law, and they report a decline over the last 2 months with increased shuffling gait and decreased interest in assisting with laundry/making her bed.  She currently requires up to total assist +2 for toileting (incontinent of bowel in bed), mod assist for washing face and total assist for dressing; max assist for bed mobility and squat pivot transfer to recliner.  She will benefit from further OT services while admitted and after dc at SNF level to optimize independence, safety with ADLs/mobility to decrease burden of care.  Will follow.      Recommendations for follow up therapy are one component of a multi-disciplinary discharge planning process, led by the attending physician.  Recommendations may be updated based on patient status, additional functional criteria and insurance authorization.   Follow Up Recommendations  Skilled nursing-short term rehab (<3 hours/day)    Assistance Recommended at Discharge Frequent or constant Supervision/Assistance  Functional Status Assessment  Patient has had a recent decline in their functional status and demonstrates the ability to make significant improvements in function in a reasonable and predictable amount of time.  Equipment Recommendations  Other (comment) (defer to next venue)    Recommendations for Other Services       Precautions / Restrictions Precautions Precautions: Fall Precaution Comments: very stiff with left inattention and right lean Restrictions Weight Bearing  Restrictions: No      Mobility Bed Mobility Overal bed mobility: Needs Assistance Bed Mobility: Supine to Sit     Supine to sit: HOB elevated;Max assist     General bed mobility comments: pt initating moving LEs towards EOB but overall requires physical assist to scoot forward and fully come to EOB    Transfers Overall transfer level: Needs assistance   Transfers: Sit to/from Stand;Bed to chair/wheelchair/BSC Sit to Stand: Max assist   Squat pivot transfers: Max assist       General transfer comment: max assist to pivot to recliner, once in recliner max assist to stand for hygiene.      Balance Overall balance assessment: Needs assistance Sitting-balance support: No upper extremity supported;Feet supported Sitting balance-Leahy Scale: Poor Sitting balance - Comments: min assist to min guard at EOB, preference to R lateral lean Postural control: Right lateral lean Standing balance support: Bilateral upper extremity supported;During functional activity Standing balance-Leahy Scale: Poor Standing balance comment: mod assist to maintain static standing                           ADL either performed or assessed with clinical judgement   ADL Overall ADL's : Needs assistance/impaired     Grooming: Moderate assistance;Wash/dry face Grooming Details (indicate cue type and reason): mod assist to fully complete washing face with hand over hand assist to initate and at times due to weakness         Upper Body Dressing : Total assistance;Sitting   Lower Body Dressing: Total assistance;Sit to/from stand   Toilet Transfer: Maximal assistance;Squat-pivot Toilet Transfer Details (indicate cue type and reason): simulated to recliner Toileting- Clothing  Manipulation and Hygiene: Total assistance;+2 for physical assistance;Sit to/from stand Toileting - Clothing Manipulation Details (indicate cue type and reason): incontient of BM     Functional mobility during ADLs:  Maximal assistance       Vision         Perception     Praxis      Pertinent Vitals/Pain Pain Assessment: Faces Faces Pain Scale: No hurt     Hand Dominance     Extremity/Trunk Assessment Upper Extremity Assessment Upper Extremity Assessment: Generalized weakness;Difficult to assess due to impaired cognition   Lower Extremity Assessment Lower Extremity Assessment: Defer to PT evaluation       Communication Communication Communication: No difficulties   Cognition Arousal/Alertness: Awake/alert Behavior During Therapy: Flat affect Overall Cognitive Status: History of cognitive impairments - at baseline                                 General Comments: pt with hx of Alzheimers. Pt oriented to self only, follows simple commands, poor awareness.     General Comments  son and daughter in law present upon entry, provided PLOF and home setup    Exercises     Shoulder Instructions      Home Living Family/patient expects to be discharged to:: Private residence Living Arrangements: Children (son and daughter in law) Available Help at Discharge: Family;Available 24 hours/day Type of Home: House Home Access: Other (comment) (threshold)     Home Layout: Two level;Able to live on main level with bedroom/bathroom;Full bath on main level Conservation officer, historic buildings on 1st floor)     Bathroom Shower/Tub: Producer, television/film/video: Standard     Home Equipment: Shower seat - built in   Additional Comments: daughter in law home 24/7      Prior Functioning/Environment Prior Level of Function : Needs assist  Cognitive Assist : ADLs (cognitive)   ADLs (Cognitive): Intermittent cues Physical Assist : Mobility (physical);ADLs (physical)   ADLs (physical): Bathing;Dressing;Toileting;IADLs Mobility Comments: family reports pt was independent walking but shuffled gait; reports decline in the last 2 months ADLs Comments: at times needing assist for ADLs, but can  complete toileting, feeding and grooming without assist; assists with laundry and making her bed at home.  Family reports decline in the last 2 months.        OT Problem List: Decreased strength;Decreased activity tolerance;Impaired balance (sitting and/or standing);Decreased knowledge of precautions;Decreased knowledge of use of DME or AE;Decreased safety awareness;Decreased cognition      OT Treatment/Interventions: Self-care/ADL training;Therapeutic exercise;DME and/or AE instruction;Therapeutic activities;Patient/family education;Balance training    OT Goals(Current goals can be found in the care plan section) Acute Rehab OT Goals Patient Stated Goal: none stated OT Goal Formulation: Patient unable to participate in goal setting Time For Goal Achievement: 07/17/21 Potential to Achieve Goals: Good  OT Frequency: Min 2X/week   Barriers to D/C:            Co-evaluation              AM-PAC OT "6 Clicks" Daily Activity     Outcome Measure Help from another person eating meals?: A Lot Help from another person taking care of personal grooming?: A Lot Help from another person toileting, which includes using toliet, bedpan, or urinal?: Total Help from another person bathing (including washing, rinsing, drying)?: Total Help from another person to put on and taking off regular upper body clothing?: Total Help from  another person to put on and taking off regular lower body clothing?: Total 6 Click Score: 8   End of Session Equipment Utilized During Treatment: Gait belt Nurse Communication: Mobility status  Activity Tolerance: Patient tolerated treatment well Patient left: in chair;with call bell/phone within reach;with chair alarm set;with nursing/sitter in room;with restraints reapplied  OT Visit Diagnosis: Other abnormalities of gait and mobility (R26.89);Muscle weakness (generalized) (M62.81)                Time: 1062-6948 OT Time Calculation (min): 34 min Charges:  OT  General Charges $OT Visit: 1 Visit OT Evaluation $OT Eval Moderate Complexity: 1 Mod OT Treatments $Self Care/Home Management : 8-22 mins  Barry Brunner, OT Acute Rehabilitation Services Pager [phone] Office [phone]   Chancy Milroy 07/03/2021, 10:44 AM

## 2021-07-03 NOTE — Progress Notes (Signed)
Speech Language Pathology Treatment: Dysphagia  Patient Details Name: Donna Patrick MRN: [id number] DOB: 11-05-1950 Today's Date: 07/03/2021 Time: 1000-1011 SLP Time Calculation (min) (ACUTE ONLY): 11 min  Assessment / Plan / Recommendation Clinical Impression  Pt up in chair but confused, requires verbal and tactile cueing to eat. Once dentures in place and hand over hand assisted feeding provided pt able to masticate regular solids and drink liquids. Will upgrade to regular foods and thin liquids and sign off.   HPI HPI: Pt is a 70 year old female with who presented to the ED on 11/9  after witnessed cardiac arrest at home by her son. Pt found to be in wide PEA by EMS. CPR was given for 15 minutes, she received 1 round of epi, and ultimately achieved ROSC. On arrival to ED she had spontaneous breathing but minimal neurologic functioning and was intubated. CT head negative. ETT 11/9-11/12. PMH: Alzheimer's, hypothyroidism, HLD      SLP Plan  All goals met      Recommendations for follow up therapy are one component of a multi-disciplinary discharge planning process, led by the attending physician.  Recommendations may be updated based on patient status, additional functional criteria and insurance authorization.    Recommendations  Diet recommendations: Regular;Thin liquid Liquids provided via: Cup;Straw Medication Administration: Crushed with puree Supervision: Staff to assist with self feeding Compensations: Slow rate;Small sips/bites;Minimize environmental distractions Postural Changes and/or Swallow Maneuvers: Seated upright 90 degrees                Oral Care Recommendations: Oral care BID;Staff/trained caregiver to provide oral care Follow Up Recommendations: No SLP follow up Assistance recommended at discharge: Frequent or constant Supervision/Assistance SLP Visit Diagnosis: Dysphagia, unspecified (R13.10) Plan: All goals met       GO                Maryruth Apple,  Katherene Ponto  07/03/2021, 10:18 AM

## 2021-07-04 ENCOUNTER — Inpatient Hospital Stay (HOSPITAL_COMMUNITY): Payer: Medicare Other

## 2021-07-04 DIAGNOSIS — I469 Cardiac arrest, cause unspecified: Secondary | ICD-10-CM | POA: Diagnosis not present

## 2021-07-04 LAB — COMPREHENSIVE METABOLIC PANEL
ALT: 29 U/L (ref 0–44)
AST: 22 U/L (ref 15–41)
Albumin: 3.1 g/dL — ABNORMAL LOW (ref 3.5–5.0)
Alkaline Phosphatase: 113 U/L (ref 38–126)
Anion gap: 9 (ref 5–15)
BUN: 12 mg/dL (ref 8–23)
CO2: 25 mmol/L (ref 22–32)
Calcium: 8.8 mg/dL — ABNORMAL LOW (ref 8.9–10.3)
Chloride: 99 mmol/L (ref 98–111)
Creatinine, Ser: 0.67 mg/dL (ref 0.44–1.00)
GFR, Estimated: >60 mL/min (ref >60)
Glucose, Bld: 117 mg/dL — ABNORMAL HIGH (ref 70–99)
Potassium: 3.3 mmol/L — ABNORMAL LOW (ref 3.5–5.1)
Sodium: 133 mmol/L — ABNORMAL LOW (ref 135–145)
Total Bilirubin: 0.4 mg/dL (ref 0.3–1.2)
Total Protein: 5.8 g/dL — ABNORMAL LOW (ref 6.5–8.1)

## 2021-07-04 LAB — GLUCOSE, CAPILLARY
Glucose-Capillary: 100 mg/dL — ABNORMAL HIGH (ref 70–99)
Glucose-Capillary: 116 mg/dL — ABNORMAL HIGH (ref 70–99)
Glucose-Capillary: 145 mg/dL — ABNORMAL HIGH (ref 70–99)
Glucose-Capillary: 157 mg/dL — ABNORMAL HIGH (ref 70–99)
Glucose-Capillary: 119 mg/dL — ABNORMAL HIGH (ref 70–99)
Glucose-Capillary: 116 mg/dL — ABNORMAL HIGH (ref 70–99)
Glucose-Capillary: 105 mg/dL — ABNORMAL HIGH (ref 70–99)

## 2021-07-04 LAB — MAGNESIUM: Magnesium: 2.2 mg/dL (ref 1.7–2.4)

## 2021-07-04 LAB — SARS CORONAVIRUS 2 (TAT 6-24 HRS): SARS Coronavirus 2: NEGATIVE

## 2021-07-04 LAB — AMMONIA: Ammonia: 18 umol/L (ref 9–35)

## 2021-07-04 LAB — VITAMIN B12: Vitamin B-12: 2421 pg/mL — ABNORMAL HIGH (ref 180–914)

## 2021-07-04 LAB — T4, FREE: Free T4: 1.1 ng/dL (ref 0.61–1.12)

## 2021-07-04 LAB — TSH: TSH: 29.616 u[IU]/mL — ABNORMAL HIGH (ref 0.350–4.500)

## 2021-07-04 MED ORDER — AMLODIPINE BESYLATE 5 MG PO TABS
5.0000 mg | ORAL_TABLET | Freq: Every day | ORAL | Status: DC
Start: 2021-07-04 — End: 2021-07-10
  Administered 2021-07-04 – 2021-07-09 (×6): 5 mg via ORAL
  Filled 2021-07-04 (×6): qty 1

## 2021-07-04 MED ORDER — LEVOTHYROXINE SODIUM 25 MCG PO TABS
125.0000 ug | ORAL_TABLET | Freq: Every day | ORAL | Status: DC
  Administered 2021-07-05: 06:00:00 125 ug via ORAL
  Filled 2021-07-04: qty 1

## 2021-07-04 MED ORDER — POTASSIUM CHLORIDE 20 MEQ PO PACK
40.0000 meq | PACK | Freq: Every day | ORAL | Status: DC
  Administered 2021-07-04 – 2021-07-09 (×6): 40 meq via ORAL
  Filled 2021-07-04 (×6): qty 2

## 2021-07-04 MED ORDER — POTASSIUM CHLORIDE 20 MEQ PO PACK
20.0000 meq | PACK | Freq: Once | ORAL | Status: AC
  Administered 2021-07-04: 20 meq via ORAL
  Filled 2021-07-04: qty 1

## 2021-07-04 MED ORDER — POTASSIUM CHLORIDE CRYS ER 20 MEQ PO TBCR: 20.0000 meq | EXTENDED_RELEASE_TABLET | Freq: Once | ORAL | Status: DC

## 2021-07-04 NOTE — TOC Progression Note (Signed)
Transition of Care [facility]) - Progression Note    Patient Details  Name: Donna Patrick MRN: [id number] Date of Birth: 1951-07-02  [facility] CM/SW Contact  Joanne Chars, LCSW Phone Number: 07/04/2021, 10:56 AM  Clinical Narrative:   CSW met with pt son in pt room and provided [email or web address] choice document along with current bed offers.  He will review and make a decision.           Expected Discharge Plan and Services                                                 Social Determinants of Health (SDOH) Interventions    Readmission Risk Interventions No flowsheet data found.

## 2021-07-04 NOTE — Plan of Care (Signed)

## 2021-07-04 NOTE — Progress Notes (Signed)
PROGRESS NOTE    Donna Patrick  [id number] DOB: [birth year] DOA: 07/03/2021 PCP: Caren Macadam, MD   Brief Narrative: 49.  With past medical history significant for moderate dementia, recent loss of her daughter 3 weeks prior to admission,  patient became unresponsive 06/26/2021, she sustained a cardiac arrest, wide PEA.  Patient received CPR for 15 minutes and 1 round of epi.  She ultimately had ROSC.  On arrival to the ED she had a spontaneous breathing but minimal neurological function and was intubated.  CT head was negative for acute process.  Hospital events;  11/09; admitted for cardiac arrest, intubated in the ED 11/09; EEG concerning severe diffuse encephalopathy 11/11; continuous EEG suggestive of moderate to severe diffuse encephalopathy, MRI without acute abnormality.  Goals of care discussion with plan for one-way extubation and transition to DNR on extubation. 11/12; extubated    Assessment & Plan:   Active Problems:   Cardiac arrest ([place])   Pressure injury of skin   Malnutrition of moderate degree   Acute hypoxemic respiratory failure ([place])   1-PEA, Cardia Arrest;  -Unclear etiology.  -ECHO: Ejection fraction 55 to [id number], diastolic dysfunction grade 1. -Mild elevation of troponin -No significant electrolytes abnormality on admission -Cardiology has been consulted, for further recommendations.   Hypokalemia: Replace orally  Obstructive uropathy, probably: Foley catheter placed 11/14.  She will need voiding trial in 2 weeks. -hold bethanechol.  -she will need voiding trial.   Acute metabolic encephalopathy, Underline Mild dementia Anoxic brain injury.  -MRI 11/11: Negative for acute infarct, there is moderate atrophy.  Periventricular white matter hyperintensity may represent chronic ischemia. -EEG; study suggestive of moderate to severe diffuse encephalopathy, nonspecific etiology but could be secondary to sedation, anoxic hypoxic brain injury.  No  seizure or definite epileptiform discharge were seen throughout the recording.  -Son patient appears more sleepy today, more confused, and her speech is different.  Went to proceed with repeat CT head, check ammonia level, repeat TSH, B12 level. Suspect  Delirium.  HTN;  Sinus tachycardia; started on Toprol.  Plan to add Norvasc today.   Possible aspiration pneumonia: Received 5 days of cefepime and vancomycin.  Hypothyroidism: TSH on admission at 25.  Continue with Synthroid. Plan to recheck thyroid function test today.  Stage I coccygeal pressure ulcer: Follow Wound care recommendation.  Transaminases: Abdominal ultrasound 11/9: Layering gallbladder sludge, without associated sonographic findings to suggest acute cholecystitis.  Otherwise negative abdominal ultrasound. -resolved.   Moderate Malnutrition  On supplement.   Pressure Injury 07/15/2021 Coccyx Medial Stage 1 -  Intact skin with non-blanchable redness of a localized area usually over a bony prominence. (Active)  06/29/2021 1641  Location: Coccyx  Location Orientation: Medial  Staging: Stage 1 -  Intact skin with non-blanchable redness of a localized area usually over a bony prominence.  Wound Description (Comments):   Present on Admission: Yes     Nutrition Problem: Moderate Malnutrition Etiology: chronic illness (Alzheimer's dementia)    Signs/Symptoms: mild fat depletion, mild muscle depletion    Interventions: Ensure Enlive (each supplement provides 350kcal and 20 grams of protein)  Estimated body mass index is 24.76 kg/m as calculated from the following:   Height as of this encounter: 5\' 2"  (1.575 m).   Weight as of this encounter: 61.4 kg.   DVT prophylaxis: Heparin Code Status: DNR Family Communication: Son who was at bedside Disposition Plan:  Status is: Inpatient  Remains inpatient appropriate because: Patient with probably delirium currently, plan for rehab  when bed available, also cardiology  consultation        Consultants:  CCM admitted patient EGD 11/16  Procedures:  Echo: Normal ejection fraction  Antimicrobials:    Subjective: Patient is a sleepy, say a few words very slowly and very weak.  She was able to follow some commands,.  Generalized weak  Objective: Vitals:   07/04/21 0400 07/04/21 0500 07/04/21 0600 07/04/21 0740  BP: (!) 164/85  (!) 156/77   Pulse: 67  91   Resp: 11  16   Temp:    98 F (36.7 C)  TempSrc:    Axillary  SpO2: 97%  93%   Weight:  61.4 kg    Height:        Intake/Output Summary (Last 24 hours) at 07/04/2021 0743 Last data filed at 07/04/2021 0400 Gross per 24 hour  Intake 420 ml  Output 580 ml  Net -160 ml   Filed Weights   06/29/21 0500 07/03/21 0500 07/04/21 0500  Weight: 67.8 kg 59.7 kg 61.4 kg    Examination:  General exam: Appears calm and comfortable  Respiratory system: Clear to auscultation. Respiratory effort normal. Cardiovascular system: S1 & S2 heard, RRR. No JVD, murmurs, rubs, gallops or clicks. No pedal edema. Gastrointestinal system: Abdomen is nondistended, soft and nontender. No organomegaly or masses felt. Normal bowel sounds heard. Central nervous system: She was awake, generalized weakness, upper and lower extremity, speech very soft and weak, difficult to understand at times. Extremities: No edema   Data Reviewed: I have personally reviewed following labs and imaging studies  CBC: Recent Labs  Lab 07/18/2021 1023 07/15/2021 1034 06/25/2021 1512 06/28/21 0452 06/29/21 0355 07/01/21 0311 07/03/21 0206  WBC 20.9*  --   --  10.8* 7.7 7.7 8.8  NEUTROABS 16.5*  --   --   --   --   --   --   HGB 13.2   < > 10.9* 13.2 11.8* 11.4* 11.9*  HCT 38.8   < > 32.0* 40.0 34.5* 33.1* 35.2*  MCV 88.6  --   --  89.5 89.1 86.2 88.0  PLT 183  --   --  147* PLATELET CLUMPS NOTED ON SMEAR, COUNT APPEARS DECREASED 147* 252   < > = values in this interval not displayed.   Basic Metabolic Panel: Recent Labs   Lab 06/28/21 0452 06/29/21 0355 07/01/21 0311 07/02/21 0232 07/03/21 0206 07/04/21 0207  NA 137 137 133* 135 135 133*  K 5.0 3.6 3.5 3.7 3.0* 3.3*  CL 104 105 95* 99 100 99  CO2 23 24 29 25 25 25   GLUCOSE 118* 117* 102* 103* 98 117*  BUN 18 13 6* 10 11 12   CREATININE 0.90 0.70 0.57 0.65 0.68 0.67  CALCIUM 8.3* 8.0* 8.5* 9.0 8.8* 8.8*  MG 2.5* 2.1 2.2  --   --  2.2  PHOS 2.9 2.2* 2.8  --   --   --    GFR: Estimated Creatinine Clearance: 58 mL/min (by C-G formula based on SCr of 0.67 mg/dL). Liver Function Tests: Recent Labs  Lab 07/15/2021 1023 06/28/21 0452 07/03/21 0206 07/04/21 0207  AST 108* 57* 30 22  ALT 77* 64* 35 29  ALKPHOS 90 85 115 113  BILITOT 1.7* 0.7 0.7 0.4  PROT 6.3* 5.9* 5.6* 5.8*  ALBUMIN 3.4* 3.0* 3.0* 3.1*   No results for input(s): LIPASE, AMYLASE in the last 168 hours. No results for input(s): AMMONIA in the last 168 hours. Coagulation Profile: No results for  input(s): INR, PROTIME in the last 168 hours. Cardiac Enzymes: No results for input(s): CKTOTAL, CKMB, CKMBINDEX, TROPONINI in the last 168 hours. BNP (last 3 results) No results for input(s): PROBNP in the last 8760 hours. HbA1C: No results for input(s): HGBA1C in the last 72 hours. CBG: Recent Labs  Lab 07/03/21 0730 07/03/21 1123 07/03/21 1804 07/03/21 2132 07/04/21 0729  GLUCAP 106* 143* 174* 113* 100*   Lipid Profile: No results for input(s): CHOL, HDL, LDLCALC, TRIG, CHOLHDL, LDLDIRECT in the last 72 hours. Thyroid Function Tests: No results for input(s): TSH, T4TOTAL, FREET4, T3FREE, THYROIDAB in the last 72 hours. Anemia Panel: No results for input(s): VITAMINB12, FOLATE, FERRITIN, TIBC, IRON, RETICCTPCT in the last 72 hours. Sepsis Labs: Recent Labs  Lab 07/14/2021 1636 06/30/2021 2232 06/28/21 0451 06/29/21 2042  LATICACIDVEN 3.6* 4.3* 3.3* 1.0    Recent Results (from the past 240 hour(s))  Culture, blood (routine x 2)     Status: None   Collection Time: 07/16/2021  10:23 AM   Specimen: BLOOD  Result Value Ref Range Status   Specimen Description BLOOD LEFT ANTECUBITAL  Final   Special Requests   Final    BOTTLES DRAWN AEROBIC AND ANAEROBIC Blood Culture adequate volume   Culture   Final    NO GROWTH 5 DAYS Performed at [facility], [address] [place], [place] [address]    Report Status 07/02/2021 FINAL  Final  Resp Panel by RT-PCR (Flu A&B, Covid) Nasopharyngeal Swab     Status: None   Collection Time: 07/09/2021 10:36 AM   Specimen: Nasopharyngeal Swab; Nasopharyngeal(NP) swabs in vial transport medium  Result Value Ref Range Status   SARS Coronavirus 2 by RT PCR NEGATIVE NEGATIVE Final    Comment: (NOTE) SARS-CoV-2 target nucleic acids are NOT DETECTED.  The SARS-CoV-2 RNA is generally detectable in upper respiratory specimens during the acute phase of infection. The lowest concentration of SARS-CoV-2 viral copies this assay can detect is 138 copies/mL. A negative result does not preclude SARS-Cov-2 infection and should not be used as the sole basis for treatment or other patient management decisions. A negative result may occur with  improper specimen collection/handling, submission of specimen other than nasopharyngeal swab, presence of viral mutation(s) within the areas targeted by this assay, and inadequate number of viral copies(<138 copies/mL). A negative result must be combined with clinical observations, patient history, and epidemiological information. The expected result is Negative.  Fact Sheet for Patients:  EntrepreneurPulse.com.au  Fact Sheet for Healthcare Providers:  IncredibleEmployment.be  This test is no t yet approved or cleared by the [place] FDA and  has been authorized for detection and/or diagnosis of SARS-CoV-2 by FDA under an Emergency Use Authorization (EUA). This EUA will remain  in effect (meaning this test can be used) for the duration of the COVID-19  declaration under Section 564(b)(1) of the Act, 21 U.S.C.section 360bbb-3(b)(1), unless the authorization is terminated  or revoked sooner.       Influenza A by PCR NEGATIVE NEGATIVE Final   Influenza B by PCR NEGATIVE NEGATIVE Final    Comment: (NOTE) The Xpert Xpress SARS-CoV-2/FLU/RSV plus assay is intended as an aid in the diagnosis of influenza from Nasopharyngeal swab specimens and should not be used as a sole basis for treatment. Nasal washings and aspirates are unacceptable for Xpert Xpress SARS-CoV-2/FLU/RSV testing.  Fact Sheet for Patients: EntrepreneurPulse.com.au  Fact Sheet for Healthcare Providers: IncredibleEmployment.be  This test is not yet approved or cleared by the [place]  States FDA and has been authorized for detection and/or diagnosis of SARS-CoV-2 by FDA under an Emergency Use Authorization (EUA). This EUA will remain in effect (meaning this test can be used) for the duration of the COVID-19 declaration under Section 564(b)(1) of the Act, 21 U.S.C. section 360bbb-3(b)(1), unless the authorization is terminated or revoked.  Performed at [facility], [place] [address], [place], [place] [address]   Culture, blood (routine x 2)     Status: None   Collection Time: 06/26/2021 11:02 AM   Specimen: BLOOD RIGHT HAND  Result Value Ref Range Status   Specimen Description BLOOD RIGHT HAND  Final   Special Requests   Final    BOTTLES DRAWN AEROBIC AND ANAEROBIC Blood Culture results may not be optimal due to an inadequate volume of blood received in culture bottles   Culture   Final    NO GROWTH 5 DAYS Performed at [facility], [place] [address], [place], [place] [address]    Report Status 07/02/2021 FINAL  Final  Respiratory (~20 pathogens) panel by PCR     Status: None   Collection Time: 06/25/2021  4:10 PM   Specimen: Nasopharyngeal Swab; Respiratory  Result Value Ref Range Status   Adenovirus NOT DETECTED NOT  DETECTED Final   Coronavirus 229E NOT DETECTED NOT DETECTED Final    Comment: (NOTE) The Coronavirus on the Respiratory Panel, DOES NOT test for the novel  Coronavirus (2019 nCoV)    Coronavirus HKU1 NOT DETECTED NOT DETECTED Final   Coronavirus NL63 NOT DETECTED NOT DETECTED Final   Coronavirus OC43 NOT DETECTED NOT DETECTED Final   Metapneumovirus NOT DETECTED NOT DETECTED Final   Rhinovirus / Enterovirus NOT DETECTED NOT DETECTED Final   Influenza A NOT DETECTED NOT DETECTED Final   Influenza B NOT DETECTED NOT DETECTED Final   Parainfluenza Virus 1 NOT DETECTED NOT DETECTED Final   Parainfluenza Virus 2 NOT DETECTED NOT DETECTED Final   Parainfluenza Virus 3 NOT DETECTED NOT DETECTED Final   Parainfluenza Virus 4 NOT DETECTED NOT DETECTED Final   Respiratory Syncytial Virus NOT DETECTED NOT DETECTED Final   Bordetella pertussis NOT DETECTED NOT DETECTED Final   Bordetella Parapertussis NOT DETECTED NOT DETECTED Final   Chlamydophila pneumoniae NOT DETECTED NOT DETECTED Final   Mycoplasma pneumoniae NOT DETECTED NOT DETECTED Final    Comment: Performed at [facility], [place] [address] [place], [place] [address]  MRSA Next Gen by PCR, Nasal     Status: Abnormal   Collection Time: 06/28/21 11:41 AM   Specimen: Nasal Mucosa; Nasal Swab  Result Value Ref Range Status   MRSA by PCR Next Gen DETECTED (A) NOT DETECTED Final    Comment: RESULT CALLED TO, READ BACK BY AND VERIFIED WITH: Annetta Maw RN, AT 06/28/21 D. Victoriano Lain (NOTE) The GeneXpert MRSA Assay (FDA approved for NASAL specimens only), is one component of a comprehensive MRSA colonization surveillance program. It is not intended to diagnose MRSA infection nor to guide or monitor treatment for MRSA infections. Test performance is not FDA approved in patients less than 103 years old. Performed at [facility], [place] [address], [place], [place] [address]   SARS CORONAVIRUS 2 (TAT 6-24 HRS) Nasopharyngeal Nasopharyngeal  Swab     Status: None   Collection Time: 07/03/21  4:43 PM   Specimen: Nasopharyngeal Swab  Result Value Ref Range Status   SARS Coronavirus 2 NEGATIVE NEGATIVE Final    Comment: (NOTE) SARS-CoV-2 target nucleic  acids are NOT DETECTED.  The SARS-CoV-2 RNA is generally detectable in upper and lower respiratory specimens during the acute phase of infection. Negative results do not preclude SARS-CoV-2 infection, do not rule out co-infections with other pathogens, and should not be used as the sole basis for treatment or other patient management decisions. Negative results must be combined with clinical observations, patient history, and epidemiological information. The expected result is Negative.  Fact Sheet for Patients: HairSlick.no  Fact Sheet for Healthcare Providers: quierodirigir.com  This test is not yet approved or cleared by the [place] FDA and  has been authorized for detection and/or diagnosis of SARS-CoV-2 by FDA under an Emergency Use Authorization (EUA). This EUA will remain  in effect (meaning this test can be used) for the duration of the COVID-19 declaration under Se ction 564(b)(1) of the Act, 21 U.S.C. section 360bbb-3(b)(1), unless the authorization is terminated or revoked sooner.  Performed at [facility], [address], [place], [place] [address]          Radiology Studies: No results found.      Scheduled Meds:  acetaminophen  650 mg Oral Q4H   bethanechol  10 mg Oral TID   Chlorhexidine Gluconate Cloth  6 each Topical Daily   docusate  100 mg Oral BID   donepezil  5 mg Oral QHS   feeding supplement  237 mL Oral BID BM   heparin  5,000 Units Subcutaneous Q8H   insulin aspart  0-15 Units Subcutaneous TID WC & HS   levothyroxine  100 mcg Oral Daily   memantine  5 mg Oral BID   metoprolol succinate  12.5 mg Oral Daily   multivitamin with minerals  1 tablet Oral Daily    polyethylene glycol  17 g Oral Daily   potassium chloride  40 mEq Oral Daily   Continuous Infusions:  sodium chloride Stopped (July 10, 2021 1929)     LOS: 7 days    Time spent: 35 minutes.     Alba Cory, MD [facility]   If 7PM-7AM, please contact night-coverage [email or web address]  07/04/2021, 7:43 AM

## 2021-07-04 NOTE — Consult Note (Signed)
Reason for Consult:PEA cardiac arrest Referring Physician: primary hospitalist  Donna Patrick is an 70 y.o. female.  Donna Patrick is 70 year old female with past medical history is significant for progressive worsening dementia, hyperlipidemia, hypothyroidism,was admitted on 06/25/2021.   Patient suddenly became unresponsive and sustained PEA cardiac arrest.  EKG done in the ED showed no acute ischemic changes.  Troponin, however, very minimally elevated.  Patient subsequently had 2-D echo which showed normal LV systolic function and no segmental wall motion abnormalities.  Cardiologic consultation is called today for any further cardiac workup.  Patient's activity is very limited.  No meaningful history could be often due to progressive dementia.  Past Medical History:  Diagnosis Date   Dementia (HCC)    diagnosed 8-10 years ago   Hyperlipidemia    Incontinence    Thyroid disease     Past Surgical History:  Procedure Laterality Date   ABDOMINAL HYSTERECTOMY     DUPUYTREN CONTRACTURE RELEASE Left     Family History  Problem Relation Age of Onset   Dementia Mother    Cancer Father    Dementia Brother    Parkinson's disease Brother     Social History:  reports that she has quit smoking. She has been exposed to tobacco smoke. She has never used smokeless tobacco. She reports that she does not drink alcohol and does not use drugs.  Allergies: No Known Allergies  Medications: I have reviewed the patient's current medications.  Results for orders placed or performed during the hospital encounter of 06/24/2021 (from the past 48 hour(s))  Glucose, capillary     Status: Abnormal   Collection Time: 07/02/21  4:20 PM  Result Value Ref Range   Glucose-Capillary 123 (H) 70 - 99 mg/dL    Comment: Glucose reference range applies only to samples taken after fasting for at least 8 hours.  Glucose, capillary     Status: Abnormal   Collection Time: 07/02/21  7:29 PM  Result Value Ref Range    Glucose-Capillary 135 (H) 70 - 99 mg/dL    Comment: Glucose reference range applies only to samples taken after fasting for at least 8 hours.  Glucose, capillary     Status: None   Collection Time: 07/02/21 11:24 PM  Result Value Ref Range   Glucose-Capillary 94 70 - 99 mg/dL    Comment: Glucose reference range applies only to samples taken after fasting for at least 8 hours.  Comprehensive metabolic panel     Status: Abnormal   Collection Time: 07/03/21  2:06 AM  Result Value Ref Range   Sodium 135 135 - 145 mmol/L   Potassium 3.0 (L) 3.5 - 5.1 mmol/L    Comment: CORRECTED ON 11/15 AT 1400: PREVIOUSLY REPORTED AS 3.0 CRITICAL RESULT CALLED TO, READ BACK BY AND VERIFIED WITH:   Chloride 100 98 - 111 mmol/L   CO2 25 22 - 32 mmol/L   Glucose, Bld 98 70 - 99 mg/dL    Comment: Glucose reference range applies only to samples taken after fasting for at least 8 hours.   BUN 11 8 - 23 mg/dL   Creatinine, Ser 0.17 0.44 - 1.00 mg/dL   Calcium 8.8 (L) 8.9 - 10.3 mg/dL   Total Protein 5.6 (L) 6.5 - 8.1 g/dL   Albumin 3.0 (L) 3.5 - 5.0 g/dL   AST 30 15 - 41 U/L   ALT 35 0 - 44 U/L   Alkaline Phosphatase 115 38 - 126 U/L   Total Bilirubin 0.7 0.3 -  1.2 mg/dL   GFR, Estimated >54 >00 mL/min    Comment: (NOTE) Calculated using the CKD-EPI Creatinine Equation (2021)    Anion gap 10 5 - 15    Comment: Performed at [facility], [address] [place], [place] [address]  CBC     Status: Abnormal   Collection Time: 07/03/21  2:06 AM  Result Value Ref Range   WBC 8.8 4.0 - 10.5 K/uL   RBC 4.00 3.87 - 5.11 MIL/uL   Hemoglobin 11.9 (L) 12.0 - 15.0 g/dL   HCT 95.0 (L) 93.2 - 67.1 %   MCV 88.0 80.0 - 100.0 fL   MCH 29.8 26.0 - 34.0 pg   MCHC 33.8 30.0 - 36.0 g/dL   RDW 24.5 80.9 - 98.3 %   Platelets 252 150 - 400 K/uL   nRBC 0.0 0.0 - 0.2 %    Comment: Performed at [facility], [address], [place], [place] [address]  Glucose, capillary     Status: None   Collection Time:  07/03/21  3:18 AM  Result Value Ref Range   Glucose-Capillary 90 70 - 99 mg/dL    Comment: Glucose reference range applies only to samples taken after fasting for at least 8 hours.  Glucose, capillary     Status: Abnormal   Collection Time: 07/03/21  7:30 AM  Result Value Ref Range   Glucose-Capillary 106 (H) 70 - 99 mg/dL    Comment: Glucose reference range applies only to samples taken after fasting for at least 8 hours.  Glucose, capillary     Status: Abnormal   Collection Time: 07/03/21 11:23 AM  Result Value Ref Range   Glucose-Capillary 143 (H) 70 - 99 mg/dL    Comment: Glucose reference range applies only to samples taken after fasting for at least 8 hours.  SARS CORONAVIRUS 2 (TAT 6-24 HRS) Nasopharyngeal Nasopharyngeal Swab     Status: None   Collection Time: 07/03/21  4:43 PM   Specimen: Nasopharyngeal Swab  Result Value Ref Range   SARS Coronavirus 2 NEGATIVE NEGATIVE    Comment: (NOTE) SARS-CoV-2 target nucleic acids are NOT DETECTED.  The SARS-CoV-2 RNA is generally detectable in upper and lower respiratory specimens during the acute phase of infection. Negative results do not preclude SARS-CoV-2 infection, do not rule out co-infections with other pathogens, and should not be used as the sole basis for treatment or other patient management decisions. Negative results must be combined with clinical observations, patient history, and epidemiological information. The expected result is Negative.  Fact Sheet for Patients: HairSlick.no  Fact Sheet for Healthcare Providers: quierodirigir.com  This test is not yet approved or cleared by the [place] FDA and  has been authorized for detection and/or diagnosis of SARS-CoV-2 by FDA under an Emergency Use Authorization (EUA). This EUA will remain  in effect (meaning this test can be used) for the duration of the COVID-19 declaration under Se ction 564(b)(1) of the  Act, 21 U.S.C. section 360bbb-3(b)(1), unless the authorization is terminated or revoked sooner.  Performed at [facility], [address], [place], [place] [address]   Glucose, capillary     Status: Abnormal   Collection Time: 07/03/21  6:04 PM  Result Value Ref Range   Glucose-Capillary 174 (H) 70 - 99 mg/dL    Comment: Glucose reference range applies only to samples taken after fasting for at least 8 hours.  Glucose, capillary     Status: Abnormal   Collection  Time: 07/03/21  9:32 PM  Result Value Ref Range   Glucose-Capillary 113 (H) 70 - 99 mg/dL    Comment: Glucose reference range applies only to samples taken after fasting for at least 8 hours.  Comprehensive metabolic panel     Status: Abnormal   Collection Time: 07/04/21  2:07 AM  Result Value Ref Range   Sodium 133 (L) 135 - 145 mmol/L   Potassium 3.3 (L) 3.5 - 5.1 mmol/L   Chloride 99 98 - 111 mmol/L   CO2 25 22 - 32 mmol/L   Glucose, Bld 117 (H) 70 - 99 mg/dL    Comment: Glucose reference range applies only to samples taken after fasting for at least 8 hours.   BUN 12 8 - 23 mg/dL   Creatinine, Ser 7.86 0.44 - 1.00 mg/dL   Calcium 8.8 (L) 8.9 - 10.3 mg/dL   Total Protein 5.8 (L) 6.5 - 8.1 g/dL   Albumin 3.1 (L) 3.5 - 5.0 g/dL   AST 22 15 - 41 U/L   ALT 29 0 - 44 U/L   Alkaline Phosphatase 113 38 - 126 U/L   Total Bilirubin 0.4 0.3 - 1.2 mg/dL   GFR, Estimated >75 >44 mL/min    Comment: (NOTE) Calculated using the CKD-EPI Creatinine Equation (2021)    Anion gap 9 5 - 15    Comment: Performed at [facility], [address] [place], [place] [address]  Magnesium     Status: None   Collection Time: 07/04/21  2:07 AM  Result Value Ref Range   Magnesium 2.2 1.7 - 2.4 mg/dL    Comment: Performed at [facility], [address] [place], [place] [address]  Glucose, capillary     Status: Abnormal   Collection Time: 07/04/21  7:29 AM  Result Value Ref Range   Glucose-Capillary 100 (H) 70 - 99  mg/dL    Comment: Glucose reference range applies only to samples taken after fasting for at least 8 hours.  Vitamin B12     Status: Abnormal   Collection Time: 07/04/21 10:12 AM  Result Value Ref Range   Vitamin B-12 2,421 (H) 180 - 914 pg/mL    Comment: (NOTE) This assay is not validated for testing neonatal or myeloproliferative syndrome specimens for Vitamin B12 levels. Performed at [facility], [address], [place], [place] [address]   Ammonia     Status: None   Collection Time: 07/04/21 10:12 AM  Result Value Ref Range   Ammonia 18 9 - 35 umol/L    Comment: Performed at [facility], [address], [place], [place] [address]  T4, free     Status: None   Collection Time: 07/04/21 10:12 AM  Result Value Ref Range   Free T4 1.10 0.61 - 1.12 ng/dL    Comment: (NOTE) Biotin ingestion may interfere with free T4 tests. If the results are inconsistent with the TSH level, previous test results, or the clinical presentation, then consider biotin interference. If needed, order repeat testing after stopping biotin. Performed at [facility], [address] [place], [place] [address]   TSH     Status: Abnormal   Collection Time: 07/04/21 10:12 AM  Result Value Ref Range   TSH 29.616 (H) 0.350 - 4.500 uIU/mL    Comment: Performed by a 3rd Generation assay with a functional sensitivity of <=0.01 uIU/mL. Performed at [facility], [address], [place], [place] [address]  Glucose, capillary     Status: Abnormal   Collection Time: 07/04/21 11:50 AM  Result Value Ref Range   Glucose-Capillary 116 (H) 70 - 99 mg/dL    Comment: Glucose reference range applies only to samples taken after fasting for at least 8 hours.    CT HEAD WO CONTRAST ( )  Result Date: 07/04/2021 CLINICAL DATA:  70 year old female with history of delirium. EXAM: CT HEAD WITHOUT CONTRAST TECHNIQUE: Contiguous axial images were obtained from the base of the skull through the vertex  without intravenous contrast. COMPARISON:  Head CT 2021/07/19. FINDINGS: Brain: Moderate cerebral atrophy with ex vacuo dilatation of the ventricular system. Patchy and confluent areas of decreased attenuation are noted throughout the deep and periventricular white matter of the cerebral hemispheres bilaterally, compatible with chronic microvascular ischemic disease. No evidence of acute infarction, hemorrhage, hydrocephalus, extra-axial collection or mass lesion/mass effect. Vascular: No hyperdense vessel or unexpected calcification. Skull: Normal. Negative for fracture or focal lesion. Sinuses/Orbits: No acute finding. Other: None. IMPRESSION: 1. No acute intracranial abnormalities. 2. Moderate cerebral atrophy with extensive chronic microvascular ischemic changes in the cerebral white matter, as above. Electronically Signed   By: Trudie Reed M.D.   On: 07/04/2021 11:25    Review of Systems  Unable to perform ROS: Dementia  Blood pressure (!) 175/93, pulse 88, temperature 98.6 F (37 C), temperature source Oral, resp. rate 17, height 5\' 2"  (1.575 m), weight 61.4 kg, SpO2 98 %. Physical Exam Constitutional:      Appearance: Normal appearance.  HENT:     Head: Normocephalic and atraumatic.  Eyes:     Conjunctiva/sclera: Conjunctivae normal.     Pupils: Pupils are equal, round, and reactive to light.  Neck:     Vascular: No carotid bruit.  Cardiovascular:     Rate and Rhythm: Normal rate and regular rhythm.  Pulmonary:     Effort: Pulmonary effort is normal.     Breath sounds: Normal breath sounds.  Abdominal:     General: Abdomen is flat.     Palpations: Abdomen is soft.  Musculoskeletal:     Cervical back: Normal range of motion and neck supple.  Neurological:     General: No focal deficit present.    Assessment/Plan: Status post PEA cardiac arrest. Minimally elevated high sensitivity troponin I is secondary to body.  Doubt significant MI Status post hypoxic/hypercarbic  respiratory failure. Progressive dementia. Hypothyroidism. Hyperlipidemia PLAN Continue present management. No further cardiac workup.  In view of progressive dementia and DNR 07/04/2021, 1:01 PM

## 2021-07-05 DIAGNOSIS — N179 Acute kidney failure, unspecified: Secondary | ICD-10-CM | POA: Diagnosis not present

## 2021-07-05 DIAGNOSIS — R451 Restlessness and agitation: Secondary | ICD-10-CM | POA: Diagnosis not present

## 2021-07-05 LAB — CBC
HCT: 37.1 % (ref 36.0–46.0)
Hemoglobin: 12.5 g/dL (ref 12.0–15.0)
MCH: 30 pg (ref 26.0–34.0)
MCHC: 33.7 g/dL (ref 30.0–36.0)
MCV: 89 fL (ref 80.0–100.0)
Platelets: 340 10*3/uL (ref 150–400)
RBC: 4.17 MIL/uL (ref 3.87–5.11)
RDW: 13.8 % (ref 11.5–15.5)
WBC: 10.4 10*3/uL (ref 4.0–10.5)
nRBC: 0 % (ref 0.0–0.2)

## 2021-07-05 LAB — BASIC METABOLIC PANEL
Anion gap: 8 (ref 5–15)
BUN: 12 mg/dL (ref 8–23)
CO2: 26 mmol/L (ref 22–32)
Calcium: 8.9 mg/dL (ref 8.9–10.3)
Chloride: 102 mmol/L (ref 98–111)
Creatinine, Ser: 0.66 mg/dL (ref 0.44–1.00)
GFR, Estimated: >60 mL/min (ref >60)
Glucose, Bld: 115 mg/dL — ABNORMAL HIGH (ref 70–99)
Potassium: 3.8 mmol/L (ref 3.5–5.1)
Sodium: 136 mmol/L (ref 135–145)

## 2021-07-05 LAB — POCT I-STAT 7, (LYTES, BLD GAS, ICA,H+H)
Acid-Base Excess: 5 mmol/L — ABNORMAL HIGH (ref 0.0–2.0)
Bicarbonate: 27.5 mmol/L (ref 20.0–28.0)
Calcium, Ion: 1.23 mmol/L (ref 1.15–1.40)
HCT: 36 % (ref 36.0–46.0)
Hemoglobin: 12.2 g/dL (ref 12.0–15.0)
O2 Saturation: 97 %
Patient temperature: 98.6
Potassium: 4.7 mmol/L (ref 3.5–5.1)
Sodium: 136 mmol/L (ref 135–145)
TCO2: 29 mmol/L (ref 22–32)
pCO2 arterial: 33.2 mmHg (ref 32.0–48.0)
pH, Arterial: 7.526 — ABNORMAL HIGH (ref 7.350–7.450)
pO2, Arterial: 84 mmHg (ref 83.0–108.0)

## 2021-07-05 LAB — GLUCOSE, CAPILLARY
Glucose-Capillary: 112 mg/dL — ABNORMAL HIGH (ref 70–99)
Glucose-Capillary: 105 mg/dL — ABNORMAL HIGH (ref 70–99)
Glucose-Capillary: 113 mg/dL — ABNORMAL HIGH (ref 70–99)
Glucose-Capillary: 124 mg/dL — ABNORMAL HIGH (ref 70–99)
Glucose-Capillary: 98 mg/dL (ref 70–99)

## 2021-07-05 LAB — T3, FREE: T3, Free: 1.9 pg/mL — ABNORMAL LOW (ref 2.0–4.4)

## 2021-07-05 LAB — MAGNESIUM: Magnesium: 2.2 mg/dL (ref 1.7–2.4)

## 2021-07-05 MED ORDER — LEVOTHYROXINE SODIUM 25 MCG PO TABS: 25.0000 ug | ORAL_TABLET | Freq: Once | ORAL | Status: DC

## 2021-07-05 MED ORDER — LEVOTHYROXINE SODIUM 100 MCG PO TABS: 150.0000 ug | ORAL_TABLET | Freq: Every day | ORAL | Status: DC

## 2021-07-05 MED ORDER — MEMANTINE HCL 5 MG PO TABS: 5.0000 mg | ORAL_TABLET | Freq: Two times a day (BID) | ORAL | 0 refills | Status: AC

## 2021-07-05 MED ORDER — ENSURE ENLIVE PO LIQD
237.0000 mL | Freq: Three times a day (TID) | ORAL | Status: DC
  Administered 2021-07-05 – 2021-07-08 (×8): 237 mL via ORAL

## 2021-07-05 MED ORDER — CHLORHEXIDINE GLUCONATE CLOTH 2 % EX PADS
6.0000 | MEDICATED_PAD | Freq: Every day | CUTANEOUS | Status: DC
  Administered 2021-07-06 – 2021-07-10 (×5): 6 via TOPICAL

## 2021-07-05 MED ORDER — POTASSIUM CHLORIDE 20 MEQ PO PACK: 20.0000 meq | PACK | Freq: Every day | ORAL | 0 refills | Status: DC

## 2021-07-05 MED ORDER — ADULT MULTIVITAMIN W/MINERALS CH: 1.0000 | ORAL_TABLET | Freq: Every day | ORAL | 0 refills | Status: AC

## 2021-07-05 MED ORDER — POLYETHYLENE GLYCOL 3350 17 G PO PACK: 17.0000 g | PACK | Freq: Every day | ORAL | 0 refills | Status: DC

## 2021-07-05 MED ORDER — SODIUM CHLORIDE 0.9 % IV SOLN: INTRAVENOUS | Status: DC

## 2021-07-05 MED ORDER — LEVOTHYROXINE SODIUM 125 MCG PO TABS: 125.0000 ug | ORAL_TABLET | Freq: Every day | ORAL | 0 refills | Status: AC

## 2021-07-05 MED ORDER — METOPROLOL SUCCINATE ER 25 MG PO TB24
12.5000 mg | ORAL_TABLET | Freq: Every day | ORAL | 3 refills | Status: DC
Start: 2021-07-06 — End: 2021-07-06

## 2021-07-05 MED ORDER — AMLODIPINE BESYLATE 5 MG PO TABS: 5.0000 mg | ORAL_TABLET | Freq: Every day | ORAL | 0 refills | Status: DC

## 2021-07-05 MED ORDER — LEVOTHYROXINE SODIUM 25 MCG PO TABS
125.0000 ug | ORAL_TABLET | Freq: Every day | ORAL | Status: DC
  Administered 2021-07-06 – 2021-07-09 (×4): 125 ug via ORAL
  Filled 2021-07-05 (×5): qty 1

## 2021-07-05 NOTE — Progress Notes (Signed)
PROGRESS NOTE    Donna Patrick  [id number] DOB: 1950/09/29 DOA: 06/20/2021 PCP: Caren Macadam, MD   Brief Narrative: 13.  With past medical history significant for moderate dementia, recent loss of her daughter 3 weeks prior to admission,  patient became unresponsive 07/13/2021, she sustained a cardiac arrest, wide PEA.  Patient received CPR for 15 minutes and 1 round of epi.  She ultimately had ROSC.  On arrival to the ED she had a spontaneous breathing but minimal neurological function and was intubated.  CT head was negative for acute process.  Hospital events;  11/09; admitted for cardiac arrest, intubated in the ED 11/09; EEG concerning severe diffuse encephalopathy 11/11; continuous EEG suggestive of moderate to severe diffuse encephalopathy, MRI without acute abnormality.  Goals of care discussion with plan for one-way extubation and transition to DNR on extubation. 11/12; extubated    Assessment & Plan:   Active Problems:   Cardiac arrest ([place])   Pressure injury of skin   Malnutrition of moderate degree   Acute hypoxemic respiratory failure ([place])   1-PEA, Cardia Arrest;  -Unclear etiology.  -ECHO: Ejection fraction 55 to [id number], diastolic dysfunction grade 1. -Mild elevation of troponin -No significant electrolytes abnormality on admission -Cardiology has been consulted, for further recommendations.   Hypokalemia: Resolved.   Obstructive uropathy, probably: Foley catheter placed 11/14.  She will need voiding trial in 2 weeks. -hold bethanechol.  -she will need voiding trial.   Acute metabolic encephalopathy, Underline Mild dementia Anoxic brain injury.  -MRI 11/11: Negative for acute infarct, there is moderate atrophy.  Periventricular white matter hyperintensity may represent chronic ischemia. -EEG; study suggestive of moderate to severe diffuse encephalopathy, nonspecific etiology but could be secondary to sedation, anoxic hypoxic brain injury.  No seizure  or definite epileptiform discharge were seen throughout the recording.  -repeated CT head no acute changes.  ammonia level normal. , repeat TSH 29, increase synthroid. , B12 level elevated. . Suspect  Delirium. ABG negative for hypoxemia or hypercapnia.  Continue with delirium and jerking movement. I have consulted neurology for further recommendation.    HTN;  Sinus tachycardia; started on Toprol.  Continue with Norvasc.  BP better controlled.   Possible aspiration pneumonia: Received 5 days of cefepime and vancomycin. Patient will required Oxygen at HS>   Hypothyroidism: TSH on admission at 25.  Continue with Synthroid. Repeated TSH 29, Free T 3 low 1.9.  Will increase synthroid to 125 to help with subclinical hypothyroidism.   Stage I coccygeal pressure ulcer: Follow Wound care recommendation.  Transaminases: Abdominal ultrasound 11/9: Layering gallbladder sludge, without associated sonographic findings to suggest acute cholecystitis.  Otherwise negative abdominal ultrasound. -resolved.   Moderate Malnutrition  On supplement.        Nutrition Problem: Moderate Malnutrition Etiology: chronic illness (Alzheimer's dementia)    Signs/Symptoms: mild fat depletion, mild muscle depletion    Interventions: Ensure Enlive (each supplement provides 350kcal and 20 grams of protein)  Estimated body mass index is 24.56 kg/m as calculated from the following:   Height as of this encounter: 5\' 2"  (1.575 m).   Weight as of this encounter: 60.9 kg.   DVT prophylaxis: Heparin Code Status: DNR Family Communication: Son and daughter in [employer]. who were at bedside Disposition Plan:  Status is: Inpatient  Remains inpatient appropriate because: Patient with probably delirium currently, plan for rehab when bed available, also cardiology consultation        Consultants:  CCM admitted patient EGD 11/16  Procedures:  Echo: Normal ejection fraction  Antimicrobials:     Subjective: She is alert, appears weak. Her speech is more clear to me. To son speech continue to be same as yesterday. Patient was able to say her name, it took her some time to repeat her name when I asked her later in the day. She was not able to recognized family. She continue to have some jerking movement. Son is concern about her mental status, I have consulted Neurologist.    Objective: Vitals:   07/05/21 0900 07/05/21 1100 07/05/21 1200 07/05/21 1300  BP: 129/62 (!) 148/70 112/64 (!) 153/75  Pulse: 79 89 87 81  Resp: 16 18 (!) 24 (!) 22  Temp:  98.2 F (36.8 C)    TempSrc:  Oral    SpO2: 92% 97% 91% 93%  Weight:      Height:        Intake/Output Summary (Last 24 hours) at 07/05/2021 1422 Last data filed at 07/05/2021 1200 Gross per 24 hour  Intake 205 ml  Output 540 ml  Net -335 ml    Filed Weights   07/03/21 0500 07/04/21 0500 07/05/21 0412  Weight: 59.7 kg 61.4 kg 60.9 kg    Examination:  General exam: alert. Fall sleep at times Respiratory system: CTA Cardiovascular system: S 1, S 2  RRR Gastrointestinal system: BS present, soft, nt Central nervous system: she was alert, generalized weak, sleep at times. She  was able to tell me her name. Didn't recognized family.  Extremities: No edema   Data Reviewed: I have personally reviewed following labs and imaging studies  CBC: Recent Labs  Lab 06/29/21 0355 07/01/21 0311 07/03/21 0206 07/05/21 1056 07/05/21 1217  WBC 7.7 7.7 8.8  --  10.4  HGB 11.8* 11.4* 11.9* 12.2 12.5  HCT 34.5* 33.1* 35.2* 36.0 37.1  MCV 89.1 86.2 88.0  --  89.0  PLT PLATELET CLUMPS NOTED ON SMEAR, COUNT APPEARS DECREASED 147* 252  --  [id number]    Basic Metabolic Panel: Recent Labs  Lab 06/29/21 0355 07/01/21 0311 07/02/21 0232 07/03/21 0206 07/04/21 0207 07/05/21 0115 07/05/21 1056  NA 137 133* 135 135 133* 136 136  K 3.6 3.5 3.7 3.0* 3.3* 3.8 4.7  CL 105 95* 99 100 99 102  --   CO2 24 29 25 25 25 26   --   GLUCOSE  117* 102* 103* 98 117* 115*  --   BUN 13 6* 10 11 12 12   --   CREATININE 0.70 0.57 0.65 0.68 0.67 0.66  --   CALCIUM 8.0* 8.5* 9.0 8.8* 8.8* 8.9  --   MG 2.1 2.2  --   --  2.2 2.2  --   PHOS 2.2* 2.8  --   --   --   --   --     GFR: Estimated Creatinine Clearance: 57.8 mL/min (by C-G formula based on SCr of 0.66 mg/dL). Liver Function Tests: Recent Labs  Lab 07/03/21 0206 07/04/21 0207  AST 30 22  ALT 35 29  ALKPHOS 115 113  BILITOT 0.7 0.4  PROT 5.6* 5.8*  ALBUMIN 3.0* 3.1*    No results for input(s): LIPASE, AMYLASE in the last 168 hours. Recent Labs  Lab 07/04/21 1012  AMMONIA 18   Coagulation Profile: No results for input(s): INR, PROTIME in the last 168 hours. Cardiac Enzymes: No results for input(s): CKTOTAL, CKMB, CKMBINDEX, TROPONINI in the last 168 hours. BNP (last 3 results) No results for input(s): PROBNP in the last 8760  hours. HbA1C: No results for input(s): HGBA1C in the last 72 hours. CBG: Recent Labs  Lab 07/04/21 2122 07/04/21 2318 07/05/21 0326 07/05/21 0743 07/05/21 1207  GLUCAP 116* 105* 112* 105* 113*    Lipid Profile: No results for input(s): CHOL, HDL, LDLCALC, TRIG, CHOLHDL, LDLDIRECT in the last 72 hours. Thyroid Function Tests: Recent Labs    07/04/21 1012  TSH 29.616*  FREET4 1.10  T3FREE 1.9*   Anemia Panel: Recent Labs    07/04/21 1012  VITAMINB12 2,421*   Sepsis Labs: Recent Labs  Lab 06/29/21 2042  LATICACIDVEN 1.0     Recent Results (from the past 240 hour(s))  Culture, blood (routine x 2)     Status: None   Collection Time: 07/15/2021 10:23 AM   Specimen: BLOOD  Result Value Ref Range Status   Specimen Description BLOOD LEFT ANTECUBITAL  Final   Special Requests   Final    BOTTLES DRAWN AEROBIC AND ANAEROBIC Blood Culture adequate volume   Culture   Final    NO GROWTH 5 DAYS Performed at [facility], [place] [address], [place], [place] [address]    Report Status 07/02/2021 FINAL  Final  Resp Panel  by RT-PCR (Flu A&B, Covid) Nasopharyngeal Swab     Status: None   Collection Time: 07/10/2021 10:36 AM   Specimen: Nasopharyngeal Swab; Nasopharyngeal(NP) swabs in vial transport medium  Result Value Ref Range Status   SARS Coronavirus 2 by RT PCR NEGATIVE NEGATIVE Final    Comment: (NOTE) SARS-CoV-2 target nucleic acids are NOT DETECTED.  The SARS-CoV-2 RNA is generally detectable in upper respiratory specimens during the acute phase of infection. The lowest concentration of SARS-CoV-2 viral copies this assay can detect is 138 copies/mL. A negative result does not preclude SARS-Cov-2 infection and should not be used as the sole basis for treatment or other patient management decisions. A negative result may occur with  improper specimen collection/handling, submission of specimen other than nasopharyngeal swab, presence of viral mutation(s) within the areas targeted by this assay, and inadequate number of viral copies(<138 copies/mL). A negative result must be combined with clinical observations, patient history, and epidemiological information. The expected result is Negative.  Fact Sheet for Patients:  EntrepreneurPulse.com.au  Fact Sheet for Healthcare Providers:  IncredibleEmployment.be  This test is no t yet approved or cleared by the [place] FDA and  has been authorized for detection and/or diagnosis of SARS-CoV-2 by FDA under an Emergency Use Authorization (EUA). This EUA will remain  in effect (meaning this test can be used) for the duration of the COVID-19 declaration under Section 564(b)(1) of the Act, 21 U.S.C.section 360bbb-3(b)(1), unless the authorization is terminated  or revoked sooner.       Influenza A by PCR NEGATIVE NEGATIVE Final   Influenza B by PCR NEGATIVE NEGATIVE Final    Comment: (NOTE) The Xpert Xpress SARS-CoV-2/FLU/RSV plus assay is intended as an aid in the diagnosis of influenza from Nasopharyngeal swab  specimens and should not be used as a sole basis for treatment. Nasal washings and aspirates are unacceptable for Xpert Xpress SARS-CoV-2/FLU/RSV testing.  Fact Sheet for Patients: EntrepreneurPulse.com.au  Fact Sheet for Healthcare Providers: IncredibleEmployment.be  This test is not yet approved or cleared by the [place] FDA and has been authorized for detection and/or diagnosis of SARS-CoV-2 by FDA under an Emergency Use Authorization (EUA). This EUA will remain in effect (meaning this test can be used) for the duration of the COVID-19 declaration under Section 564(b)(1)  of the Act, 21 U.S.C. section 360bbb-3(b)(1), unless the authorization is terminated or revoked.  Performed at [facility], [place] [address], [place], [place] [address]   Culture, blood (routine x 2)     Status: None   Collection Time: 06/24/2021 11:02 AM   Specimen: BLOOD RIGHT HAND  Result Value Ref Range Status   Specimen Description BLOOD RIGHT HAND  Final   Special Requests   Final    BOTTLES DRAWN AEROBIC AND ANAEROBIC Blood Culture results may not be optimal due to an inadequate volume of blood received in culture bottles   Culture   Final    NO GROWTH 5 DAYS Performed at [facility], [place] [address], [place], [place] [address]    Report Status 07/02/2021 FINAL  Final  Respiratory (~20 pathogens) panel by PCR     Status: None   Collection Time: 06/20/2021  4:10 PM   Specimen: Nasopharyngeal Swab; Respiratory  Result Value Ref Range Status   Adenovirus NOT DETECTED NOT DETECTED Final   Coronavirus 229E NOT DETECTED NOT DETECTED Final    Comment: (NOTE) The Coronavirus on the Respiratory Panel, DOES NOT test for the novel  Coronavirus (2019 nCoV)    Coronavirus HKU1 NOT DETECTED NOT DETECTED Final   Coronavirus NL63 NOT DETECTED NOT DETECTED Final   Coronavirus OC43 NOT DETECTED NOT DETECTED Final   Metapneumovirus NOT DETECTED NOT DETECTED Final    Rhinovirus / Enterovirus NOT DETECTED NOT DETECTED Final   Influenza A NOT DETECTED NOT DETECTED Final   Influenza B NOT DETECTED NOT DETECTED Final   Parainfluenza Virus 1 NOT DETECTED NOT DETECTED Final   Parainfluenza Virus 2 NOT DETECTED NOT DETECTED Final   Parainfluenza Virus 3 NOT DETECTED NOT DETECTED Final   Parainfluenza Virus 4 NOT DETECTED NOT DETECTED Final   Respiratory Syncytial Virus NOT DETECTED NOT DETECTED Final   Bordetella pertussis NOT DETECTED NOT DETECTED Final   Bordetella Parapertussis NOT DETECTED NOT DETECTED Final   Chlamydophila pneumoniae NOT DETECTED NOT DETECTED Final   Mycoplasma pneumoniae NOT DETECTED NOT DETECTED Final    Comment: Performed at [facility], [place] [address], [place], [place] [address]  MRSA Next Gen by PCR, Nasal     Status: Abnormal   Collection Time: 06/28/21 11:41 AM   Specimen: Nasal Mucosa; Nasal Swab  Result Value Ref Range Status   MRSA by PCR Next Gen DETECTED (A) NOT DETECTED Final    Comment: RESULT CALLED TO, READ BACK BY AND VERIFIED WITH: Annetta Maw RN, AT 06/28/21 D. Victoriano Lain (NOTE) The GeneXpert MRSA Assay (FDA approved for NASAL specimens only), is one component of a comprehensive MRSA colonization surveillance program. It is not intended to diagnose MRSA infection nor to guide or monitor treatment for MRSA infections. Test performance is not FDA approved in patients less than 36 years old. Performed at [facility], [place] [address], [place], [place] [address]   SARS CORONAVIRUS 2 (TAT 6-24 HRS) Nasopharyngeal Nasopharyngeal Swab     Status: None   Collection Time: 07/03/21  4:43 PM   Specimen: Nasopharyngeal Swab  Result Value Ref Range Status   SARS Coronavirus 2 NEGATIVE NEGATIVE Final    Comment: (NOTE) SARS-CoV-2 target nucleic acids are NOT DETECTED.  The SARS-CoV-2 RNA is generally detectable in upper and lower respiratory specimens during the acute phase of infection. Negative results do not  preclude SARS-CoV-2 infection, do not rule out co-infections with other pathogens, and should not be  used as the sole basis for treatment or other patient management decisions. Negative results must be combined with clinical observations, patient history, and epidemiological information. The expected result is Negative.  Fact Sheet for Patients: HairSlick.no  Fact Sheet for Healthcare Providers: quierodirigir.com  This test is not yet approved or cleared by the [place] FDA and  has been authorized for detection and/or diagnosis of SARS-CoV-2 by FDA under an Emergency Use Authorization (EUA). This EUA will remain  in effect (meaning this test can be used) for the duration of the COVID-19 declaration under Se ction 564(b)(1) of the Act, 21 U.S.C. section 360bbb-3(b)(1), unless the authorization is terminated or revoked sooner.  Performed at [facility], [address], [place], [place] [address]           Radiology Studies: CT HEAD WO CONTRAST ( )  Result Date: 07/04/2021 CLINICAL DATA:  70 year old female with history of delirium. EXAM: CT HEAD WITHOUT CONTRAST TECHNIQUE: Contiguous axial images were obtained from the base of the skull through the vertex without intravenous contrast. COMPARISON:  Head CT 06/22/2021. FINDINGS: Brain: Moderate cerebral atrophy with ex vacuo dilatation of the ventricular system. Patchy and confluent areas of decreased attenuation are noted throughout the deep and periventricular white matter of the cerebral hemispheres bilaterally, compatible with chronic microvascular ischemic disease. No evidence of acute infarction, hemorrhage, hydrocephalus, extra-axial collection or mass lesion/mass effect. Vascular: No hyperdense vessel or unexpected calcification. Skull: Normal. Negative for fracture or focal lesion. Sinuses/Orbits: No acute finding. Other: None. IMPRESSION: 1. No acute  intracranial abnormalities. 2. Moderate cerebral atrophy with extensive chronic microvascular ischemic changes in the cerebral white matter, as above. Electronically Signed   By: Trudie Reed M.D.   On: 07/04/2021 11:25        Scheduled Meds:  amLODipine  5 mg Oral Daily   [START ON 07/06/2021] Chlorhexidine Gluconate Cloth  6 each Topical Q0600   donepezil  5 mg Oral QHS   feeding supplement  237 mL Oral TID BM   heparin  5,000 Units Subcutaneous Q8H   insulin aspart  0-15 Units Subcutaneous TID WC & HS   [START ON 07/06/2021] levothyroxine  125 mcg Oral Daily   memantine  5 mg Oral BID   metoprolol succinate  12.5 mg Oral Daily   multivitamin with minerals  1 tablet Oral Daily   polyethylene glycol  17 g Oral Daily   potassium chloride  40 mEq Oral Daily   Continuous Infusions:     LOS: 8 days    Time spent: 35 minutes.     Alba Cory, MD [facility]   If 7PM-7AM, please contact night-coverage [email or web address]  07/05/2021, 2:22 PM

## 2021-07-05 NOTE — Progress Notes (Signed)
Patient transported to [address] with all personal belongings. Son, Aniyah Nobis, notified of patient's transfer at number listed in patient's chart.

## 2021-07-05 NOTE — Progress Notes (Signed)
Physical Therapy Treatment Patient Details Name: Donna Patrick MRN: [id number] DOB: Jan 22, 1951 Today's Date: 07/05/2021   History of Present Illness 70 yo admitted 11/9 after PEA arrest at home with ROSC after 15 min CPR. Intubated 11/9-11/12. Pt with encephalopathy. PMHx: Alzheimers, hypothyroidism, HLD    PT Comments    Pt awake and sitting in bed on arrival with increased ability to assist with standing and pivot today. Pt maintains left inattention with right lean but able to progress to midline sitting after left forearm propping today. Pt with max cues to assist with combing hair and with delay and assist able to recognize and find comb but unable to reach for it or assist with task. Will continue to follow.   VSS on RA throughout    Recommendations for follow up therapy are one component of a multi-disciplinary discharge planning process, led by the attending physician.  Recommendations may be updated based on patient status, additional functional criteria and insurance authorization.  Follow Up Recommendations  Skilled nursing-short term rehab (<3 hours/day)     Assistance Recommended at Discharge Frequent or constant Supervision/Assistance  Equipment Recommendations  Hospital bed;Wheelchair (measurements PT)    Recommendations for Other Services       Precautions / Restrictions Precautions Precautions: Fall Precaution Comments: very stiff with left inattention and right lean     Mobility  Bed Mobility Overal bed mobility: Needs Assistance Bed Mobility: Supine to Sit     Supine to sit: HOB elevated;Max assist     General bed mobility comments: pt not assisting with bil LE movement toward EOB with max assist to pivot pelvis and assist to elevate trunk from surface with HOB 40 degrees. EOB pt with right lean with mod assist to achieve left forearm propping x 2 with pt returning to midline unassisted after propping    Transfers Overall transfer level: Needs  assistance   Transfers: Sit to/from Stand;Bed to chair/wheelchair/BSC Sit to Stand: Max assist Stand pivot transfers: Max assist;+2 safety/equipment         General transfer comment: max assist with pad to stand from bed and recliner with bil knees blocked. Pivot bed to chair with pad and belt with pt taking short shuffling steps. Attempted gait with bil UE support but pt with narrow BOS and LLE standing and RLE and unable to achieve midline and progress with posterior right lean    Ambulation/Gait               General Gait Details: unable   Stairs             Wheelchair Mobility    Modified Rankin (Stroke Patients Only)       Balance Overall balance assessment: Needs assistance Sitting-balance support: No upper extremity supported;Feet supported Sitting balance-Leahy Scale: Poor Sitting balance - Comments: min assist to min guard at EOB, preference to R lateral lean   Standing balance support: Bilateral upper extremity supported;During functional activity Standing balance-Leahy Scale: Poor Standing balance comment: mod to max static standing                            Cognition Arousal/Alertness: Awake/alert Behavior During Therapy: Flat affect Overall Cognitive Status: No family/caregiver present to determine baseline cognitive functioning Area of Impairment: Attention;Memory;Following commands;Safety/judgement;Awareness;Problem solving                   Current Attention Level: Focused Memory: Decreased short-term memory Following Commands: Follows one step  commands inconsistently Safety/Judgement: Decreased awareness of safety;Decreased awareness of deficits   Problem Solving: Slow processing;Decreased initiation;Difficulty sequencing;Requires verbal cues;Requires tactile cues General Comments: pt with hx of Alzheimers. Pt oriented to self only, follows simple commands at times with delay. left inattention        Exercises       General Comments        Pertinent Vitals/Pain Pain Assessment: PAINAD Breathing: normal Negative Vocalization: none Facial Expression: smiling or inexpressive Body Language: tense, distressed pacing, fidgeting Consolability: no need to console PAINAD Score: 1    Home Living                          Prior Function            PT Goals (current goals can now be found in the care plan section) Progress towards PT goals: Progressing toward goals    Frequency    Min 2X/week      PT Plan Current plan remains appropriate    Co-evaluation              AM-PAC PT "6 Clicks" Mobility   Outcome Measure  Help needed turning from your back to your side while in a flat bed without using bedrails?: A Lot Help needed moving from lying on your back to sitting on the side of a flat bed without using bedrails?: Total Help needed moving to and from a bed to a chair (including a wheelchair)?: Total Help needed standing up from a chair using your arms (e.g., wheelchair or bedside chair)?: Total Help needed to walk in hospital room?: Total Help needed climbing 3-5 steps with a railing? : Total 6 Click Score: 7    End of Session Equipment Utilized During Treatment: Gait belt Activity Tolerance: Patient tolerated treatment well Patient left: in chair;with call bell/phone within reach;with chair alarm set Nurse Communication: Mobility status;Other (comment) (+2 stand pivot) PT Visit Diagnosis: Other abnormalities of gait and mobility (R26.89);Difficulty in walking, not elsewhere classified (R26.2);Muscle weakness (generalized) (M62.81)     Time: 7614-7092 PT Time Calculation (min) (ACUTE ONLY): 17 min  Charges:  $Therapeutic Activity: 8-22 mins                     Havah Ammon P, PT Acute Rehabilitation Services Pager: [phone] Office: [phone]    Bridie Colquhoun B Eriyanna Kofoed 07/05/2021, 9:24 AM

## 2021-07-05 NOTE — Progress Notes (Addendum)
Medication Consult: Evaluation of Patient's Medications That May Cause Myoclonus  Pharmacy was consulted to review the medications of Donna Patrick regarding potential to cause myoclonus.   The most common classes of medications that cause myoclonus include levodopa, amantadine, psychiatric medications (e.g., tricyclic antidepressants, SSRIs, MOAIs, lithium), antibiotics, narcotics (meperidine, morphine), anti-seizure meds, anesthetics, contrast media, cardiac medications (calcium channel blockers, antiarrhythmic agents), and drug withdrawal from certain agents (e.g., sedatives).  The following information was found re: the potential of Donna Patrick's medications to cause myoclonus:  Amlodipine (calcium channel blocker) - has rarely been associated with myoclonus (more commonly reported with other calcium channel blockers) Donepezil - multiple cases of this med causing myoclonus Levothyroxine - hypothyroidism and hyperthyroidism have been associated with myoclonus (recent TSH was 29.616) Metoprolol XL - a few cases have been reported Memantine - several cases of myclonus have been reported Ondansetron - several cases have been reported, most commonly in the setting of serotonin syndrome  The patient's other meds (insulin, MVI, polyethylene glycol, potassium, hydralazine) have not been associated with myoclonus.  Regarding possible withdrawal syndromes causing myoclonus, the pt was taking trazodone, lorazepam and risperidone PTA, but these meds were not continued on admission. All of these medications are associated with withdrawal syndromes. In particular, risperidone withdrawal has been associated with uncontrollable muscle movements.  In summary, although there are at least case reports of myclonus associated with several of Donna Patrick's medications, donepezil, memantine and ondansetron were more frequently associated with myclonus, and risperidol withdrawal may cause uncontrolled muscle  movements.  Vicki Mallet, PharmD, BCPS, [facility] Clinical Pharmacist

## 2021-07-05 NOTE — Progress Notes (Signed)
Occupational Therapy Treatment Patient Details Name: Donna Patrick MRN: [id number] DOB: 1950-08-29 Today's Date: 07/05/2021   History of present illness 70 yo admitted 11/9 after PEA arrest at home with ROSC after 15 min CPR. Intubated 11/9-11/12. Pt with encephalopathy. PMHx: Alzheimers, hypothyroidism, HLD   OT comments  Pt seated in recliner, family at side.  Pt responds well to her daughter in law, but limited engagement with therapist.  Pt requires total assist for grooming task, no carryover today after initiation.  She requires cueing to keep eyes open.  Max assist to stand, total assist +2 for hygiene (incontinent BM in chair) and max +2 to pivot to recliner.  SNF remains appropriate.  Will follow.    Recommendations for follow up therapy are one component of a multi-disciplinary discharge planning process, led by the attending physician.  Recommendations may be updated based on patient status, additional functional criteria and insurance authorization.    Follow Up Recommendations  Skilled nursing-short term rehab (<3 hours/day)    Assistance Recommended at Discharge Frequent or constant Supervision/Assistance  Equipment Recommendations  Other (comment) (tBD)    Recommendations for Other Services      Precautions / Restrictions Precautions Precautions: Fall Precaution Comments: very stiff with left inattention and right lean Restrictions Weight Bearing Restrictions: No       Mobility Bed Mobility Overal bed mobility: Needs Assistance Bed Mobility: Sit to Supine       Sit to supine: Total assist;+2 for physical assistance;+2 for safety/equipment   General bed mobility comments: pt not assisting with movement into bed, total assist +2 for trunk and LB support.    Transfers Overall transfer level: Needs assistance   Transfers: Sit to/from Stand;Bed to chair/wheelchair/BSC Sit to Stand: Max assist Stand pivot transfers: Max assist;+2 safety/equipment;+2 physical  assistance         General transfer comment: max assist to stand with B knees blocked and heavy posterior lean, pivoting to recliner     Balance Overall balance assessment: Needs assistance Sitting-balance support: No upper extremity supported;Feet supported Sitting balance-Leahy Scale: Poor Sitting balance - Comments: limited tolerance without support in recliner, min assist with R lateral lean Postural control: Right lateral lean Standing balance support: Bilateral upper extremity supported;During functional activity Standing balance-Leahy Scale: Poor Standing balance comment: max assist static standing                           ADL either performed or assessed with clinical judgement   ADL Overall ADL's : Needs assistance/impaired     Grooming: Total assistance;Sitting;Brushing hair Grooming Details (indicate cue type and reason): total assist to comb hair, hand over hand and no carryover in task                 Toilet Transfer: +2 for physical assistance;+2 for safety/equipment;Maximal assistance;Stand-pivot Toilet Transfer Details (indicate cue type and reason): simulated from recliner to EOB Toileting- Clothing Manipulation and Hygiene: Total assistance;+2 for physical assistance;Sit to/from stand Toileting - Clothing Manipulation Details (indicate cue type and reason): incontient of BM     Functional mobility during ADLs: Maximal assistance;Total assistance;+2 for physical assistance;+2 for safety/equipment      Extremity/Trunk Assessment              Vision       Perception     Praxis      Cognition Arousal/Alertness: Lethargic Behavior During Therapy: Flat affect Overall Cognitive Status: Difficult to assess Area of  Impairment: Following commands;Awareness                       Following Commands: Follows one step commands inconsistently   Awareness: Intellectual   General Comments: pt not following commands, lethargic,  and incontinent of bowels          Exercises     Shoulder Instructions       General Comments      Pertinent Vitals/ Pain       Pain Assessment: PAINAD Breathing: normal Negative Vocalization: none Facial Expression: smiling or inexpressive Body Language: tense, distressed pacing, fidgeting Consolability: no need to console PAINAD Score: 1  Home Living                                          Prior Functioning/Environment              Frequency  Min 2X/week        Progress Toward Goals  OT Goals(current goals can now be found in the care plan section)  Progress towards OT goals: Not progressing toward goals - comment (decreased engagement today)  Acute Rehab OT Goals Patient Stated Goal: none stated OT Goal Formulation: Patient unable to participate in goal setting  Plan Discharge plan remains appropriate;Frequency remains appropriate    Co-evaluation                 AM-PAC OT "6 Clicks" Daily Activity     Outcome Measure   Help from another person eating meals?: Total Help from another person taking care of personal grooming?: Total Help from another person toileting, which includes using toliet, bedpan, or urinal?: Total Help from another person bathing (including washing, rinsing, drying)?: Total Help from another person to put on and taking off regular upper body clothing?: Total Help from another person to put on and taking off regular lower body clothing?: Total 6 Click Score: 6    End of Session Equipment Utilized During Treatment: Gait belt  OT Visit Diagnosis: Other abnormalities of gait and mobility (R26.89);Muscle weakness (generalized) (M62.81)   Activity Tolerance Patient limited by lethargy;Patient limited by fatigue   Patient Left in bed;with call bell/phone within reach;with bed alarm set;with nursing/sitter in room   Nurse Communication Mobility status        Time: 1140-1205 OT Time Calculation  (min): 25 min  Charges: OT General Charges $OT Visit: 1 Visit OT Treatments $Self Care/Home Management : 23-37 mins  Barry Brunner, OT Acute Rehabilitation Services Pager [phone] Office [phone]   Chancy Milroy 07/05/2021, 1:35 PM

## 2021-07-05 NOTE — TOC Progression Note (Addendum)
Transition of Care [facility]) - Progression Note    Patient Details  Name: Donna Patrick MRN: [id number] Date of Birth: [birth year]  Transition of Care [facility]) CM/SW Contact  Lorri Frederick, LCSW Phone Number: 07/05/2021, 11:02 AM  Clinical Narrative:   CSW spoke to son Donna Patrick by phone, discussed bed offers, Donna Patrick has now offered as well.  Donna Patrick chose Donna Patrick.    1200: Donna Patrick/[facility] confirmed they can accept pt.  Probable DC tomorrow.           Expected Discharge Plan and Services                                                 Social Determinants of Health (SDOH) Interventions    Readmission Risk Interventions No flowsheet data found.

## 2021-07-05 NOTE — Plan of Care (Signed)

## 2021-07-05 NOTE — Progress Notes (Signed)
Subjective:  Denies any chest pain or shortness of breath up in chair.  No significant arrhythmias on the monitor.  No active cardiac issues.  Objective:  Vital Signs in the last 24 hours: Temp:  [97.6 F (36.4 C)-98.6 F (37 C)] 98.6 F (37 C) (11/17 0740) Pulse Rate:  [76-117] 79 (11/17 0900) Resp:  [10-25] 16 (11/17 0900) BP: (123-176)/(62-103) 129/62 (11/17 0900) SpO2:  [87 %-100 %] 92 % (11/17 0900) Weight:  [60.9 kg] 60.9 kg (11/17 0412)  Intake/Output from previous day: 11/16 0701 - 11/17 0700 In: 75 [P.O.:75] Out: 500 [Urine:500] Intake/Output from this shift: Total I/O In: 180 [P.O.:180] Out: 100 [Urine:100]  Physical Exam: Neck: no adenopathy, no carotid bruit, no JVD, and supple, symmetrical, trachea midline Lungs: clear to auscultation bilaterally Heart: regularly irregular rhythm, S1, S2 normal, and no S3 or S4 Abdomen: soft, non-tender; bowel sounds normal; no masses,  no organomegaly Extremities: extremities normal, atraumatic, no cyanosis or edema  Lab Results: Recent Labs    07/03/21 0206  WBC 8.8  HGB 11.9*  PLT 252   Recent Labs    07/04/21 0207 07/05/21 0115  NA 133* 136  K 3.3* 3.8  CL 99 102  CO2 25 26  GLUCOSE 117* 115*  BUN 12 12  CREATININE 0.67 0.66   No results for input(s): TROPONINI in the last 72 hours.  Invalid input(s): CK, MB Hepatic Function Panel Recent Labs    07/04/21 0207  PROT 5.8*  ALBUMIN 3.1*  AST 22  ALT 29  ALKPHOS 113  BILITOT 0.4   No results for input(s): CHOL in the last 72 hours. No results for input(s): PROTIME in the last 72 hours.  Imaging: CT HEAD WO CONTRAST ( )  Result Date: 07/04/2021 CLINICAL DATA:  70 year old female with history of delirium. EXAM: CT HEAD WITHOUT CONTRAST TECHNIQUE: Contiguous axial images were obtained from the base of the skull through the vertex without intravenous contrast. COMPARISON:  Head CT 07-05-21. FINDINGS: Brain: Moderate cerebral atrophy with ex vacuo  dilatation of the ventricular system. Patchy and confluent areas of decreased attenuation are noted throughout the deep and periventricular white matter of the cerebral hemispheres bilaterally, compatible with chronic microvascular ischemic disease. No evidence of acute infarction, hemorrhage, hydrocephalus, extra-axial collection or mass lesion/mass effect. Vascular: No hyperdense vessel or unexpected calcification. Skull: Normal. Negative for fracture or focal lesion. Sinuses/Orbits: No acute finding. Other: None. IMPRESSION: 1. No acute intracranial abnormalities. 2. Moderate cerebral atrophy with extensive chronic microvascular ischemic changes in the cerebral white matter, as above. Electronically Signed   By: Trudie Reed M.D.   On: 07/04/2021 11:25    Cardiac Studies:  Assessment/Plan:  Status post PEA cardiac arrest. Minimally elevated high sensitivity troponin I is secondary to body.  Doubt significant MI Status post hypoxic/hypercarbic respiratory failure. Progressive dementia. Hypothyroidism. Hyperlipidemia Plan Continue present management I will sign off please call if needed  LOS: 8 days    Donna Patrick 07/05/2021, 9:55 AM
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
## 2021-07-05 NOTE — Progress Notes (Signed)
Nutrition Follow-up  DOCUMENTATION CODES:   Non-severe (moderate) malnutrition in context of chronic illness  INTERVENTION:   Downgrade diet to pureed textures (dysphagia 1 with thin liquids) for ease of chewing. Increase Ensure Enlive to TID, each supplement provides 350 kcal and 20 grams of protein. Continue MVI with minerals daily.  NUTRITION DIAGNOSIS:   Moderate Malnutrition related to chronic illness (Alzheimer's dementia) as evidenced by mild fat depletion, mild muscle depletion.  Ongoing  GOAL:   Patient will meet greater than or equal to 90% of their needs  Progressing  MONITOR:   PO intake, Supplement acceptance, Labs  REASON FOR ASSESSMENT:   Ventilator, Consult Enteral/tube feeding initiation and management  ASSESSMENT:   70 yo female admitted S/P cardiac arrest. PMH includes Alzheimer's dementia, hypothyroidism, HLD.  Discussed patient in ICU rounds and with RN today. Diet was advanced to regular with thin liquids on 11/15. Per discussion with RN, patient takes pureed consistencies well. She does not do as well with regular consistency foods; it takes her a long time to chew and she does not eat very much. Will change back to a dysphagia 1 diet (pureed) with thin liquids for ease of chewing.   Meal intakes 0-50% (average 32% since diet was advanced to regular)  Labs reviewed.  CBG: 112-105  Medications reviewed and include Novolog, MVI with minerals, Klor-Con.   Weight 60.9 kg today Admission weight 67.6 kg   I/O -8 L since admission  Diet Order:   Diet Order             Diet regular Room service appropriate? Yes; Fluid consistency: Thin  Diet effective now                   EDUCATION NEEDS:   No education needs have been identified at this time  Skin: skin tear to R arm  Last BM:  11/17 type 6  Height:   Ht Readings from Last 1 Encounters:  07-15-2021 5\' 2"  (1.575 m)    Weight:   Wt Readings from Last 1 Encounters:   07/05/21 60.9 kg    BMI:  Body mass index is 24.56 kg/m.  Estimated Nutritional Needs:   Kcal:  1500-1800  Protein:  85-100 gm  Fluid:  >/= 1.5 L    07/07/21, RD, LDN, CNSC Please refer to Amion for contact information.

## 2021-07-06 DIAGNOSIS — N179 Acute kidney failure, unspecified: Secondary | ICD-10-CM | POA: Diagnosis not present

## 2021-07-06 DIAGNOSIS — Z789 Other specified health status: Secondary | ICD-10-CM

## 2021-07-06 DIAGNOSIS — J9601 Acute respiratory failure with hypoxia: Secondary | ICD-10-CM | POA: Diagnosis not present

## 2021-07-06 DIAGNOSIS — Z515 Encounter for palliative care: Secondary | ICD-10-CM

## 2021-07-06 DIAGNOSIS — Z66 Do not resuscitate: Secondary | ICD-10-CM

## 2021-07-06 DIAGNOSIS — E440 Moderate protein-calorie malnutrition: Secondary | ICD-10-CM | POA: Diagnosis not present

## 2021-07-06 DIAGNOSIS — I469 Cardiac arrest, cause unspecified: Secondary | ICD-10-CM | POA: Diagnosis not present

## 2021-07-06 LAB — GLUCOSE, CAPILLARY
Glucose-Capillary: 100 mg/dL — ABNORMAL HIGH (ref 70–99)
Glucose-Capillary: 127 mg/dL — ABNORMAL HIGH (ref 70–99)
Glucose-Capillary: 119 mg/dL — ABNORMAL HIGH (ref 70–99)
Glucose-Capillary: 150 mg/dL — ABNORMAL HIGH (ref 70–99)

## 2021-07-06 MED ORDER — METOPROLOL SUCCINATE ER 25 MG PO TB24: 12.5000 mg | ORAL_TABLET | Freq: Every day | ORAL | 3 refills | Status: AC

## 2021-07-06 MED ORDER — RISPERIDONE 0.25 MG PO TABS
0.2500 mg | ORAL_TABLET | Freq: Every day | ORAL | Status: DC
  Administered 2021-07-06 – 2021-07-09 (×4): 0.25 mg via ORAL
  Filled 2021-07-06 (×5): qty 1

## 2021-07-06 MED ORDER — RISPERIDONE 1 MG PO TABS: 0.5000 mg | ORAL_TABLET | Freq: Every day | ORAL | 0 refills | Status: DC

## 2021-07-06 MED ORDER — RISPERIDONE 1 MG PO TABS: 0.5000 mg | ORAL_TABLET | Freq: Every day | ORAL | 0 refills | Status: AC

## 2021-07-06 MED ORDER — THIAMINE HCL 100 MG/ML IJ SOLN
250.0000 mg | INTRAVENOUS | Status: AC
  Administered 2021-07-06 – 2021-07-08 (×3): 250 mg via INTRAVENOUS
  Filled 2021-07-06: qty 2.5
  Filled 2021-07-06: qty 2
  Filled 2021-07-06 (×2): qty 2.5

## 2021-07-06 MED ORDER — AMLODIPINE BESYLATE 5 MG PO TABS: 5.0000 mg | ORAL_TABLET | Freq: Every day | ORAL | 0 refills | Status: DC

## 2021-07-06 NOTE — TOC Progression Note (Addendum)
Transition of Care [facility]) - Progression Note    Patient Details  Name: Donna Patrick MRN: [id number] Date of Birth: 09-10-1950  [facility]) CM/SW Contact  Lorri Frederick, LCSW Phone Number: 07/06/2021, 9:15 AM  Clinical Narrative:   Pt PASSR submitted in [place] [facility], required additional information, which was uploaded.    1100: Passr obtained from [facility] Must: [id number]         Expected Discharge Plan and Services           Expected Discharge Date: 07/05/21                                     Social Determinants of Health (SDOH) Interventions    Readmission Risk Interventions No flowsheet data found.

## 2021-07-06 NOTE — Discharge Summary (Addendum)
Physician Discharge Summary  Donna Patrick ZOX:[id number] DOB: 07/29/1951 DOA: 06/20/2021  PCP: Wynn BankerKoberlein, Junell C, MD  Admit date: 07/08/2021 Discharge date: 07/06/2021  Admitted From: Home  Disposition:  SNF  Recommendations for Outpatient Follow-up:  Follow up with PCP in 1-2 weeks Please obtain BMP/CBC in one week Patient needs Oxygen at bed time.    Plan to hold discharge for today, Family wants to meet with palliative for goals of care. Meeting for tomorrow.   Discharge Condition: Stable.  CODE STATUS:DNR Diet recommendation: Dysphagia 1  Brief/Interim Summary: 7768.  With past medical history significant for moderate dementia, recent loss of her daughter 3 weeks prior to admission,  patient became unresponsive 07/17/2021, she sustained a cardiac arrest, wide PEA.  Patient received CPR for 15 minutes and 1 round of epi.  She ultimately had ROSC.  On arrival to the ED she had a spontaneous breathing but minimal neurological function and was intubated.  CT head was negative for acute process.   Hospital events;   11/09; admitted for cardiac arrest, intubated in the ED 11/09; EEG concerning severe diffuse encephalopathy 11/11; continuous EEG suggestive of moderate to severe diffuse encephalopathy, MRI without acute abnormality.  Goals of care discussion with plan for one-way extubation and transition to DNR on extubation. 11/12; extubated   1-PEA, Cardia Arrest;  -Unclear etiology.  -ECHO: Ejection fraction 55 to 60%, diastolic dysfunction grade 1. -Mild elevation of troponin -No significant electrolytes abnormality on admission -Cardiology has been consulted, for further recommendations.    Hypokalemia: Resolved.    Obstructive uropathy, probably: Foley catheter placed 11/14.  She will need voiding trial in 2 weeks. -hold bethanechol.  -she will need voiding trial.    Acute metabolic encephalopathy, Underline Dementia Anoxic brain injury.  -MRI 11/11: Negative for  acute infarct, there is moderate atrophy.  Periventricular white matter hyperintensity may represent chronic ischemia. -EEG; study suggestive of moderate to severe diffuse encephalopathy, nonspecific etiology but could be secondary to sedation, anoxic hypoxic brain injury.  No seizure or definite epileptiform discharge were seen throughout the recording.  -repeated CT head no acute changes.  ammonia level normal. , repeat TSH 29, increase synthroid. , B12 level elevated. . Suspect  Delirium. -ABG negative for hypoxemia or hypercapnia.  -Continue with delirium and jerking movement. I have consulted neurology for further recommendation.  No further recommendations. Plan to resume lower home dose risperidone in case myoclonus are related to risperidone withdrawal. Myoclonus might be related to Hypoxia from cardiac arrest.    HTN;  Sinus tachycardia; started on Toprol.  Continue with Norvasc.  BP better controlled.    Possible aspiration pneumonia: Received 5 days of cefepime and vancomycin. Patient will required Oxygen at HS>    Hypothyroidism: TSH on admission at 25.  Continue with Synthroid. Repeated TSH 29, Free T 3 low 1.9.  Will increase synthroid to 125 to help with hypothyroidism.    Stage I coccygeal pressure ulcer: Follow Wound care recommendation.   Transaminases: Abdominal ultrasound 11/9: Layering gallbladder sludge, without associated sonographic findings to suggest acute cholecystitis.  Otherwise negative abdominal ultrasound. -Resolved.    Dementia.  Moderate Malnutrition  On supplement.  Needs palliative care follow up and consult to discussed further goals of care.   patient with poor oral intake. She will need palliative care consult.       Discharge Diagnoses:  Active Problems:   Cardiac arrest (HCC)   Pressure injury of skin   Malnutrition of moderate degree   Acute  hypoxemic respiratory failure [facility])    Discharge Instructions  Discharge Instructions      Diet - low sodium heart healthy   Complete by: As directed    Diet - low sodium heart healthy   Complete by: As directed    Discharge wound care:   Complete by: As directed    See above   Discharge wound care:   Complete by: As directed    See above   Increase activity slowly   Complete by: As directed    Increase activity slowly   Complete by: As directed       Allergies as of 07/06/2021   No Known Allergies      Medication List     STOP taking these medications    hydrOXYzine 25 MG tablet Commonly known as: ATARAX/VISTARIL   LORazepam 0.5 MG tablet Commonly known as: ATIVAN   oxybutynin 10 MG 24 hr tablet Commonly known as: DITROPAN-XL   potassium chloride 10 MEQ tablet Commonly known as: KLOR-CON   sulfamethoxazole-trimethoprim 800-160 MG tablet Commonly known as: BACTRIM DS       TAKE these medications    amLODipine 5 MG tablet Commonly known as: NORVASC Take 1 tablet (5 mg total) by mouth daily.   cholecalciferol 25 MCG (1000 UNIT) tablet Commonly known as: VITAMIN D3 Take 2,000 Units by mouth daily.   donepezil 5 MG tablet Commonly known as: ARICEPT TAKE 1 TABLET(5 MG) BY MOUTH AT BEDTIME What changed: See the new instructions.   levothyroxine 125 MCG tablet Commonly known as: SYNTHROID Take 1 tablet (125 mcg total) by mouth daily. What changed:  medication strength how much to take   meloxicam 7.5 MG tablet Commonly known as: MOBIC Take 1 tablet (7.5 mg total) by mouth daily as needed for pain.   memantine 5 MG tablet Commonly known as: NAMENDA Take 1 tablet (5 mg total) by mouth 2 (two) times daily. What changed:  medication strength See the new instructions.   metoprolol succinate 25 MG 24 hr tablet Commonly known as: TOPROL-XL Take 0.5 tablets (12.5 mg total) by mouth daily.   multivitamin with minerals Tabs tablet Take 1 tablet by mouth daily.   polyethylene glycol 17 g packet Commonly known as: MIRALAX /  GLYCOLAX Take 17 g by mouth daily.   potassium chloride 20 MEQ packet Commonly known as: KLOR-CON Take 20 mEq by mouth daily.   risperiDONE 1 MG tablet Commonly known as: RisperDAL Take 0.5 tablets (0.5 mg total) by mouth at bedtime. What changed:  how much to take when to take this   simvastatin 40 MG tablet Commonly known as: ZOCOR TAKE 1 TABLET(40 MG) BY MOUTH DAILY What changed: See the new instructions.   traZODone 50 MG tablet Commonly known as: DESYREL Take 1 tablet (50 mg total) by mouth at bedtime as needed for sleep.   triamcinolone ointment 0.1 % Commonly known as: KENALOG Apply 1 application topically 2 (two) times daily as needed (rash).   trospium 20 MG tablet Commonly known as: SANCTURA Take 1 tablet (20 mg total) by mouth 2 (two) times daily.               Discharge Care Instructions  (From admission, onward)           Start     Ordered   07/06/21 0000  Discharge wound care:       Comments: See above   07/06/21 1407   07/05/21 0000  Discharge wound care:  Comments: See above   07/05/21 1115            No Known Allergies  Consultations: Neurology CCM   Procedures/Studies: DG Abd 1 View  Result Date: 06/29/2021 CLINICAL DATA:  Emesis EXAM: ABDOMEN - 1 VIEW COMPARISON:  05/28/2021 FINDINGS: Gastric tube tip and side port are below the GE junction. Nonobstructive bowel gas pattern. Supine technique limits evaluation for intraperitoneal free air. S shaped curvature of the thoracolumbar spine. No acute osseous abnormality. IMPRESSION: Nonobstructive bowel gas pattern. Electronically Signed   By: Wiliam Ke M.D.   On: 06/29/2021 13:21   DG Abd 1 View  Result Date: 06/28/2021 CLINICAL DATA:  OG tube placement EXAM: ABDOMEN - 1 VIEW COMPARISON:  None. FINDINGS: NG tube within stomach. Side port below the GE junction. Nonobstructive bowel gas pattern IMPRESSION: IMPRESSION NG tube in stomach Electronically Signed   By: Genevive Bi M.D.   On: 06/28/2021 10:11   CT HEAD WO CONTRAST ( )  Result Date: 07/04/2021 CLINICAL DATA:  69 year old female with history of delirium. EXAM: CT HEAD WITHOUT CONTRAST TECHNIQUE: Contiguous axial images were obtained from the base of the skull through the vertex without intravenous contrast. COMPARISON:  Head CT 2021/07/09. FINDINGS: Brain: Moderate cerebral atrophy with ex vacuo dilatation of the ventricular system. Patchy and confluent areas of decreased attenuation are noted throughout the deep and periventricular white matter of the cerebral hemispheres bilaterally, compatible with chronic microvascular ischemic disease. No evidence of acute infarction, hemorrhage, hydrocephalus, extra-axial collection or mass lesion/mass effect. Vascular: No hyperdense vessel or unexpected calcification. Skull: Normal. Negative for fracture or focal lesion. Sinuses/Orbits: No acute finding. Other: None. IMPRESSION: 1. No acute intracranial abnormalities. 2. Moderate cerebral atrophy with extensive chronic microvascular ischemic changes in the cerebral white matter, as above. Electronically Signed   By: Trudie Reed M.D.   On: 07/04/2021 11:25   CT HEAD WO CONTRAST ( )  Result Date: 07-09-21 CLINICAL DATA:  Mental status change, unknown cause EXAM: CT HEAD WITHOUT CONTRAST TECHNIQUE: Contiguous axial images were obtained from the base of the skull through the vertex without intravenous contrast. COMPARISON:  06/17/2021 FINDINGS: Brain: No evidence of acute infarction, hemorrhage, hydrocephalus, extra-axial collection or mass lesion/mass effect. Moderate low-density changes within the periventricular and subcortical white matter compatible with chronic microvascular ischemic change. Mild diffuse cerebral volume loss. Vascular: No hyperdense vessel or unexpected calcification. Skull: Normal. Negative for fracture or focal lesion. Sinuses/Orbits: No acute finding. Other: Partially visualized tubing  within the posterior oropharynx, likely orogastric tube. IMPRESSION: 1. No acute intracranial findings. 2. Chronic microvascular ischemic change and cerebral volume loss. Electronically Signed   By: Duanne Guess D.O.   On: 07/09/21 11:39   CT Head Wo Contrast  Result Date: 06/17/2021 CLINICAL DATA:  Altered mental status. EXAM: CT HEAD WITHOUT CONTRAST TECHNIQUE: Contiguous axial images were obtained from the base of the skull through the vertex without intravenous contrast. COMPARISON:  05/19/2021 FINDINGS: Brain: No evidence of acute infarction, hemorrhage, extra-axial collection, ventriculomegaly, or mass effect. Generalized cerebral atrophy. Periventricular white matter low attenuation likely secondary to microangiopathy. Vascular: No hyperdense vessel or unexpected calcifications. Skull: Negative for fracture or focal lesion. Sinuses/Orbits: Visualized portions of the orbits are unremarkable. Visualized portions of the paranasal sinuses are unremarkable. Visualized portions of the mastoid air cells are unremarkable. Other: None. IMPRESSION: 1. No acute intracranial pathology. 2. Chronic microvascular disease and cerebral atrophy. Electronically Signed   By: Elige Ko M.D.   On: 06/17/2021 17:01  MR BRAIN WO CONTRAST  Result Date: 06/29/2021 CLINICAL DATA:  Anoxic brain injury.  Cardiac arrest at home EXAM: MRI HEAD WITHOUT CONTRAST TECHNIQUE: Multiplanar, multiecho pulse sequences of the brain and surrounding structures were obtained without intravenous contrast. COMPARISON:  CT head 06/25/2021 FINDINGS: Brain: Negative for acute infarct.  No areas of restricted diffusion Moderate atrophy. Atrophy most prominent in the frontal and temporal lobes. Ventricular enlargement consistent with atrophy. Periventricular deep white matter hyperintensity diffusely may represent chronic ischemia. No hemorrhage or mass identified. Vascular: Normal arterial flow voids Skull and upper cervical spine: No  focal skeletal lesion. Sinuses/Orbits: Mild mucosal edema paranasal sinuses. Negative orbit Other: None IMPRESSION: Negative for acute infarct. There is moderate atrophy. Periventricular white matter hyperintensity may represent chronic ischemia. Electronically Signed   By: Franchot Gallo M.D.   On: 06/29/2021 15:14   US Abdomen Complete  Result Date: 06/28/2021 CLINICAL DATA:  Acute kidney injury, elevated LFTs, cardiac arrest with unclear etiology EXAM: ABDOMEN ULTRASOUND COMPLETE COMPARISON:  None. FINDINGS: Gallbladder: Layering gallbladder sludge. No gallstones or gallbladder wall thickening. Common bile duct: Diameter: 3 mm Liver: No focal lesion identified. Within normal limits in parenchymal echogenicity. Portal vein is patent on color Doppler imaging with normal direction of blood flow towards the liver. IVC: No abnormality visualized. Pancreas: Visualized portion unremarkable. Spleen: Size and appearance within normal limits. Right Kidney: Length: 9.0 cm. Echogenicity within normal limits. No mass or hydronephrosis visualized. Left Kidney: Length: 8.8 cm. Echogenicity within normal limits. No mass or hydronephrosis visualized. Abdominal aorta: No aneurysm visualized. Other findings: None. IMPRESSION: Layering gallbladder sludge, without associated sonographic findings to suggest acute cholecystitis. Otherwise negative abdominal ultrasound. Electronically Signed   By: Julian Hy M.D.   On: 06/28/2021 00:11   DG CHEST PORT 1 VIEW  Result Date: 06/28/2021 CLINICAL DATA:  Intubated EXAM: PORTABLE CHEST 1 VIEW COMPARISON:  07/03/2021 FINDINGS: Endotracheal tube tip is about 4.4 cm superior to carina. Esophageal tube tip below the diaphragm but incompletely visualized. Clear lung fields. Normal cardiomediastinal silhouette. No pneumothorax. IMPRESSION: 1. Endotracheal tube tip about 4.4 cm superior to carina. 2. Clear lung fields Electronically Signed   By: Donavan Foil M.D.   On: 06/28/2021  19:16   DG Chest Portable 1 View  Result Date: 07/05/2021 CLINICAL DATA:  Cardiac arrest, status post CPR EXAM: PORTABLE CHEST 1 VIEW COMPARISON:  05/19/2021 FINDINGS: Endotracheal tube terminates 11 mm above the carina. Nasogastric tube terminates at the body of the stomach with side port below the gastroesophageal junction. External pacer/defibrillator. Normal heart size. Atherosclerosis in the transverse aorta. No pleural effusion or pneumothorax. Perihilar and slightly upper lobe predominant interstitial and airspace disease centrally. IMPRESSION: Interstitial and airspace disease is mild, suspicious for pulmonary edema. Early infection or aspiration could look similar. Endotracheal tube tip 11 mm above the carina. Consider retraction 3-4 cm. Aortic Atherosclerosis (ICD10-I70.0). Electronically Signed   By: Abigail Miyamoto M.D.   On: 07/04/2021 10:44   EEG adult  Result Date: 06/22/2021 Lora Havens, MD     07/14/2021  8:43 PM Patient Name: Donna Patrick MRN: [id number] Epilepsy Attending: Lora Havens Referring Physician/Provider: Omar Person, NP Date: 07/04/2021 Duration: 22.32 mins Patient history: 71 year old female who presented to the emergency department status post PEA cardiac arrest after she became unresponsive in the bathroom, witnessed by nephew, who started CPR, ROSC was achieved after 15 minutes, she received 1 amp of epi. EEG to evaluate for seizure Level of alertness:comatose AEDs during EEG study:  LEV, propofol Technical aspects: This EEG study was done with scalp electrodes positioned according to the 10-20 International system of electrode placement. Electrical activity was acquired at a sampling rate of 500Hz  and reviewed with a high frequency filter of 70Hz  and a low frequency filter of 1Hz . EEG data were recorded continuously and digitally stored. Description: EEG showed continuous generalized low amplitude 1-3 Hz delta slowing. EEG was reactive to noxious stimulation  and showed 5-6 Hz theta  slowing. Hyperventilation and photic stimulation were not performed.   ABNORMALITY - Continuous slow, generalized IMPRESSION: This study is suggestive of severe diffuse encephalopathy, nonspecific etiology but likely related to sedation, anoxic/hypoxic brain injury. No seizures or epileptiform discharges were seen throughout the recording. Priyanka Barbra Sarks   Overnight EEG with video  Result Date: 06/28/2021 Lora Havens, MD     06/29/2021  8:47 AM Patient Name: Donna Patrick MRN: [id number] Epilepsy Attending: Lora Havens Referring Physician/Provider: Omar Person, NP Duration: 07/13/2021 1803 to 06/28/2021 1803  Patient history: 70 year old female who presented to the emergency department status post PEA cardiac arrest after she became unresponsive in the bathroom, witnessed by nephew, who started CPR, ROSC was achieved after 15 minutes, she received 1 amp of epi. EEG to evaluate for seizure  Level of alertness:comatose  AEDs during EEG study: LEV  Technical aspects: This EEG study was done with scalp electrodes positioned according to the 10-20 International system of electrode placement. Electrical activity was acquired at a sampling rate of 500Hz  and reviewed with a high frequency filter of 70Hz  and a low frequency filter of 1Hz . EEG data were recorded continuously and digitally stored.  Description: EEG showed near continuous generalized 1-3 Hz delta slowing which at times appears sharply contoured and rhythmic. Intermittent 5-9 Hz theta-alpha activity. Hyperventilation and photic stimulation were not performed.    ABNORMALITY - Continuous slow, generalized  IMPRESSION: This study is suggestive of moderate to severe diffuse encephalopathy, nonspecific etiology but could be secondary to sedation, anoxic/hypoxic brain injury. No seizures or definite epileptiform discharges were seen throughout the recording.  Lora Havens   ECHOCARDIOGRAM COMPLETE  Result  Date: 06/23/2021    ECHOCARDIOGRAM REPORT   Patient Name:   Donna Patrick Date of Exam: 06/26/2021 Medical Rec #:  [id number]       Height:       62.0 in Accession #:    [id number]      Weight:       145.5 lb Date of Birth:  August 23, 1950        BSA:          1.670 m Patient Age:    89 years        BP:           89/37 mmHg Patient Gender: F               HR:           101 bpm. Exam Location:  Inpatient Procedure: 2D Echo, Cardiac Doppler and Color Doppler Indications:    Cardiac Arrest  History:        Patient has no prior history of Echocardiogram examinations.  Sonographer:    Merrie Roof RDCS Referring Phys: [place]  1. Left ventricular ejection fraction, by estimation, is 55 to 60%. The left ventricle has normal function. The left ventricle has no regional wall motion abnormalities. Left ventricular diastolic parameters are consistent with Grade I diastolic dysfunction (impaired relaxation).  2. Right ventricular systolic function is normal. The right ventricular size is normal. There is moderately elevated pulmonary artery systolic pressure. The estimated right ventricular systolic pressure is 0000000 mmHg.  3. The mitral valve is normal in structure. No evidence of mitral valve regurgitation. No evidence of mitral stenosis.  4. The aortic valve is tricuspid. Aortic valve regurgitation is not visualized. No aortic stenosis is present.  5. The inferior vena cava is dilated in size with <50% respiratory variability, suggesting right atrial pressure of 15 mmHg.  6. A small pericardial effusion is present. FINDINGS  Left Ventricle: Left ventricular ejection fraction, by estimation, is 55 to 60%. The left ventricle has normal function. The left ventricle has no regional wall motion abnormalities. The left ventricular internal cavity size was normal in size. There is  no left ventricular hypertrophy. Left ventricular diastolic parameters are consistent with Grade I diastolic dysfunction  (impaired relaxation). Right Ventricle: The right ventricular size is normal. No increase in right ventricular wall thickness. Right ventricular systolic function is normal. There is moderately elevated pulmonary artery systolic pressure. The tricuspid regurgitant velocity is 3.06 m/s, and with an assumed right atrial pressure of 15 mmHg, the estimated right ventricular systolic pressure is 0000000 mmHg. Left Atrium: Left atrial size was normal in size. Right Atrium: Right atrial size was normal in size. Pericardium: A small pericardial effusion is present. Mitral Valve: The mitral valve is normal in structure. No evidence of mitral valve regurgitation. No evidence of mitral valve stenosis. Tricuspid Valve: The tricuspid valve is normal in structure. Tricuspid valve regurgitation is not demonstrated. Aortic Valve: The aortic valve is tricuspid. Aortic valve regurgitation is not visualized. No aortic stenosis is present. Aortic valve mean gradient measures 4.0 mmHg. Aortic valve peak gradient measures 6.7 mmHg. Aortic valve area, by VTI measures 2.18 cm. Pulmonic Valve: The pulmonic valve was normal in structure. Pulmonic valve regurgitation is trivial. Aorta: The aortic root is normal in size and structure. Venous: The inferior vena cava is dilated in size with less than 50% respiratory variability, suggesting right atrial pressure of 15 mmHg. IAS/Shunts: No atrial level shunt detected by color flow Doppler.  LEFT VENTRICLE PLAX 2D LVIDd:         3.80 cm   Diastology LVIDs:         2.30 cm   LV e' medial:    6.53 cm/s LV PW:         0.90 cm   LV E/e' medial:  10.1 LV IVS:        0.90 cm   LV e' lateral:   6.31 cm/s LVOT diam:     2.10 cm   LV E/e' lateral: 10.5 LV SV:         43 LV SV Index:   26 LVOT Area:     3.46 cm  RIGHT VENTRICLE             IVC RV Basal diam:  3.60 cm     IVC diam: 2.40 cm RV S prime:     14.10 cm/s TAPSE (M-mode): 1.3 cm LEFT ATRIUM           Index        RIGHT ATRIUM          Index LA diam:       2.30 cm 1.38 cm/m   RA Area:     9.72 cm LA Vol (A4C): 19.8 ml 11.86 ml/m  RA Volume:   18.90 ml 11.32 ml/m  AORTIC VALVE AV Area (Vmax):    2.37 cm AV Area (Vmean):   2.29 cm AV Area (VTI):     2.18 cm AV Vmax:           129.00 cm/s AV Vmean:          86.100 cm/s AV VTI:            0.197 m AV Peak Grad:      6.7 mmHg AV Mean Grad:      4.0 mmHg LVOT Vmax:         88.30 cm/s LVOT Vmean:        57.000 cm/s LVOT VTI:          0.124 m LVOT/AV VTI ratio: 0.63  AORTA Ao Root diam: 2.50 cm Ao Asc diam:  3.20 cm MITRAL VALVE               TRICUSPID VALVE MV Area (PHT): 4.63 cm    TR Peak grad:   37.5 mmHg MV Decel Time: 164 msec    TR Vmax:        306.00 cm/s MV E velocity: 66.00 cm/s MV A velocity: 96.80 cm/s  SHUNTS MV E/A ratio:  0.68        Systemic VTI:  0.12 m                            Systemic Diam: 2.10 cm Dalton McleanMD Electronically signed by Franki Monte Signature Date/Time: 07/08/2021/7:10:43 PM    Final    VAS [place] LOWER EXTREMITY VENOUS (DVT)  Result Date: 06/28/2021  Lower Venous DVT Study Patient Name:  Donna Patrick  Date of Exam:   06/23/2021 Medical Rec #: [id number]        Accession #:    [id number] Date of Birth: Oct 09, 1950         Patient Gender: F Patient Age:   42 years Exam Location:  [facility] Procedure:      VAS [place] LOWER EXTREMITY VENOUS (DVT) Referring Phys: Hayden Pedro --------------------------------------------------------------------------------  Indications: Edema.  Comparison Study: no prior Performing Technologist: Archie Patten RVS  Examination Guidelines: A complete evaluation includes B-mode imaging, spectral Doppler, color Doppler, and power Doppler as needed of all accessible portions of each vessel. Bilateral testing is considered an integral part of a complete examination. Limited examinations for reoccurring indications may be performed as noted. The reflux portion of the exam is performed with the patient in reverse Trendelenburg.   +---------+---------------+---------+-----------+----------+--------------+ RIGHT    CompressibilityPhasicitySpontaneityPropertiesThrombus Aging +---------+---------------+---------+-----------+----------+--------------+ CFV      Full           Yes      No                                  +---------+---------------+---------+-----------+----------+--------------+ SFJ      Full                                                        +---------+---------------+---------+-----------+----------+--------------+ FV Prox  Full                                                        +---------+---------------+---------+-----------+----------+--------------+  FV Mid   Full                                                        +---------+---------------+---------+-----------+----------+--------------+ FV DistalFull                                                        +---------+---------------+---------+-----------+----------+--------------+ PFV      Full                                                        +---------+---------------+---------+-----------+----------+--------------+ POP      Full           Yes      Yes                                 +---------+---------------+---------+-----------+----------+--------------+ PTV      Full                                                        +---------+---------------+---------+-----------+----------+--------------+ PERO     Full                                                        +---------+---------------+---------+-----------+----------+--------------+   +---------+---------------+---------+-----------+----------+--------------+ LEFT     CompressibilityPhasicitySpontaneityPropertiesThrombus Aging +---------+---------------+---------+-----------+----------+--------------+ CFV      Full           Yes      Yes                                  +---------+---------------+---------+-----------+----------+--------------+ SFJ      Full                                                        +---------+---------------+---------+-----------+----------+--------------+ FV Prox  Full                                                        +---------+---------------+---------+-----------+----------+--------------+ FV Mid   Full                                                        +---------+---------------+---------+-----------+----------+--------------+  FV DistalFull                                                        +---------+---------------+---------+-----------+----------+--------------+ PFV      Full                                                        +---------+---------------+---------+-----------+----------+--------------+ POP      Full           Yes      Yes                                 +---------+---------------+---------+-----------+----------+--------------+ PTV      Full                                                        +---------+---------------+---------+-----------+----------+--------------+ PERO     Full                                                        +---------+---------------+---------+-----------+----------+--------------+     Summary: BILATERAL: - No evidence of deep vein thrombosis seen in the lower extremities, bilaterally. -No evidence of popliteal cyst, bilaterally.   *See table(s) above for measurements and observations. Electronically signed by Monica Martinez MD on 06/28/2021 at 3:27:15 PM.    Final      Subjective: She is alert, no very interactive  Discharge Exam: Vitals:   07/06/21 0333 07/06/21 0755  BP: 132/71 (!) 153/73  Pulse: 70 75  Resp: 17 19  Temp: 98.4 F (36.9 C) 98.7 F (37.1 C)  SpO2: 100% 100%     General: Pt is alert, awake, not in acute distress Cardiovascular: RRR, S1/S2 +, no rubs, no gallops Respiratory: CTA  bilaterally, no wheezing, no rhonchi Abdominal: Soft, NT, ND, bowel sounds + Extremities: no edema, no cyanosis    The results of significant diagnostics from this hospitalization (including imaging, microbiology, ancillary and laboratory) are listed below for reference.     Microbiology: Recent Results (from the past 240 hour(s))  Culture, blood (routine x 2)     Status: None   Collection Time: 07/12/2021 10:23 AM   Specimen: BLOOD  Result Value Ref Range Status   Specimen Description BLOOD LEFT ANTECUBITAL  Final   Special Requests   Final    BOTTLES DRAWN AEROBIC AND ANAEROBIC Blood Culture adequate volume   Culture   Final    NO GROWTH 5 DAYS Performed at [facility], [address] [place], [place] [address]    Report Status 07/02/2021 FINAL  Final  Resp Panel by RT-PCR (Flu A&B, Covid) Nasopharyngeal Swab     Status: None   Collection Time: 06/30/2021 10:36 AM   Specimen: Nasopharyngeal Swab; Nasopharyngeal(NP) swabs in vial  transport medium  Result Value Ref Range Status   SARS Coronavirus 2 by RT PCR NEGATIVE NEGATIVE Final    Comment: (NOTE) SARS-CoV-2 target nucleic acids are NOT DETECTED.  The SARS-CoV-2 RNA is generally detectable in upper respiratory specimens during the acute phase of infection. The lowest concentration of SARS-CoV-2 viral copies this assay can detect is 138 copies/mL. A negative result does not preclude SARS-Cov-2 infection and should not be used as the sole basis for treatment or other patient management decisions. A negative result may occur with  improper specimen collection/handling, submission of specimen other than nasopharyngeal swab, presence of viral mutation(s) within the areas targeted by this assay, and inadequate number of viral copies(<138 copies/mL). A negative result must be combined with clinical observations, patient history, and epidemiological information. The expected result is Negative.  Fact Sheet for Patients:   EntrepreneurPulse.com.au  Fact Sheet for Healthcare Providers:  IncredibleEmployment.be  This test is no t yet approved or cleared by the [place] FDA and  has been authorized for detection and/or diagnosis of SARS-CoV-2 by FDA under an Emergency Use Authorization (EUA). This EUA will remain  in effect (meaning this test can be used) for the duration of the COVID-19 declaration under Section 564(b)(1) of the Act, 21 U.S.C.section 360bbb-3(b)(1), unless the authorization is terminated  or revoked sooner.       Influenza A by PCR NEGATIVE NEGATIVE Final   Influenza B by PCR NEGATIVE NEGATIVE Final    Comment: (NOTE) The Xpert Xpress SARS-CoV-2/FLU/RSV plus assay is intended as an aid in the diagnosis of influenza from Nasopharyngeal swab specimens and should not be used as a sole basis for treatment. Nasal washings and aspirates are unacceptable for Xpert Xpress SARS-CoV-2/FLU/RSV testing.  Fact Sheet for Patients: EntrepreneurPulse.com.au  Fact Sheet for Healthcare Providers: IncredibleEmployment.be  This test is not yet approved or cleared by the [place] FDA and has been authorized for detection and/or diagnosis of SARS-CoV-2 by FDA under an Emergency Use Authorization (EUA). This EUA will remain in effect (meaning this test can be used) for the duration of the COVID-19 declaration under Section 564(b)(1) of the Act, 21 U.S.C. section 360bbb-3(b)(1), unless the authorization is terminated or revoked.  Performed at [facility], [place] [address], [place], [place] [address]   Culture, blood (routine x 2)     Status: None   Collection Time: 07/10/2021 11:02 AM   Specimen: BLOOD RIGHT HAND  Result Value Ref Range Status   Specimen Description BLOOD RIGHT HAND  Final   Special Requests   Final    BOTTLES DRAWN AEROBIC AND ANAEROBIC Blood Culture results may not be optimal due to an  inadequate volume of blood received in culture bottles   Culture   Final    NO GROWTH 5 DAYS Performed at [facility], [place] [address], [place], [place] [address]    Report Status 07/02/2021 FINAL  Final  Respiratory (~20 pathogens) panel by PCR     Status: None   Collection Time: 07/10/2021  4:10 PM   Specimen: Nasopharyngeal Swab; Respiratory  Result Value Ref Range Status   Adenovirus NOT DETECTED NOT DETECTED Final   Coronavirus 229E NOT DETECTED NOT DETECTED Final    Comment: (NOTE) The Coronavirus on the Respiratory Panel, DOES NOT test for the novel  Coronavirus (2019 nCoV)    Coronavirus HKU1 NOT DETECTED NOT DETECTED Final   Coronavirus NL63 NOT DETECTED NOT DETECTED Final   Coronavirus OC43 NOT DETECTED NOT DETECTED  Final   Metapneumovirus NOT DETECTED NOT DETECTED Final   Rhinovirus / Enterovirus NOT DETECTED NOT DETECTED Final   Influenza A NOT DETECTED NOT DETECTED Final   Influenza B NOT DETECTED NOT DETECTED Final   Parainfluenza Virus 1 NOT DETECTED NOT DETECTED Final   Parainfluenza Virus 2 NOT DETECTED NOT DETECTED Final   Parainfluenza Virus 3 NOT DETECTED NOT DETECTED Final   Parainfluenza Virus 4 NOT DETECTED NOT DETECTED Final   Respiratory Syncytial Virus NOT DETECTED NOT DETECTED Final   Bordetella pertussis NOT DETECTED NOT DETECTED Final   Bordetella Parapertussis NOT DETECTED NOT DETECTED Final   Chlamydophila pneumoniae NOT DETECTED NOT DETECTED Final   Mycoplasma pneumoniae NOT DETECTED NOT DETECTED Final    Comment: Performed at [facility], [place] [address], [place], [place] [address]  MRSA Next Gen by PCR, Nasal     Status: Abnormal   Collection Time: 06/28/21 11:41 AM   Specimen: Nasal Mucosa; Nasal Swab  Result Value Ref Range Status   MRSA by PCR Next Gen DETECTED (A) NOT DETECTED Final    Comment: RESULT CALLED TO, READ BACK BY AND VERIFIED WITH: Annetta Maw RN, AT 06/28/21 D. Victoriano Lain (NOTE) The GeneXpert MRSA Assay (FDA approved for  NASAL specimens only), is one component of a comprehensive MRSA colonization surveillance program. It is not intended to diagnose MRSA infection nor to guide or monitor treatment for MRSA infections. Test performance is not FDA approved in patients less than 9 years old. Performed at [facility], [place] [address], [place], [place] [address]   SARS CORONAVIRUS 2 (TAT 6-24 HRS) Nasopharyngeal Nasopharyngeal Swab     Status: None   Collection Time: 07/03/21  4:43 PM   Specimen: Nasopharyngeal Swab  Result Value Ref Range Status   SARS Coronavirus 2 NEGATIVE NEGATIVE Final    Comment: (NOTE) SARS-CoV-2 target nucleic acids are NOT DETECTED.  The SARS-CoV-2 RNA is generally detectable in upper and lower respiratory specimens during the acute phase of infection. Negative results do not preclude SARS-CoV-2 infection, do not rule out co-infections with other pathogens, and should not be used as the sole basis for treatment or other patient management decisions. Negative results must be combined with clinical observations, patient history, and epidemiological information. The expected result is Negative.  Fact Sheet for Patients: SugarRoll.be  Fact Sheet for Healthcare Providers: https://www.woods-mathews.com/  This test is not yet approved or cleared by the [place] FDA and  has been authorized for detection and/or diagnosis of SARS-CoV-2 by FDA under an Emergency Use Authorization (EUA). This EUA will remain  in effect (meaning this test can be used) for the duration of the COVID-19 declaration under Se ction 564(b)(1) of the Act, 21 U.S.C. section 360bbb-3(b)(1), unless the authorization is terminated or revoked sooner.  Performed at [facility], [place] [address] [place], [place] [address]      Labs: BNP (last 3 results) No results for input(s): BNP in the last 8760 hours. Basic Metabolic Panel: Recent Labs  Lab  07/01/21 0311 07/02/21 0232 07/03/21 0206 07/04/21 0207 07/05/21 0115 07/05/21 1056  NA 133* 135 135 133* 136 136  K 3.5 3.7 3.0* 3.3* 3.8 4.7  CL 95* 99 100 99 102  --   CO2 29 25 25 25 26   --   GLUCOSE 102* 103* 98 117* 115*  --   BUN 6* 10 11 12 12   --   CREATININE 0.57 0.65 0.68 0.67 0.66  --  CALCIUM 8.5* 9.0 8.8* 8.8* 8.9  --   MG 2.2  --   --  2.2 2.2  --   PHOS 2.8  --   --   --   --   --    Liver Function Tests: Recent Labs  Lab 07/03/21 0206 07/04/21 0207  AST 30 22  ALT 35 29  ALKPHOS 115 113  BILITOT 0.7 0.4  PROT 5.6* 5.8*  ALBUMIN 3.0* 3.1*   No results for input(s): LIPASE, AMYLASE in the last 168 hours. Recent Labs  Lab 07/04/21 1012  AMMONIA 18   CBC: Recent Labs  Lab 07/01/21 0311 07/03/21 0206 07/05/21 1056 07/05/21 1217  WBC 7.7 8.8  --  10.4  HGB 11.4* 11.9* 12.2 12.5  HCT 33.1* 35.2* 36.0 37.1  MCV 86.2 88.0  --  89.0  PLT 147* 252  --  340   Cardiac Enzymes: No results for input(s): CKTOTAL, CKMB, CKMBINDEX, TROPONINI in the last 168 hours. BNP: Invalid input(s): POCBNP CBG: Recent Labs  Lab 07/05/21 1207 07/05/21 1708 07/05/21 2100 07/06/21 0757 07/06/21 1146  GLUCAP 113* 124* 98 100* 127*   D-Dimer No results for input(s): DDIMER in the last 72 hours. Hgb A1c No results for input(s): HGBA1C in the last 72 hours. Lipid Profile No results for input(s): CHOL, HDL, LDLCALC, TRIG, CHOLHDL, LDLDIRECT in the last 72 hours. Thyroid function studies Recent Labs    07/04/21 1012  TSH 29.616*  T3FREE 1.9*   Anemia work up Recent Labs    07/04/21 1012  VITAMINB12 2,421*   Urinalysis    Component Value Date/Time   COLORURINE YELLOW 06/26/2021 1022   APPEARANCEUR CLEAR 06/29/2021 1022   LABSPEC 1.013 06/21/2021 1022   PHURINE 5.0 06/26/2021 1022   GLUCOSEU NEGATIVE 06/30/2021 1022   GLUCOSEU NEGATIVE 03/02/2021 1426   HGBUR NEGATIVE 07/10/2021 1022   BILIRUBINUR NEGATIVE 07/15/2021 1022   KETONESUR NEGATIVE  06/24/2021 1022   PROTEINUR NEGATIVE 07/15/2021 1022   UROBILINOGEN 0.2 03/02/2021 1426   NITRITE NEGATIVE 06/30/2021 1022   LEUKOCYTESUR NEGATIVE 07/10/2021 1022   Sepsis Labs Invalid input(s): PROCALCITONIN,  WBC,  LACTICIDVEN Microbiology Recent Results (from the past 240 hour(s))  Culture, blood (routine x 2)     Status: None   Collection Time: 06/26/2021 10:23 AM   Specimen: BLOOD  Result Value Ref Range Status   Specimen Description BLOOD LEFT ANTECUBITAL  Final   Special Requests   Final    BOTTLES DRAWN AEROBIC AND ANAEROBIC Blood Culture adequate volume   Culture   Final    NO GROWTH 5 DAYS Performed at [facility], [place] [address] [place], [place] [address]    Report Status 07/02/2021 FINAL  Final  Resp Panel by RT-PCR (Flu A&B, Covid) Nasopharyngeal Swab     Status: None   Collection Time: 06/25/2021 10:36 AM   Specimen: Nasopharyngeal Swab; Nasopharyngeal(NP) swabs in vial transport medium  Result Value Ref Range Status   SARS Coronavirus 2 by RT PCR NEGATIVE NEGATIVE Final    Comment: (NOTE) SARS-CoV-2 target nucleic acids are NOT DETECTED.  The SARS-CoV-2 RNA is generally detectable in upper respiratory specimens during the acute phase of infection. The lowest concentration of SARS-CoV-2 viral copies this assay can detect is 138 copies/mL. A negative result does not preclude SARS-Cov-2 infection and should not be used as the sole basis for treatment or other patient management decisions. A negative result may occur with  improper specimen collection/handling, submission of specimen other than nasopharyngeal  swab, presence of viral mutation(s) within the areas targeted by this assay, and inadequate number of viral copies(<138 copies/mL). A negative result must be combined with clinical observations, patient history, and epidemiological information. The expected result is Negative.  Fact Sheet for Patients:  EntrepreneurPulse.com.au  Fact  Sheet for Healthcare Providers:  IncredibleEmployment.be  This test is no t yet approved or cleared by the [place] FDA and  has been authorized for detection and/or diagnosis of SARS-CoV-2 by FDA under an Emergency Use Authorization (EUA). This EUA will remain  in effect (meaning this test can be used) for the duration of the COVID-19 declaration under Section 564(b)(1) of the Act, 21 U.S.C.section 360bbb-3(b)(1), unless the authorization is terminated  or revoked sooner.       Influenza A by PCR NEGATIVE NEGATIVE Final   Influenza B by PCR NEGATIVE NEGATIVE Final    Comment: (NOTE) The Xpert Xpress SARS-CoV-2/FLU/RSV plus assay is intended as an aid in the diagnosis of influenza from Nasopharyngeal swab specimens and should not be used as a sole basis for treatment. Nasal washings and aspirates are unacceptable for Xpert Xpress SARS-CoV-2/FLU/RSV testing.  Fact Sheet for Patients: EntrepreneurPulse.com.au  Fact Sheet for Healthcare Providers: IncredibleEmployment.be  This test is not yet approved or cleared by the [place] FDA and has been authorized for detection and/or diagnosis of SARS-CoV-2 by FDA under an Emergency Use Authorization (EUA). This EUA will remain in effect (meaning this test can be used) for the duration of the COVID-19 declaration under Section 564(b)(1) of the Act, 21 U.S.C. section 360bbb-3(b)(1), unless the authorization is terminated or revoked.  Performed at [facility], [place] [address] [place], [place] [address]   Culture, blood (routine x 2)     Status: None   Collection Time: 07/08/2021 11:02 AM   Specimen: BLOOD RIGHT HAND  Result Value Ref Range Status   Specimen Description BLOOD RIGHT HAND  Final   Special Requests   Final    BOTTLES DRAWN AEROBIC AND ANAEROBIC Blood Culture results may not be optimal due to an inadequate volume of blood received in culture bottles    Culture   Final    NO GROWTH 5 DAYS Performed at [facility], [place] [address] [place],  [address]    Report Status 07/02/2021 FINAL  Final  Respiratory (~20 pathogens) panel by PCR     Status: None   Collection Time: 06/25/2021  4:10 PM   Specimen: Nasopharyngeal Swab; Respiratory  Result Value Ref Range Status   Adenovirus NOT DETECTED NOT DETECTED Final   Coronavirus 229E NOT DETECTED NOT DETECTED Final    Comment: (NOTE) The Coronavirus on the Respiratory Panel, DOES NOT test for the novel  Coronavirus (2019 nCoV)    Coronavirus HKU1 NOT DETECTED NOT DETECTED Final   Coronavirus NL63 NOT DETECTED NOT DETECTED Final   Coronavirus OC43 NOT DETECTED NOT DETECTED Final   Metapneumovirus NOT DETECTED NOT DETECTED Final   Rhinovirus / Enterovirus NOT DETECTED NOT DETECTED Final   Influenza A NOT DETECTED NOT DETECTED Final   Influenza B NOT DETECTED NOT DETECTED Final   Parainfluenza Virus 1 NOT DETECTED NOT DETECTED Final   Parainfluenza Virus 2 NOT DETECTED NOT DETECTED Final   Parainfluenza Virus 3 NOT DETECTED NOT DETECTED Final   Parainfluenza Virus 4 NOT DETECTED NOT DETECTED Final   Respiratory Syncytial Virus NOT DETECTED NOT DETECTED Final   Bordetella pertussis NOT DETECTED NOT DETECTED Final   Bordetella Parapertussis NOT DETECTED  NOT DETECTED Final   Chlamydophila pneumoniae NOT DETECTED NOT DETECTED Final   Mycoplasma pneumoniae NOT DETECTED NOT DETECTED Final    Comment: Performed at [facility], [place] [address], [place], [place] [address]  MRSA Next Gen by PCR, Nasal     Status: Abnormal   Collection Time: 06/28/21 11:41 AM   Specimen: Nasal Mucosa; Nasal Swab  Result Value Ref Range Status   MRSA by PCR Next Gen DETECTED (A) NOT DETECTED Final    Comment: RESULT CALLED TO, READ BACK BY AND VERIFIED WITH: Annetta Maw RN, AT 06/28/21 D. Victoriano Lain (NOTE) The GeneXpert MRSA Assay (FDA approved for NASAL specimens only), is one component of a  comprehensive MRSA colonization surveillance program. It is not intended to diagnose MRSA infection nor to guide or monitor treatment for MRSA infections. Test performance is not FDA approved in patients less than 20 years old. Performed at [facility], [place] [address], [place], [place] [address]   SARS CORONAVIRUS 2 (TAT 6-24 HRS) Nasopharyngeal Nasopharyngeal Swab     Status: None   Collection Time: 07/03/21  4:43 PM   Specimen: Nasopharyngeal Swab  Result Value Ref Range Status   SARS Coronavirus 2 NEGATIVE NEGATIVE Final    Comment: (NOTE) SARS-CoV-2 target nucleic acids are NOT DETECTED.  The SARS-CoV-2 RNA is generally detectable in upper and lower respiratory specimens during the acute phase of infection. Negative results do not preclude SARS-CoV-2 infection, do not rule out co-infections with other pathogens, and should not be used as the sole basis for treatment or other patient management decisions. Negative results must be combined with clinical observations, patient history, and epidemiological information. The expected result is Negative.  Fact Sheet for Patients: SugarRoll.be  Fact Sheet for Healthcare Providers: https://www.woods-mathews.com/  This test is not yet approved or cleared by the [place] FDA and  has been authorized for detection and/or diagnosis of SARS-CoV-2 by FDA under an Emergency Use Authorization (EUA). This EUA will remain  in effect (meaning this test can be used) for the duration of the COVID-19 declaration under Se ction 564(b)(1) of the Act, 21 U.S.C. section 360bbb-3(b)(1), unless the authorization is terminated or revoked sooner.  Performed at [facility], [place] [address], [place], [place] [address]      Time coordinating discharge: 40 minutes  SIGNED:   Elmarie Shiley, MD  [facility]

## 2021-07-06 NOTE — Progress Notes (Signed)
Brief Palliative Medicine Progress Note:  PMT consult received and chart reviewed. Full note to follow:  Recommendations/Plan: Continue current medical treatment Continue DNR/DNI as previously documented Scheduled in person meeting with patient's son tomorrow 11/19 at 11am PMT will continue to follow and support holistically  Thank you for allowing PMT to assist in the care of this patient.  Sherman Donaldson M. Katrinka [facility] Palliative Medicine Team Team Phone: [phone] NO CHARGE

## 2021-07-06 NOTE — TOC Progression Note (Signed)
Transition of Care [facility]) - Progression Note    Patient Details  Name: Donna Patrick MRN: [id number] Date of Birth: September 18, 1950  [facility] of [facility] CM/SW Contact  Jimmy Picket, [place] Phone Number: 07/06/2021, 3:21 PM  Clinical Narrative:     Pt will not be discharged today. [facility] not do weekend admissions. The earliest [facility] would be able to take the pt is Monday. Pt does not require insurance auth. Pt may need a repeat covid test on Monday.        Expected Discharge Plan and Services           Expected Discharge Date: 07/06/21                                     Social Determinants of Health (SDOH) Interventions    Readmission Risk Interventions No flowsheet data found.  Jimmy Picket, LCSW Clinical Social Worker

## 2021-07-06 NOTE — Consult Note (Signed)
Consultation Note Date: 07/06/2021   Patient Name: Donna Patrick  DOB: 12-25-1950  MRN: [id number]  Age / Sex: 70 y.o., female  PCP: Caren Macadam, MD Referring Physician: Elmarie Shiley, MD  Reason for Consultation: Establishing goals of care, "Discussed Most form, diseases trjectory, ...."  HPI/Patient Profile: 70 y.o. female  with past medical history of dementia presented to ED on 07/08/2021 from home after witnessed cardiac arrest. CPR was initiated by family and patient was found in PEA on EMS arrival. 15 minutes of CPR was performed before ROSC; however, on arrival to ED there was minimal neurological functioning and she was intubated. Patient was admitted on 07/17/2021 with PEA cardiac arrest with unclear etiology, respiratory insufficiency in setting of cardiac arrest, encephalopathy, AKI. Patient was extubated 06/30/21.  Clinical Assessment and Goals of Care: Discussed patient with Dr. Tyrell Antonio.  I have reviewed medical records including EPIC notes, labs, and imaging. Received report from primary RN - no acute concerns. RN reports patient remains minimally interactive with poor PO intake.   Went to visit patient at bedside - no family/visitors present. Patient was lying in bed awake, alert, disoriented, and able to participate in minimal conversation. No signs or non-verbal gestures of pain or discomfort noted. No respiratory distress, increased work of breathing, or secretions noted. She denies pain. During my short visit she fell asleep and stopped responding to questions.  Attempted to met with son/Rob  to discuss diagnosis, prognosis, GOC, EOL wishes, disposition, and options.  I introduced Palliative Medicine as specialized medical care for people living with serious illness. It focuses on providing relief from the symptoms and stress of a serious illness. The goal is to improve quality of  life for both the patient and the family.  Rob requests to meet in person tomorrow 11/19 - time scheduled for 11am. He understands if patient does not discharge today she may have to remain in house until Monday.   Questions and concerns were addressed. The patient/family was encouraged to call with questions and/or concerns. PMT card was provided.    Primary Decision Maker: NEXT OF KIN - son/Rob Jacobsen    SUMMARY OF RECOMMENDATIONS   Continue current medical treatment Continue DNR/DNI as previously documented Scheduled in person meeting with patient's son tomorrow 11/19 at 11am PMT will continue to follow and support holistically   Code Status/Advance Care Planning: DNR   Palliative Prophylaxis:  Aspiration, Bowel Regimen, Delirium Protocol, Frequent Pain Assessment, Oral Care, Palliative Wound Care, and Turn Reposition  Additional Recommendations (Limitations, Scope, Preferences): Full Scope Treatment and No Tracheostomy  Psycho-social/Spiritual:  Desire for further Chaplaincy support:no Created space and opportunity for patient and family to express thoughts and feelings regarding patient's current medical situation.  Emotional support and therapeutic listening provided.  Prognosis:  Poor in the setting of advanced age and dementia  Discharge Planning: To Be Determined      Primary Diagnoses: Present on Admission:  Cardiac arrest [facility])   I have reviewed the medical record, interviewed the patient and  family, and examined the patient. The following aspects are pertinent.  Past Medical History:  Diagnosis Date   Dementia ([place])    diagnosed 8-10 years ago   Hyperlipidemia    Incontinence    Thyroid disease    Social History   Socioeconomic History   Marital status: Single    Spouse name: Not on file   Number of children: 1   Years of education: 57   Highest education level: 12th grade  Occupational History   Not on file  Tobacco Use   Smoking status:  Former    Passive exposure: Past   Smokeless tobacco: Never  Vaping Use   Vaping Use: Never used  Substance and Sexual Activity   Alcohol use: Never   Drug use: Never   Sexual activity: Not Currently  Other Topics Concern   Not on file  Social History Narrative   Caffeine use:  None   Right handed   Lives with family   Social Determinants of Health   Financial Resource Strain: Low Risk    Difficulty of Paying Living Expenses: Not hard at all  Food Insecurity: No Food Insecurity   Worried About Charity fundraiser in the Last Year: Never true   [place] in the Last Year: Never true  Transportation Needs: No Transportation Needs   Lack of Transportation (Medical): No   Lack of Transportation (Non-Medical): No  Physical Activity: Sufficiently Active   Days of Exercise per Week: 7 days   Minutes of Exercise per Session: 30 min  Stress: No Stress Concern Present   Feeling of Stress : Only a little  Social Connections: Socially Isolated   Frequency of Communication with Friends and Family: More than three times a week   Frequency of Social Gatherings with Friends and Family: More than three times a week   Attends Religious Services: Never   Marine scientist or Organizations: No   Attends Music therapist: Never   Marital Status: Never married   Family History  Problem Relation Age of Onset   Dementia Mother    Cancer Father    Dementia Brother    Parkinson's disease Brother    Scheduled Meds:  amLODipine  5 mg Oral Daily   Chlorhexidine Gluconate Cloth  6 each Topical Q0600   donepezil  5 mg Oral QHS   feeding supplement  237 mL Oral TID BM   heparin  5,000 Units Subcutaneous Q8H   insulin aspart  0-15 Units Subcutaneous TID WC & HS   levothyroxine  125 mcg Oral Daily   memantine  5 mg Oral BID   metoprolol succinate  12.5 mg Oral Daily   multivitamin with minerals  1 tablet Oral Daily   polyethylene glycol  17 g Oral Daily   potassium  chloride  40 mEq Oral Daily   risperiDONE  0.25 mg Oral QHS   Continuous Infusions:  sodium chloride 100 mL/hr at 07/06/21 0050   thiamine injection     PRN Meds:.hydrALAZINE Medications Prior to Admission:  Prior to Admission medications   Medication Sig Start Date End Date Taking? Authorizing Provider  cholecalciferol (VITAMIN D3) 25 MCG (1000 UNIT) tablet Take 2,000 Units by mouth daily.   Yes [provider]  donepezil (ARICEPT) 5 MG tablet TAKE 1 TABLET(5 MG) BY MOUTH AT BEDTIME Patient taking differently: Take 5 mg by mouth at bedtime. 06/04/21  Yes Koberlein, Steele Berg, MD  hydrOXYzine (ATARAX/VISTARIL) 25 MG tablet Take  1 tablet (25 mg total) by mouth 3 (three) times daily as needed. Patient taking differently: Take 25 mg by mouth 3 (three) times daily as needed (agitation). 04/03/21  Yes Sater, Nanine Means, MD  levothyroxine (SYNTHROID) 100 MCG tablet Take 1 tablet (100 mcg total) by mouth daily. Patient taking differently: Take 100 mcg by mouth daily before breakfast. 03/28/21  Yes Koberlein, Junell C, MD  LORazepam (ATIVAN) 0.5 MG tablet Take 1 tablet (0.5 mg total) by mouth 2 (two) times daily as needed for anxiety. Patient taking differently: Take 0.25 mg by mouth 2 (two) times daily as needed for anxiety. 06/04/21  Yes Koberlein, Steele Berg, MD  meloxicam (MOBIC) 7.5 MG tablet Take 1 tablet (7.5 mg total) by mouth daily as needed for pain. 05/18/21  Yes Koberlein, Junell C, MD  memantine (NAMENDA) 10 MG tablet TAKE 1 TABLET(10 MG) BY MOUTH TWICE DAILY Patient taking differently: Take 10 mg by mouth 2 (two) times daily. 05/10/21  Yes Koberlein, Junell C, MD  oxybutynin (DITROPAN-XL) 10 MG 24 hr tablet Take 1 tablet (10 mg total) by mouth daily. 12/25/20  Yes Koberlein, Junell C, MD  potassium chloride (KLOR-CON) 10 MEQ tablet TAKE 2 TABLETS BY MOUTH TWICE DAILY FOR 2 DAYS THEN TAKE 1 TABLET BY MOUTH EVERY DAY THEREAFTER Patient taking differently: Take 10 mEq by mouth daily.  06/06/21  Yes Koberlein, Junell C, MD  risperiDONE (RISPERDAL) 1 MG tablet Take 1 tablet (1 mg total) by mouth 2 (two) times daily. 03/26/21  Yes Koberlein, Junell C, MD  simvastatin (ZOCOR) 40 MG tablet TAKE 1 TABLET(40 MG) BY MOUTH DAILY Patient taking differently: Take 40 mg by mouth daily. 06/06/21  Yes Koberlein, Steele Berg, MD  traZODone (DESYREL) 50 MG tablet Take 1 tablet (50 mg total) by mouth at bedtime as needed for sleep. 03/26/21  Yes Koberlein, Junell C, MD  triamcinolone ointment (KENALOG) 0.1 % Apply 1 application topically 2 (two) times daily as needed (rash). 01/18/21  Yes [provider]  trospium (SANCTURA) 20 MG tablet Take 1 tablet (20 mg total) by mouth 2 (two) times daily. 01/09/21  Yes Sater, Nanine Means, MD  amLODipine (NORVASC) 5 MG tablet Take 1 tablet (5 mg total) by mouth daily. 07/06/21   Regalado, Belkys A, MD  levothyroxine (SYNTHROID) 125 MCG tablet Take 1 tablet (125 mcg total) by mouth daily. 07/06/21   Regalado, Belkys A, MD  memantine (NAMENDA) 5 MG tablet Take 1 tablet (5 mg total) by mouth 2 (two) times daily. 07/05/21   Regalado, Belkys A, MD  metoprolol succinate (TOPROL-XL) 25 MG 24 hr tablet Take 0.5 tablets (12.5 mg total) by mouth daily. 07/06/21   Regalado, Jerald Kief A, MD  Multiple Vitamin (MULTIVITAMIN WITH MINERALS) TABS tablet Take 1 tablet by mouth daily. 07/06/21   Regalado, Belkys A, MD  polyethylene glycol (MIRALAX / GLYCOLAX) 17 g packet Take 17 g by mouth daily. 07/06/21   Regalado, Belkys A, MD  potassium chloride (KLOR-CON) 20 MEQ packet Take 20 mEq by mouth daily. 07/06/21   Regalado, Belkys A, MD  risperiDONE (RISPERDAL) 1 MG tablet Take 0.5 tablets (0.5 mg total) by mouth at bedtime. 07/06/21 07/06/22  Regalado, Belkys A, MD  sulfamethoxazole-trimethoprim (BACTRIM DS) 800-160 MG tablet Take 1 tablet by mouth 2 (two) times daily. Patient not taking: No sig reported 03/26/21   Caren Macadam, MD   No Known Allergies Review of Systems   Unable to perform ROS: Dementia   Physical Exam Vitals and nursing note  reviewed.  Constitutional:      General: She is not in acute distress.    Appearance: She is ill-appearing.  Pulmonary:     Effort: No respiratory distress.  Skin:    General: Skin is warm and dry.  Neurological:     Mental Status: She is lethargic and disoriented.     Motor: Weakness present.  Psychiatric:        Mood and Affect: Affect is flat.        Speech: Speech is slurred.        Behavior: Behavior is slowed. Behavior is cooperative.        Cognition and Memory: Cognition is impaired.    Vital Signs: BP (!) 153/73 (BP Location: Right Arm)   Pulse 75   Temp 98.7 F (37.1 C) (Oral)   Resp 19   Ht $R'5\' 2"'is$  (1.575 m)   Wt 59 kg   SpO2 100%   BMI 23.79 kg/m  Pain Scale: Faces   Pain Score: Asleep   SpO2: SpO2: 100 % O2 Device:SpO2: 100 % O2 Flow Rate: .O2 Flow Rate (L/min): 2.5 L/min  IO: Intake/output summary:  Intake/Output Summary (Last 24 hours) at 07/06/2021 1604 Last data filed at 07/06/2021 1300 Gross per 24 hour  Intake 2546.84 ml  Output 2900 ml  Net -353.16 ml    LBM: Last BM Date: 07/05/21 Baseline Weight: Weight: 67.6 kg Most recent weight: Weight: 59 kg     Palliative Assessment/Data: PPS 20%     Time In: 1530 Time Out: 1605 Time Total: 35 minutes  Greater than 50%  of this time was spent counseling and coordinating care related to the above assessment and plan.  Signed by: Lin Landsman, NP   Please contact Palliative Medicine Team phone at [phone] for questions and concerns.  For individual provider: See Shea Evans

## 2021-07-07 DIAGNOSIS — I469 Cardiac arrest, cause unspecified: Secondary | ICD-10-CM | POA: Diagnosis not present

## 2021-07-07 DIAGNOSIS — Z711 Person with feared health complaint in whom no diagnosis is made: Secondary | ICD-10-CM

## 2021-07-07 DIAGNOSIS — J9601 Acute respiratory failure with hypoxia: Secondary | ICD-10-CM | POA: Diagnosis not present

## 2021-07-07 DIAGNOSIS — N179 Acute kidney failure, unspecified: Secondary | ICD-10-CM | POA: Diagnosis not present

## 2021-07-07 DIAGNOSIS — Z7189 Other specified counseling: Secondary | ICD-10-CM

## 2021-07-07 DIAGNOSIS — R451 Restlessness and agitation: Secondary | ICD-10-CM | POA: Diagnosis not present

## 2021-07-07 LAB — GLUCOSE, CAPILLARY
Glucose-Capillary: 105 mg/dL — ABNORMAL HIGH (ref 70–99)
Glucose-Capillary: 103 mg/dL — ABNORMAL HIGH (ref 70–99)
Glucose-Capillary: 102 mg/dL — ABNORMAL HIGH (ref 70–99)
Glucose-Capillary: 105 mg/dL — ABNORMAL HIGH (ref 70–99)

## 2021-07-07 NOTE — Progress Notes (Signed)
Daily Progress Note   Patient Name: Donna Patrick       Date: 07/07/2021 DOB: November 26, 1950  Age: 70 y.o. MRN#: [id number] Attending Physician: Elmarie Shiley, MD Primary Care Physician: Caren Macadam, MD Admit Date: 07/16/2021  Reason for Consultation/Follow-up: Establishing goals of care, "Discussed Most form, diseases trjectory, ...."  Subjective: Chart review performed. Received updates from primary RN - no acute concerns. RN reports patient had small bite of applesauce with meds but otherwise refusing PO intake.  11:00 AM Went to visit patient at bedside - [name], [name], and [name] were present. Patient was lying in bed - she is lethargic - intermittently wakes to voice gentle touch with family. No signs or non-verbal gestures of pain or discomfort noted. No respiratory distress, increased work of breathing, or secretions noted.   Dr. Tyrell Antonio was present to provide family with medical updates and reviewed hospital course thus far. I appreciate her assistance.  Met with family  to discuss diagnosis, prognosis, GOC, EOL wishes, disposition, and options.  I introduced Palliative Medicine as specialized medical care for people living with serious illness. It focuses on providing relief from the symptoms and stress of a serious illness. The goal is to improve quality of life for both the patient and the family.  We discussed a brief life review of the patient as well as functional and nutritional status. 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 She is not currently married. She had one son and one daughter - the daughter unfortunately passed away about 5 weeks ago.Prior to hospitalization, patient was living in a private residence with  [name] and his wife/Monique. Prior to 1.5 weeks ago, patient was able to ambulate independently as well as perform all ADLs. Family explain they have noticed a progressive decline physically and mentally for 3 weeks. Patient is not able to dress/bathe herself anymore and now not able to ambulate, feed herself, or hold a cup. Family also tell me patient has been incontinent and increasingly confused on dates/times. Prior to cardiac arrest, family report patient's appetite as "really good."  We discussed patient's current illness and what it means in the larger context of patient's on-going co-morbidities. Education provided that dementia is a progressive, non-curable disease underlying the patient's current acute medical conditions. Natural disease trajectory and expectations  at EOL were discussed. I attempted to elicit values and goals of care important to the patient. The difference between aggressive medical intervention and comfort care was considered in light of the patient's goals of care. We reviewed discharge options to include rehab with outpatient Palliative Care vs home/residential hospice.   We reviewed specific indicators of end stage dementia, including inability to communicate, bed bound/non-ambulatory status, decreased oral intake, swallowing dysfunction, and incontinence of bowel/bladder.  Discussed that the goal of rehab is improvement of functional status,  which can be a difficult goal to meet for patients with advanced illness and multiple medical conditions. Reviewed what is needed for someone to have a positive rehabilitation experience to include adequate nutritional intake as well as willingness/ability to participate. Family understand if patient discharges to rehab, she is at high risk for rehospitalization. Discussed with this option, possible need for PEG tube now or in the future. We discussed that evidence has shown that PEG tubes in patients with advanced dementia do not increase  survival, prevent aspiration, or improve wound healing.  They can promote isolation and use of restraints leading to increased potential for pressure ulcers. Use of PEG tubes is not medically recommended in this population; rather, careful hand feeding with aspiration precautions has been shown to provide the best quality of life. Family are clear that patient would not want to prolong her life by artificial nutrition/hydration with PEG.  Provided education and counseling at length on the philosophy and benefits of hospice care. Discussed that it offers a holistic approach to care in the setting of end-stage illness, and is about supporting the patient where they are allowing nature to take it's course. Discussed the hospice team includes RNs, physicians, social workers, and chaplains. They can provide personal care, support for the family, and help keep patient out of the hospital as well as assist with DME needs for home hospice. Education provided on the difference between home vs residential hospice. Family feel if they did chose hospice services, they would want the patient at home.   Prognostication was reviewed. Family are understandably tearful and emotional. Emotional support provided. Family would like time to process information given to them today. They are agreeable for PMT follow up tomorrow.  Discussed with patient/family the importance of continued conversation with each other and the medical providers regarding overall plan of care and treatment options, ensuring decisions are within the context of the patient's values and GOCs.    Questions and concerns were addressed. The patient/family was encouraged to call with questions and/or concerns. PMT card was provided.   Length of Stay: 10  Current Medications: Scheduled Meds:   amLODipine  5 mg Oral Daily   Chlorhexidine Gluconate Cloth  6 each Topical Q0600   donepezil  5 mg Oral QHS   feeding supplement  237 mL Oral TID BM    heparin  5,000 Units Subcutaneous Q8H   insulin aspart  0-15 Units Subcutaneous TID WC & HS   levothyroxine  125 mcg Oral Daily   memantine  5 mg Oral BID   metoprolol succinate  12.5 mg Oral Daily   multivitamin with minerals  1 tablet Oral Daily   polyethylene glycol  17 g Oral Daily   potassium chloride  40 mEq Oral Daily   risperiDONE  0.25 mg Oral QHS    Continuous Infusions:  sodium chloride 100 mL/hr at 07/06/21 0050   thiamine injection 250 mg (07/06/21 1722)    PRN Meds: hydrALAZINE  Physical  Exam Vitals and nursing note reviewed.  Constitutional:      General: She is not in acute distress.    Appearance: She is ill-appearing.  Pulmonary:     Effort: No respiratory distress.  Skin:    General: Skin is warm and dry.  Neurological:     Mental Status: She is lethargic and confused.     Motor: Weakness present.  Psychiatric:        Speech: Speech is slurred.        Behavior: Behavior is slowed. Behavior is cooperative.        Cognition and Memory: Memory normal. Cognition is impaired.            Vital Signs: BP (!) 148/63 (BP Location: Right Wrist)   Pulse 71   Temp 98.2 F (36.8 C) (Oral)   Resp 18   Ht $R'5\' 2"'jY$  (1.575 m)   Wt 56.3 kg   SpO2 100%   BMI 22.70 kg/m  SpO2: SpO2: 100 % O2 Device: O2 Device: Nasal Cannula O2 Flow Rate: O2 Flow Rate (L/min): 2 L/min  Intake/output summary:  Intake/Output Summary (Last 24 hours) at 07/07/2021 1029 Last data filed at 07/07/2021 0600 Gross per 24 hour  Intake 2786.01 ml  Output 2950 ml  Net -163.99 ml   LBM: Last BM Date: 07/06/21 Baseline Weight: Weight: 67.6 kg Most recent weight: Weight: 56.3 kg       Palliative Assessment/Data: PPS 10-20%    Flowsheet Rows    Flowsheet Row Most Recent Value  Intake Tab   Referral Department Hospitalist  Unit at Time of Referral Med/Surg Unit  Palliative Care Primary Diagnosis Neurology  Date Notified 07/06/21  Palliative Care Type New Palliative care  Reason  for referral Clarify Goals of Care  Date of Admission 07/04/2021  Date first seen by Palliative Care 07/06/21  # of days Palliative referral response time 0 Day(s)  # of days IP prior to Palliative referral 9  Clinical Assessment   Psychosocial & Spiritual Assessment   Palliative Care Outcomes   Patient/Family meeting held? No  [scheduled in person meeting for tomorrow]       Patient Active Problem List   Diagnosis Date Noted   Acute hypoxemic respiratory failure ([place])    Malnutrition of moderate degree 06/28/2021   Cardiac arrest ([place]) 06/23/2021   Pressure injury of skin 07/03/2021   Agitation 01/09/2021   Hypothyroid 12/15/2020   B12 deficiency 12/15/2020   Hyperlipidemia 10/25/2020   Overactive bladder 10/25/2020   Dementia with behavioral disturbance 10/25/2020    Palliative Care Assessment & Plan   Patient Profile: 70 y.o. female  with past medical history of dementia presented to ED on 06/21/2021 from home after witnessed cardiac arrest. CPR was initiated by family and patient was found in PEA on EMS arrival. 15 minutes of CPR was performed before ROSC; however, on arrival to ED there was minimal neurological functioning and she was intubated. Patient was admitted on 06/30/2021 with PEA cardiac arrest with unclear etiology, respiratory insufficiency in setting of cardiac arrest, encephalopathy, AKI. Patient was extubated 06/30/21.  Assessment: Cardiac arrest Pressure injury of skin Malnutrition of moderate degree Acute hypoxic respiratory failure Dementia  Recommendations/Plan: Continue current medical interventions Continue DNR/DNI as previously documented Family considering discharge to rehab with outpatient Palliative Care vs home with hospice - they request time to process information given to them today before making final decision Family are clear they do not wish to pursue feeding tube PMT will continue  to follow and support holistically  Goals of Care and  Additional Recommendations: Limitations on Scope of Treatment: Full Scope Treatment  Code Status:    Code Status Orders  (From admission, onward)           Start     Ordered   06/30/21 1007  Do not attempt resuscitation (DNR)  Continuous       Question Answer Comment  In the event of cardiac or respiratory ARREST Do not call a "code blue"   In the event of cardiac or respiratory ARREST Do not perform Intubation, CPR, defibrillation or ACLS   In the event of cardiac or respiratory ARREST Use medication by any route, position, wound care, and other measures to relive pain and suffering. May use oxygen, suction and manual treatment of airway obstruction as needed for comfort.      06/30/21 1006           Code Status History     Date Active Date Inactive Code Status Order ID Comments User Context   06/22/2021 1158 06/30/2021 1006 Full Code [id number]  Omar Person, NP ED       Prognosis: Could be <2 weeks if continues with minimal PO intake   Discharge Planning: To Be Determined  Care plan was discussed with primary RN, Dr. Tyrell Antonio, [facility], patient's family  Thank you for allowing the Palliative Medicine Team to assist in the care of this patient.   Total Time 75 minutes Prolonged Time Billed  yes       Greater than 50%  of this time was spent counseling and coordinating care related to the above assessment and plan.  Lin Landsman, NP  Please contact Palliative Medicine Team phone at [phone] for questions and concerns.

## 2021-07-07 NOTE — Progress Notes (Signed)
PROGRESS NOTE    Donna Patrick  [id number] DOB: 11/28/1950 DOA: 07/04/2021 PCP: Caren Macadam, MD   Brief Narrative: 59.  With past medical history significant for moderate dementia, recent loss of her daughter 3 weeks prior to admission,  patient became unresponsive 06/20/2021, she sustained a cardiac arrest, wide PEA.  Patient received CPR for 15 minutes and 1 round of epi.  She ultimately had ROSC.  On arrival to the ED she had a spontaneous breathing but minimal neurological function and was intubated.  CT head was negative for acute process.  Hospital events;  11/09; admitted for cardiac arrest, intubated in the ED 11/09; EEG concerning severe diffuse encephalopathy 11/11; continuous EEG suggestive of moderate to severe diffuse encephalopathy, MRI without acute abnormality.  Goals of care discussion with plan for one-way extubation and transition to DNR on extubation. 11/12; extubated    Assessment & Plan:   Active Problems:   Cardiac arrest ([place])   Pressure injury of skin   Malnutrition of moderate degree   Acute hypoxemic respiratory failure ([place])   1-PEA, Cardia Arrest;  -Unclear etiology.  -ECHO: Ejection fraction 55 to [id number], diastolic dysfunction grade 1. -Mild elevation of troponin -No significant electrolytes abnormality on admission -Cardiology has been consulted, for further recommendations.   Hypokalemia: Resolved.   Obstructive uropathy, probably: Foley catheter placed 11/14.  She will need voiding trial in 2 weeks. -hold bethanechol.  -she will need voiding trial.   Acute metabolic encephalopathy, Underline Mild dementia Anoxic brain injury.  -MRI 11/11: Negative for acute infarct, there is moderate atrophy.  Periventricular white matter hyperintensity may represent chronic ischemia. -EEG; study suggestive of moderate to severe diffuse encephalopathy, nonspecific etiology but could be secondary to sedation, anoxic hypoxic brain injury.  No seizure  or definite epileptiform discharge were seen throughout the recording.  -repeated CT head no acute changes.  ammonia level normal. , repeat TSH 29, increase synthroid. , B12 level elevated. . Suspect  Delirium. ABG negative for hypoxemia or hypercapnia.  Continue with delirium and jerking movement. I have consulted neurology for further recommendation.  No further testing needed per neuro. We could try low dose risperidone to see if that will help reduce jerking.    HTN;  Sinus tachycardia; started on Toprol.  Continue with Norvasc.  BP better controlled.   Possible aspiration pneumonia: Received 5 days of cefepime and vancomycin. Patient will required Oxygen at HS>   Hypothyroidism: TSH on admission at 25.  Continue with Synthroid. Repeated TSH 29, Free T 3 low 1.9.  Increase synthroid to 125 to help with  hypothyroidism.   Stage I coccygeal pressure ulcer: Follow Wound care recommendation.  Transaminases: Abdominal ultrasound 11/9: Layering gallbladder sludge, without associated sonographic findings to suggest acute cholecystitis.  Otherwise negative abdominal ultrasound. -resolved.   Moderate Malnutrition  On supplement.  No tube feeding or peg tube, patient wouldn't want artificial nutrition.        Nutrition Problem: Moderate Malnutrition Etiology: chronic illness (Alzheimer's dementia)    Signs/Symptoms: mild fat depletion, mild muscle depletion    Interventions: Ensure Enlive (each supplement provides 350kcal and 20 grams of protein)  Estimated body mass index is 22.7 kg/m as calculated from the following:   Height as of this encounter: 5\' 2"  (1.575 m).   Weight as of this encounter: 56.3 kg.   DVT prophylaxis: Heparin Code Status: DNR Family Communication: Son and daughter in [employer]. who were at bedside Disposition Plan:  Status is: Inpatient  Remains inpatient  appropriate because: Patient with probably delirium currently, plan for rehab when bed  available, also cardiology consultation        Consultants:  CCM admitted patient EGD 11/16  Procedures:  Echo: Normal ejection fraction  Antimicrobials:    Subjective: She is sleepy today. Would open eyes at time, would mumble word.   Objective: Vitals:   07/06/21 2108 07/07/21 0454 07/07/21 0500 07/07/21 0743  BP: 112/62 116/68  (!) 148/63  Pulse: 77 78  71  Resp: 20 18  18   Temp: 98.6 F (37 C) 98.2 F (36.8 C)    TempSrc: Axillary Oral    SpO2: 99% 96%  100%  Weight:   56.3 kg   Height:        Intake/Output Summary (Last 24 hours) at 07/07/2021 1247 Last data filed at 07/07/2021 0600 Gross per 24 hour  Intake 2786.01 ml  Output 2150 ml  Net 636.01 ml    Filed Weights   07/05/21 0412 07/06/21 0332 07/07/21 0500  Weight: 60.9 kg 59 kg 56.3 kg    Examination:  General exam: NAD Respiratory system: CTA Cardiovascular system: S 1, S 2 RRR Gastrointestinal system: BS present, soft, nt Central nervous system: sleepy.  Extremities: No edema   Data Reviewed: I have personally reviewed following labs and imaging studies  CBC: Recent Labs  Lab 07/01/21 0311 07/03/21 0206 07/05/21 1056 07/05/21 1217  WBC 7.7 8.8  --  10.4  HGB 11.4* 11.9* 12.2 12.5  HCT 33.1* 35.2* 36.0 37.1  MCV 86.2 88.0  --  89.0  PLT 147* 252  --  340    Basic Metabolic Panel: Recent Labs  Lab 07/01/21 0311 07/02/21 0232 07/03/21 0206 07/04/21 0207 07/05/21 0115 07/05/21 1056  NA 133* 135 135 133* 136 136  K 3.5 3.7 3.0* 3.3* 3.8 4.7  CL 95* 99 100 99 102  --   CO2 29 25 25 25 26   --   GLUCOSE 102* 103* 98 117* 115*  --   BUN 6* 10 11 12 12   --   CREATININE 0.57 0.65 0.68 0.67 0.66  --   CALCIUM 8.5* 9.0 8.8* 8.8* 8.9  --   MG 2.2  --   --  2.2 2.2  --   PHOS 2.8  --   --   --   --   --     GFR: Estimated Creatinine Clearance: 53.2 mL/min (by C-G formula based on SCr of 0.66 mg/dL). Liver Function Tests: Recent Labs  Lab 07/03/21 0206 07/04/21 0207   AST 30 22  ALT 35 29  ALKPHOS 115 113  BILITOT 0.7 0.4  PROT 5.6* 5.8*  ALBUMIN 3.0* 3.1*    No results for input(s): LIPASE, AMYLASE in the last 168 hours. Recent Labs  Lab 07/04/21 1012  AMMONIA 18    Coagulation Profile: No results for input(s): INR, PROTIME in the last 168 hours. Cardiac Enzymes: No results for input(s): CKTOTAL, CKMB, CKMBINDEX, TROPONINI in the last 168 hours. BNP (last 3 results) No results for input(s): PROBNP in the last 8760 hours. HbA1C: No results for input(s): HGBA1C in the last 72 hours. CBG: Recent Labs  Lab 07/06/21 1146 07/06/21 1638 07/06/21 2027 07/07/21 0740 07/07/21 1220  GLUCAP 127* 119* 150* 105* 103*    Lipid Profile: No results for input(s): CHOL, HDL, LDLCALC, TRIG, CHOLHDL, LDLDIRECT in the last 72 hours. Thyroid Function Tests: No results for input(s): TSH, T4TOTAL, FREET4, T3FREE, THYROIDAB in the last 72 hours.  Anemia Panel:  No results for input(s): VITAMINB12, FOLATE, FERRITIN, TIBC, IRON, RETICCTPCT in the last 72 hours.  Sepsis Labs: No results for input(s): PROCALCITON, LATICACIDVEN in the last 168 hours.   Recent Results (from the past 240 hour(s))  Respiratory (~20 pathogens) panel by PCR     Status: None   Collection Time: 07/04/2021  4:10 PM   Specimen: Nasopharyngeal Swab; Respiratory  Result Value Ref Range Status   Adenovirus NOT DETECTED NOT DETECTED Final   Coronavirus 229E NOT DETECTED NOT DETECTED Final    Comment: (NOTE) The Coronavirus on the Respiratory Panel, DOES NOT test for the novel  Coronavirus (2019 nCoV)    Coronavirus HKU1 NOT DETECTED NOT DETECTED Final   Coronavirus NL63 NOT DETECTED NOT DETECTED Final   Coronavirus OC43 NOT DETECTED NOT DETECTED Final   Metapneumovirus NOT DETECTED NOT DETECTED Final   Rhinovirus / Enterovirus NOT DETECTED NOT DETECTED Final   Influenza A NOT DETECTED NOT DETECTED Final   Influenza B NOT DETECTED NOT DETECTED Final   Parainfluenza Virus 1 NOT  DETECTED NOT DETECTED Final   Parainfluenza Virus 2 NOT DETECTED NOT DETECTED Final   Parainfluenza Virus 3 NOT DETECTED NOT DETECTED Final   Parainfluenza Virus 4 NOT DETECTED NOT DETECTED Final   Respiratory Syncytial Virus NOT DETECTED NOT DETECTED Final   Bordetella pertussis NOT DETECTED NOT DETECTED Final   Bordetella Parapertussis NOT DETECTED NOT DETECTED Final   Chlamydophila pneumoniae NOT DETECTED NOT DETECTED Final   Mycoplasma pneumoniae NOT DETECTED NOT DETECTED Final    Comment: Performed at [facility], [place]. [address] [place], [place] [address]  MRSA Next Gen by PCR, Nasal     Status: Abnormal   Collection Time: 06/28/21 11:41 AM   Specimen: Nasal Mucosa; Nasal Swab  Result Value Ref Range Status   MRSA by PCR Next Gen DETECTED (A) NOT DETECTED Final    Comment: RESULT CALLED TO, READ BACK BY AND VERIFIED WITH: Annetta Maw RN, AT 06/28/21 D. Victoriano Lain (NOTE) The GeneXpert MRSA Assay (FDA approved for NASAL specimens only), is one component of a comprehensive MRSA colonization surveillance program. It is not intended to diagnose MRSA infection nor to guide or monitor treatment for MRSA infections. Test performance is not FDA approved in patients less than 33 years old. Performed at [facility], [place] [address] [place], [place] [address]   SARS CORONAVIRUS 2 (TAT 6-24 HRS) Nasopharyngeal Nasopharyngeal Swab     Status: None   Collection Time: 07/03/21  4:43 PM   Specimen: Nasopharyngeal Swab  Result Value Ref Range Status   SARS Coronavirus 2 NEGATIVE NEGATIVE Final    Comment: (NOTE) SARS-CoV-2 target nucleic acids are NOT DETECTED.  The SARS-CoV-2 RNA is generally detectable in upper and lower respiratory specimens during the acute phase of infection. Negative results do not preclude SARS-CoV-2 infection, do not rule out co-infections with other pathogens, and should not be used as the sole basis for treatment or other patient management  decisions. Negative results must be combined with clinical observations, patient history, and epidemiological information. The expected result is Negative.  Fact Sheet for Patients: SugarRoll.be  Fact Sheet for Healthcare Providers: https://www.woods-mathews.com/  This test is not yet approved or cleared by the [place] FDA and  has been authorized for detection and/or diagnosis of SARS-CoV-2 by FDA under an Emergency Use Authorization (EUA). This EUA will remain  in effect (meaning this test can be used) for the duration of the COVID-19 declaration under Se ction  564(b)(1) of the Act, 21 U.S.C. section 360bbb-3(b)(1), unless the authorization is terminated or revoked sooner.  Performed at [facility], [place] [address] [place], [place] [address]           Radiology Studies: No results found.      Scheduled Meds:  amLODipine  5 mg Oral Daily   Chlorhexidine Gluconate Cloth  6 each Topical Q0600   donepezil  5 mg Oral QHS   feeding supplement  237 mL Oral TID BM   heparin  5,000 Units Subcutaneous Q8H   insulin aspart  0-15 Units Subcutaneous TID WC & HS   levothyroxine  125 mcg Oral Daily   memantine  5 mg Oral BID   metoprolol succinate  12.5 mg Oral Daily   multivitamin with minerals  1 tablet Oral Daily   polyethylene glycol  17 g Oral Daily   potassium chloride  40 mEq Oral Daily   risperiDONE  0.25 mg Oral QHS   Continuous Infusions:  sodium chloride 100 mL/hr at 07/06/21 0050   thiamine injection 250 mg (07/06/21 1722)      LOS: 10 days    Time spent: 35 minutes.     Elmarie Shiley, MD [facility]   If 7PM-7AM, please contact night-coverage [email or web address]  07/07/2021, 12:47 PM

## 2021-07-08 DIAGNOSIS — I469 Cardiac arrest, cause unspecified: Secondary | ICD-10-CM | POA: Diagnosis not present

## 2021-07-08 DIAGNOSIS — N179 Acute kidney failure, unspecified: Secondary | ICD-10-CM

## 2021-07-08 DIAGNOSIS — R451 Restlessness and agitation: Secondary | ICD-10-CM | POA: Diagnosis not present

## 2021-07-08 DIAGNOSIS — E440 Moderate protein-calorie malnutrition: Secondary | ICD-10-CM | POA: Diagnosis not present

## 2021-07-08 DIAGNOSIS — R638 Other symptoms and signs concerning food and fluid intake: Secondary | ICD-10-CM

## 2021-07-08 DIAGNOSIS — J9601 Acute respiratory failure with hypoxia: Secondary | ICD-10-CM | POA: Diagnosis not present

## 2021-07-08 LAB — BASIC METABOLIC PANEL
Anion gap: 7 (ref 5–15)
BUN: 7 mg/dL — ABNORMAL LOW (ref 8–23)
CO2: 29 mmol/L (ref 22–32)
Calcium: 8.9 mg/dL (ref 8.9–10.3)
Chloride: 99 mmol/L (ref 98–111)
Creatinine, Ser: 0.75 mg/dL (ref 0.44–1.00)
GFR, Estimated: >60 mL/min (ref >60)
Glucose, Bld: 104 mg/dL — ABNORMAL HIGH (ref 70–99)
Potassium: 3.5 mmol/L (ref 3.5–5.1)
Sodium: 135 mmol/L (ref 135–145)

## 2021-07-08 LAB — GLUCOSE, CAPILLARY
Glucose-Capillary: 99 mg/dL (ref 70–99)
Glucose-Capillary: 111 mg/dL — ABNORMAL HIGH (ref 70–99)
Glucose-Capillary: 128 mg/dL — ABNORMAL HIGH (ref 70–99)

## 2021-07-08 MED ORDER — POTASSIUM CHLORIDE 10 MEQ/100ML IV SOLN: 10.0000 meq | INTRAVENOUS | Status: DC

## 2021-07-08 NOTE — Progress Notes (Signed)
Daily Progress Note   Patient Name: Donna Patrick       Date: 07/08/2021 DOB: 07-30-1951  Age: 70 y.o. MRN#: [id number] Attending Physician: Alba Cory, MD Primary Care Physician: Wynn Banker, MD Admit Date: 07/02/2021  Reason for Consultation/Follow-up: Establishing goals of care, "Discussed Most form, diseases trjectory, ...."  Subjective: Chart review performed. Received report from primary RN - no acute concerns. Patient remains with poor PO intake.  Went to patient's bedside - son and DIL present. Patient was lying in bed - she is lethargic - intermittently wakes to voice gentle touch with family. No signs or non-verbal gestures of pain or discomfort noted. No respiratory distress, increased work of breathing, or secretions noted.  Emotional support provided. Family recognize patient will likely not find any benefit from rehab. They are considering hospice but state they have "several other family" to speak with. Answered questions about home hospice in context of discharge process as well as DME. Reviewed expectations of family members with home hospice. Family have not made and "official decision" at this time, but seem to be considering home vs residential hospice on discharge. They would like to speak with hospice liaison to ask specific questions of their organization before making decision - they request [facility]. Therapeutic listening provided as they reflect on how close their family is and how the patient has been an important part of their lives.   All questions and concerns addressed. Encouraged to call with questions and/or concerns. PMT card previously provided. Family agreeable for PMT follow up tomorrow.   Length of Stay: 11  Current  Medications: Scheduled Meds:   amLODipine  5 mg Oral Daily   Chlorhexidine Gluconate Cloth  6 each Topical Q0600   donepezil  5 mg Oral QHS   feeding supplement  237 mL Oral TID BM   heparin  5,000 Units Subcutaneous Q8H   insulin aspart  0-15 Units Subcutaneous TID WC & HS   levothyroxine  125 mcg Oral Daily   memantine  5 mg Oral BID   metoprolol succinate  12.5 mg Oral Daily   multivitamin with minerals  1 tablet Oral Daily   polyethylene glycol  17 g Oral Daily   potassium chloride  40 mEq Oral Daily   risperiDONE  0.25 mg Oral QHS  Continuous Infusions:  sodium chloride 100 mL/hr at 07/08/21 0954   thiamine injection 250 mg (07/07/21 1653)    PRN Meds: hydrALAZINE  Physical Exam Vitals and nursing note reviewed.  Constitutional:      General: She is not in acute distress.    Appearance: She is ill-appearing.  Pulmonary:     Effort: No respiratory distress.  Skin:    General: Skin is warm and dry.  Neurological:     Mental Status: She is alert and oriented to person, place, and time. She is confused.     Motor: Weakness present.  Psychiatric:        Attention and Perception: Attention normal.        Speech: Speech is slurred.        Behavior: Behavior is slowed. Behavior is cooperative.        Cognition and Memory: Cognition and memory normal.            Vital Signs: BP (!) 145/95 (BP Location: Left Arm)   Pulse 72   Temp 97.7 F (36.5 C) (Oral)   Resp 18   Ht 5\' 2"  (1.575 m)   Wt 55.5 kg   SpO2 100%   BMI 22.38 kg/m  SpO2: SpO2: 100 % O2 Device: O2 Device: Nasal Cannula O2 Flow Rate: O2 Flow Rate (L/min): 2 L/min  Intake/output summary:  Intake/Output Summary (Last 24 hours) at 07/08/2021 1025 Last data filed at 07/08/2021 0545 Gross per 24 hour  Intake 1200 ml  Output 1675 ml  Net -475 ml   LBM: Last BM Date: 07/06/21 Baseline Weight: Weight: 67.6 kg Most recent weight: Weight: 55.5 kg       Palliative Assessment/Data: PPS 10-20% - takes  bites with meds    Flowsheet Rows    Flowsheet Row Most Recent Value  Intake Tab   Referral Department Hospitalist  Unit at Time of Referral Med/Surg Unit  Palliative Care Primary Diagnosis Neurology  Date Notified 07/06/21  Palliative Care Type New Palliative care  Reason for referral Clarify Goals of Care  Date of Admission 06/26/2021  Date first seen by Palliative Care 07/06/21  # of days Palliative referral response time 0 Day(s)  # of days IP prior to Palliative referral 9  Clinical Assessment   Psychosocial & Spiritual Assessment   Palliative Care Outcomes   Patient/Family meeting held? No  [scheduled in person meeting for tomorrow]       Patient Active Problem List   Diagnosis Date Noted   Acute hypoxemic respiratory failure (HCC)    Malnutrition of moderate degree 06/28/2021   Cardiac arrest ([facility]CC) 06/19/2021   Pressure injury of skin 07/03/2021   Agitation 01/09/2021   Hypothyroid 12/15/2020   B12 deficiency 12/15/2020   Hyperlipidemia 10/25/2020   Overactive bladder 10/25/2020   Dementia with behavioral disturbance 10/25/2020    Palliative Care Assessment & Plan   Patient Profile: 70 y.o. female  with past medical history of dementia presented to ED on 07/07/2021 from home after witnessed cardiac arrest. CPR was initiated by family and patient was found in PEA on EMS arrival. 15 minutes of CPR was performed before ROSC; however, on arrival to ED there was minimal neurological functioning and she was intubated. Patient was admitted on 06/19/2021 with PEA cardiac arrest with unclear etiology, respiratory insufficiency in setting of cardiac arrest, encephalopathy, AKI. Patient was extubated 06/30/21.  Assessment: Cardiac arrest Pressure injury of skin Malnutrition of moderate degree Acute hypoxic respiratory failure Dementia  Recommendations/Plan: Continue  current medical treatment Continue DNR/DNI as previously documented Family have not made an official  decision for hospice, but seem to be considering home vs residential hospice on discharge as they recognize patient will likely not gain any benefit from rehab at this point Family have specific questions for [facility] liaison notified and will reach out to assist PMT will continue to follow and support holistically  Goals of Care and Additional Recommendations: Limitations on Scope of Treatment: No Artificial Feeding and No Tracheostomy  Code Status:    Code Status Orders  (From admission, onward)           Start     Ordered   06/30/21 1007  Do not attempt resuscitation (DNR)  Continuous       Question Answer Comment  In the event of cardiac or respiratory ARREST Do not call a "code blue"   In the event of cardiac or respiratory ARREST Do not perform Intubation, CPR, defibrillation or ACLS   In the event of cardiac or respiratory ARREST Use medication by any route, position, wound care, and other measures to relive pain and suffering. May use oxygen, suction and manual treatment of airway obstruction as needed for comfort.      06/30/21 1006           Code Status History     Date Active Date Inactive Code Status Order ID Comments User Context   July 18, 2021 1158 06/30/2021 1006 Full Code [id number]  Tobey Grim, NP ED       Prognosis:  < 2 weeks  Discharge Planning: To Be Determined  Care plan was discussed with primary RN, patient's family, Dr. Sunnie Nielsen, [facility], [facility] liaison  Thank you for allowing the Palliative Medicine Team to assist in the care of this patient.   Total Time 37 minutes Prolonged Time Billed  no       Greater than 50%  of this time was spent counseling and coordinating care related to the above assessment and plan.  Haskel Khan, NP  Please contact Palliative Medicine Team phone at [phone] for questions and concerns.

## 2021-07-08 NOTE — Progress Notes (Signed)
[facility] 6N06   [facility]) Hospital Liaison Note  Request received to speak with family and provide information regarding hospice at home and [facility]. Son informed liaison that he would like a follow up call tomorrow. [facility] liaison will follow up tomorrow. Please call with any questions or concerns.   Thank you for the opportunity to participate in this patient's care.   Ardis Rowan [facility] Liaison [phone]

## 2021-07-08 NOTE — Progress Notes (Signed)
PROGRESS NOTE    Donna Patrick  QAS:[id number] DOB: [birth year] DOA: 07/19/21 PCP: Wynn Banker, MD   Brief Narrative: 29.  With past medical history significant for moderate dementia, recent loss of her daughter 3 weeks prior to admission,  patient became unresponsive 07/19/21, she sustained a cardiac arrest, wide PEA.  Patient received CPR for 15 minutes and 1 round of epi.  She ultimately had ROSC.  On arrival to the ED she had a spontaneous breathing but minimal neurological function and was intubated.  CT head was negative for acute process.  Hospital events:  11/09; admitted for cardiac arrest, intubated in the ED 11/09; EEG concerning severe diffuse encephalopathy 11/11; continuous EEG suggestive of moderate to severe diffuse encephalopathy, MRI without acute abnormality.  Goals of care discussion with plan for one-way extubation and transition to DNR on extubation. 11/12; extubated.    Assessment & Plan:   Active Problems:   Cardiac arrest (HCC)   Pressure injury of skin   Malnutrition of moderate degree   Acute hypoxemic respiratory failure (HCC)   1-PEA, Cardia Arrest;  -Unclear etiology.  -ECHO: Ejection fraction 55 to 60%, diastolic dysfunction grade 1. -Mild elevation of troponin -No significant electrolytes abnormality on admission -Cardiology has been consulted, for further recommendations.   Hypokalemia: Resolved.   Obstructive uropathy, probably: Foley catheter placed 11/14.  She will need voiding trial in 2 weeks. -hold bethanechol.  -she will need voiding trial out patient.   Acute metabolic encephalopathy, Underline Mild dementia Anoxic brain injury.  -MRI 11/11: Negative for acute infarct, there is moderate atrophy.  Periventricular white matter hyperintensity may represent chronic ischemia. -EEG; study suggestive of moderate to severe diffuse encephalopathy, nonspecific etiology but could be secondary to sedation, anoxic hypoxic brain  injury.  No seizure or definite epileptiform discharge were seen throughout the recording.  -repeated CT head no acute changes.  ammonia level normal. , repeat TSH 29, increase synthroid. , B12 level elevated. . -Suspect  Delirium. -ABG negative for hypoxemia or hypercapnia.  -Continue with delirium and jerking movement. I have consulted neurology for further recommendation.  No further testing needed per neuro. We could try low dose risperidone to see if that will help reduce jerking.    HTN;  Sinus tachycardia; started on Toprol.  Continue with Norvasc.  BP better controlled.   Possible aspiration pneumonia: Received 5 days of cefepime and vancomycin. Patient will required Oxygen at HS>   Hypothyroidism: TSH on admission at 25.  Continue with Synthroid. Repeated TSH 29, Free T 3 low 1.9.  Increase synthroid to 125 to help with  hypothyroidism.   Stage I coccygeal pressure ulcer: Follow Wound care recommendation.  Transaminases: Abdominal ultrasound 11/9: Layering gallbladder sludge, without associated sonographic findings to suggest acute cholecystitis.  Otherwise negative abdominal ultrasound. -Resolved.   Moderate Malnutrition  On supplement.  No tube feeding or peg tube, patient wouldn't want artificial nutrition.        Nutrition Problem: Moderate Malnutrition Etiology: chronic illness (Alzheimer's dementia)    Signs/Symptoms: mild fat depletion, mild muscle depletion    Interventions: Ensure Enlive (each supplement provides 350kcal and 20 grams of protein)  Estimated body mass index is 22.38 kg/m as calculated from the following:   Height as of this encounter: 5\' 2"  (1.575 m).   Weight as of this encounter: 55.5 kg.   DVT prophylaxis: Heparin Code Status: DNR Family Communication: Son and daughter in law 11/19 Disposition Plan:  Status is: Inpatient  Remains inpatient appropriate  because: Patient with probably delirium currently, plan for rehab when bed  available, also cardiology consultation        Consultants:  CCM admitted patient EGD 11/16  Procedures:  Echo: Normal ejection fraction  Antimicrobials:    Subjective: She was alert on my evaluation.  She mumble few words.    Objective: Vitals:   07/08/21 0452 07/08/21 0500 07/08/21 0543 07/08/21 0812  BP: (!) 142/60  120/60 (!) 145/95  Pulse: 82  70 72  Resp: 18  18 18   Temp: 99 F (37.2 C)  98.6 F (37 C) 97.7 F (36.5 C)  TempSrc: Oral  Axillary Oral  SpO2: 100%  100% 100%  Weight:  55.5 kg    Height:        Intake/Output Summary (Last 24 hours) at 07/08/2021 1331 Last data filed at 07/08/2021 0545 Gross per 24 hour  Intake 1200 ml  Output 1675 ml  Net -475 ml    Filed Weights   07/06/21 0332 07/07/21 0500 07/08/21 0500  Weight: 59 kg 56.3 kg 55.5 kg    Examination:  General exam: NAD Respiratory system: CTA Cardiovascular system: S 1. S 2 RRR Gastrointestinal system: BS present, soft, nt Central nervous system: Alert.  Extremities: No edema  Data Reviewed: I have personally reviewed following labs and imaging studies  CBC: Recent Labs  Lab 07/03/21 0206 07/05/21 1056 07/05/21 1217  WBC 8.8  --  10.4  HGB 11.9* 12.2 12.5  HCT 35.2* 36.0 37.1  MCV 88.0  --  89.0  PLT 252  --  [id number]    Basic Metabolic Panel: Recent Labs  Lab 07/02/21 0232 07/03/21 0206 07/04/21 0207 07/05/21 0115 07/05/21 1056 07/08/21 0115  NA 135 135 133* 136 136 135  K 3.7 3.0* 3.3* 3.8 4.7 3.5  CL 99 100 99 102  --  99  CO2 25 25 25 26   --  29  GLUCOSE 103* 98 117* 115*  --  104*  BUN 10 11 12 12   --  7*  CREATININE 0.65 0.68 0.67 0.66  --  0.75  CALCIUM 9.0 8.8* 8.8* 8.9  --  8.9  MG  --   --  2.2 2.2  --   --     GFR: Estimated Creatinine Clearance: 53.2 mL/min (by C-G formula based on SCr of 0.75 mg/dL). Liver Function Tests: Recent Labs  Lab 07/03/21 0206 07/04/21 0207  AST 30 22  ALT 35 29  ALKPHOS 115 113  BILITOT 0.7 0.4  PROT 5.6*  5.8*  ALBUMIN 3.0* 3.1*    No results for input(s): LIPASE, AMYLASE in the last 168 hours. Recent Labs  Lab 07/04/21 1012  AMMONIA 18    Coagulation Profile: No results for input(s): INR, PROTIME in the last 168 hours. Cardiac Enzymes: No results for input(s): CKTOTAL, CKMB, CKMBINDEX, TROPONINI in the last 168 hours. BNP (last 3 results) No results for input(s): PROBNP in the last 8760 hours. HbA1C: No results for input(s): HGBA1C in the last 72 hours. CBG: Recent Labs  Lab 07/07/21 1220 07/07/21 1649 07/07/21 2118 07/08/21 0741 07/08/21 1138  GLUCAP 103* 102* 105* 99 111*    Lipid Profile: No results for input(s): CHOL, HDL, LDLCALC, TRIG, CHOLHDL, LDLDIRECT in the last 72 hours. Thyroid Function Tests: No results for input(s): TSH, T4TOTAL, FREET4, T3FREE, THYROIDAB in the last 72 hours.  Anemia Panel: No results for input(s): VITAMINB12, FOLATE, FERRITIN, TIBC, IRON, RETICCTPCT in the last 72 hours.  Sepsis Labs: No results for  input(s): PROCALCITON, LATICACIDVEN in the last 168 hours.   Recent Results (from the past 240 hour(s))  SARS CORONAVIRUS 2 (TAT 6-24 HRS) Nasopharyngeal Nasopharyngeal Swab     Status: None   Collection Time: 07/03/21  4:43 PM   Specimen: Nasopharyngeal Swab  Result Value Ref Range Status   SARS Coronavirus 2 NEGATIVE NEGATIVE Final    Comment: (NOTE) SARS-CoV-2 target nucleic acids are NOT DETECTED.  The SARS-CoV-2 RNA is generally detectable in upper and lower respiratory specimens during the acute phase of infection. Negative results do not preclude SARS-CoV-2 infection, do not rule out co-infections with other pathogens, and should not be used as the sole basis for treatment or other patient management decisions. Negative results must be combined with clinical observations, patient history, and epidemiological information. The expected result is Negative.  Fact Sheet for  Patients: SugarRoll.be  Fact Sheet for Healthcare Providers: https://www.woods-mathews.com/  This test is not yet approved or cleared by the [place] FDA and  has been authorized for detection and/or diagnosis of SARS-CoV-2 by FDA under an Emergency Use Authorization (EUA). This EUA will remain  in effect (meaning this test can be used) for the duration of the COVID-19 declaration under Se ction 564(b)(1) of the Act, 21 U.S.C. section 360bbb-3(b)(1), unless the authorization is terminated or revoked sooner.  Performed at [facility], [place] [address] [place], [place] [address]           Radiology Studies: No results found.      Scheduled Meds:  amLODipine  5 mg Oral Daily   Chlorhexidine Gluconate Cloth  6 each Topical Q0600   donepezil  5 mg Oral QHS   feeding supplement  237 mL Oral TID BM   heparin  5,000 Units Subcutaneous Q8H   insulin aspart  0-15 Units Subcutaneous TID WC & HS   levothyroxine  125 mcg Oral Daily   memantine  5 mg Oral BID   metoprolol succinate  12.5 mg Oral Daily   multivitamin with minerals  1 tablet Oral Daily   polyethylene glycol  17 g Oral Daily   potassium chloride  40 mEq Oral Daily   risperiDONE  0.25 mg Oral QHS   Continuous Infusions:  sodium chloride 100 mL/hr at 07/08/21 0954   thiamine injection 250 mg (07/07/21 1653)      LOS: 11 days    Time spent: 35 minutes.     Elmarie Shiley, MD [facility]   If 7PM-7AM, please contact night-coverage [email or web address]  07/08/2021, 1:31 PM

## 2021-07-09 ENCOUNTER — Telehealth: Payer: Medicare Other

## 2021-07-09 DIAGNOSIS — J9601 Acute respiratory failure with hypoxia: Secondary | ICD-10-CM | POA: Diagnosis not present

## 2021-07-09 DIAGNOSIS — N179 Acute kidney failure, unspecified: Secondary | ICD-10-CM | POA: Diagnosis not present

## 2021-07-09 DIAGNOSIS — R451 Restlessness and agitation: Secondary | ICD-10-CM | POA: Diagnosis not present

## 2021-07-09 DIAGNOSIS — E440 Moderate protein-calorie malnutrition: Secondary | ICD-10-CM | POA: Diagnosis not present

## 2021-07-09 LAB — GLUCOSE, CAPILLARY
Glucose-Capillary: 109 mg/dL — ABNORMAL HIGH (ref 70–99)
Glucose-Capillary: 104 mg/dL — ABNORMAL HIGH (ref 70–99)
Glucose-Capillary: 108 mg/dL — ABNORMAL HIGH (ref 70–99)

## 2021-07-09 MED ORDER — ACETAMINOPHEN 325 MG PO TABS: 650.0000 mg | ORAL_TABLET | Freq: Four times a day (QID) | ORAL | Status: DC | PRN

## 2021-07-09 MED ORDER — LORAZEPAM 2 MG/ML PO CONC: 1.0000 mg | ORAL | Status: DC | PRN

## 2021-07-09 MED ORDER — LORAZEPAM 1 MG PO TABS: 1.0000 mg | ORAL_TABLET | ORAL | Status: DC | PRN

## 2021-07-09 MED ORDER — GLYCOPYRROLATE 0.2 MG/ML IJ SOLN: 0.2000 mg | INTRAMUSCULAR | Status: DC | PRN

## 2021-07-09 MED ORDER — ENSURE ENLIVE PO LIQD: 237.0000 mL | ORAL | Status: DC | PRN

## 2021-07-09 MED ORDER — MORPHINE SULFATE 10 MG/5ML PO SOLN: 5.0000 mg | ORAL | Status: DC | PRN

## 2021-07-09 MED ORDER — ACETAMINOPHEN 650 MG RE SUPP: 650.0000 mg | Freq: Four times a day (QID) | RECTAL | Status: DC | PRN

## 2021-07-09 MED ORDER — LORAZEPAM 2 MG/ML IJ SOLN
0.5000 mg | Freq: Four times a day (QID) | INTRAMUSCULAR | Status: DC
  Administered 2021-07-09 – 2021-07-10 (×4): 0.5 mg via INTRAVENOUS
  Filled 2021-07-09 (×5): qty 1

## 2021-07-09 MED ORDER — BIOTENE DRY MOUTH MT LIQD
15.0000 mL | Freq: Two times a day (BID) | OROMUCOSAL | Status: DC
  Administered 2021-07-09 – 2021-07-11 (×5): 15 mL via TOPICAL

## 2021-07-09 MED ORDER — HALOPERIDOL LACTATE 2 MG/ML PO CONC
2.0000 mg | Freq: Four times a day (QID) | ORAL | Status: DC | PRN
  Filled 2021-07-09: qty 1

## 2021-07-09 MED ORDER — ONDANSETRON 4 MG PO TBDP: 4.0000 mg | ORAL_TABLET | Freq: Four times a day (QID) | ORAL | Status: DC | PRN

## 2021-07-09 MED ORDER — POLYVINYL ALCOHOL 1.4 % OP SOLN
1.0000 [drp] | Freq: Four times a day (QID) | OPHTHALMIC | Status: DC | PRN
  Filled 2021-07-09: qty 15

## 2021-07-09 MED ORDER — ONDANSETRON HCL 4 MG/2ML IJ SOLN: 4.0000 mg | Freq: Four times a day (QID) | INTRAMUSCULAR | Status: DC | PRN

## 2021-07-09 MED ORDER — HALOPERIDOL 1 MG PO TABS
2.0000 mg | ORAL_TABLET | Freq: Four times a day (QID) | ORAL | Status: DC | PRN
  Filled 2021-07-09: qty 2

## 2021-07-09 MED ORDER — GLYCOPYRROLATE 1 MG PO TABS
1.0000 mg | ORAL_TABLET | ORAL | Status: DC | PRN
  Filled 2021-07-09: qty 1

## 2021-07-09 MED ORDER — LORAZEPAM 2 MG/ML IJ SOLN
1.0000 mg | INTRAMUSCULAR | Status: DC | PRN
  Administered 2021-07-10 (×2): 1 mg via INTRAVENOUS
  Filled 2021-07-09: qty 1

## 2021-07-09 MED ORDER — HALOPERIDOL LACTATE 5 MG/ML IJ SOLN: 2.0000 mg | Freq: Four times a day (QID) | INTRAMUSCULAR | Status: DC | PRN

## 2021-07-09 NOTE — Progress Notes (Signed)
Civil engineer, contracting [facility]) Hospital Liaison note.     Received request from [facility] manager for family interest in Home hospice vs [facility]. Family has chosen [facility].    [facility] is unable to offer a room today. Hospital Liaison will follow up tomorrow or sooner if a room becomes available and eligibility is confirmed.    Please do not hesitate to call with questions.     Thank you,     Elsie Saas, RN, [facility]        [facility] Liaison     [phone]

## 2021-07-09 NOTE — Progress Notes (Signed)
Daily Progress Note   Patient Name: Donna Patrick       Date: 07/09/2021 DOB: [birth year]  Age: 70 y.o. MRN#: [id number] Attending Physician: Alba Cory, MD Primary Care Physician: Wynn Banker, MD Admit Date: 09-Jul-2021  Reason for Consultation/Follow-up: Establishing goals of care, "Discussed Most form, diseases trjectory, ...."  Subjective: Chart review performed. Received report from primary RN - no acute concerns. RN reports patient continues with minimal/no PO intake.  Went to visit patient at bedside - son and DIL present. Therapeutic listening provided as they review how they feel overwhelmed with current situation and how another family member is now admitted to [facility], also likely dying. Emotional support provided.   Patient was lying in bed - she intermittently wakes but promptly falls back asleep. No signs or non-verbal gestures of pain or discomfort noted. No respiratory distress, increased work of breathing, or secretions noted. She does seem restless.  Reviewed options of home vs residential hospice again. They are agreeable to speak with hospice liaison today in hopes to get some of their questions answered - the information they receive will aid in their decision making. Since it is anticipated patient will continue to decline, encouraged family to make decision sooner than later with hopes patient will still be stable for transport.  We talked about transition to comfort measures in house and what that would entail inclusive of medications to control pain, dyspnea, agitation, nausea, and itching. We discussed stopping all unnecessary measures such as blood draws, needle sticks, oxygen, antibiotics, CBGs/insulin, cardiac monitoring, IVF, and frequent vital signs. Family  are agreeable for transition to full comfort measures today with the goal to continue home meds to keep her stable for transfer home vs residential hospice.   All questions and concerns addressed. Encouraged to call with questions and/or concerns. PMT card provided.  Length of Stay: 12  Current Medications: Scheduled Meds:   amLODipine  5 mg Oral Daily   Chlorhexidine Gluconate Cloth  6 each Topical Q0600   donepezil  5 mg Oral QHS   feeding supplement  237 mL Oral TID BM   heparin  5,000 Units Subcutaneous Q8H   insulin aspart  0-15 Units Subcutaneous TID WC & HS   levothyroxine  125 mcg Oral Daily   memantine  5 mg Oral BID  metoprolol succinate  12.5 mg Oral Daily   multivitamin with minerals  1 tablet Oral Daily   polyethylene glycol  17 g Oral Daily   potassium chloride  40 mEq Oral Daily   risperiDONE  0.25 mg Oral QHS    Continuous Infusions:  sodium chloride 100 mL/hr at 07/09/21 0828    PRN Meds: hydrALAZINE  Physical Exam Vitals and nursing note reviewed.  Constitutional:      General: She is not in acute distress.    Appearance: She is ill-appearing.  Pulmonary:     Effort: No respiratory distress.  Skin:    General: Skin is warm and dry.  Neurological:     Mental Status: She is lethargic and confused.     Motor: Weakness present.  Psychiatric:        Speech: She is noncommunicative.            Vital Signs: BP (!) 141/73 (BP Location: Right Arm)   Pulse 76   Temp 97.6 F (36.4 C)   Resp 18   Ht 5\' 2"  (1.575 m)   Wt 54.9 kg   SpO2 97%   BMI 22.14 kg/m  SpO2: SpO2: 97 % O2 Device: O2 Device: Room Air O2 Flow Rate: O2 Flow Rate (L/min): 2 L/min  Intake/output summary:  Intake/Output Summary (Last 24 hours) at 07/09/2021 0908 Last data filed at 07/08/2021 2219 Gross per 24 hour  Intake 1540.45 ml  Output 1400 ml  Net 140.45 ml   LBM: Last BM Date: 07/08/21 Baseline Weight: Weight: 67.6 kg Most recent weight: Weight: 54.9 kg        Palliative Assessment/Data: PPS 10-20%, bites and sips with meds    Flowsheet Rows    Flowsheet Row Most Recent Value  Intake Tab   Referral Department Hospitalist  Unit at Time of Referral Med/Surg Unit  Palliative Care Primary Diagnosis Neurology  Date Notified 07/06/21  Palliative Care Type New Palliative care  Reason for referral Clarify Goals of Care  Date of Admission 2021-07-17  Date first seen by Palliative Care 07/06/21  # of days Palliative referral response time 0 Day(s)  # of days IP prior to Palliative referral 9  Clinical Assessment   Psychosocial & Spiritual Assessment   Palliative Care Outcomes   Patient/Family meeting held? No  [scheduled in person meeting for tomorrow]       Patient Active Problem List   Diagnosis Date Noted   AKI (acute kidney injury) (HCC)    Acute hypoxemic respiratory failure (HCC)    Malnutrition of moderate degree 06/28/2021   Cardiac arrest (HCC) 2021/07/17   Pressure injury of skin Jul 17, 2021   Agitation 01/09/2021   Hypothyroid 12/15/2020   B12 deficiency 12/15/2020   Hyperlipidemia 10/25/2020   Overactive bladder 10/25/2020   Dementia with behavioral disturbance 10/25/2020    Palliative Care Assessment & Plan   Patient Profile: 70 y.o. female  with past medical history of dementia presented to ED on 2021/07/17 from home after witnessed cardiac arrest. CPR was initiated by family and patient was found in PEA on EMS arrival. 15 minutes of CPR was performed before ROSC; however, on arrival to ED there was minimal neurological functioning and she was intubated. Patient was admitted on 07-17-21 with PEA cardiac arrest with unclear etiology, respiratory insufficiency in setting of cardiac arrest, encephalopathy, AKI. Patient was extubated 06/30/21.  Assessment: Cardiac arrest Pressure injury of skin Malnutrition of moderate degree Acute hypoxic respiratory failure Dementia Terminal care  Recommendations/Plan: Initiated full  comfort measures Continue DNR/DNI as previously documented Family deciding on residential hospice vs home hospice - hospice liaison to follow up today and answer their questions Added orders for EOL symptom management and to reflect full comfort measures, as well as discontinued orders that were not focused on comfort Continue home meds with goal to try and keep patient stable for transport home/hospice facility Scheduled 0.5mg  ativan q6 for restlessness Unrestricted visitation orders were placed per current [place] COVID19 EOL visitation policy  Nursing to provide frequent assessments and administer PRN medications as clinically necessary to ensure EOL comfort PMT will continue to follow and support holistically   Goals of Care and Additional Recommendations: Limitations on Scope of Treatment: Full Comfort Care  Code Status:    Code Status Orders  (From admission, onward)           Start     Ordered   06/30/21 1007  Do not attempt resuscitation (DNR)  Continuous       Question Answer Comment  In the event of cardiac or respiratory ARREST Do not call a "code blue"   In the event of cardiac or respiratory ARREST Do not perform Intubation, CPR, defibrillation or ACLS   In the event of cardiac or respiratory ARREST Use medication by any route, position, wound care, and other measures to relive pain and suffering. May use oxygen, suction and manual treatment of airway obstruction as needed for comfort.      06/30/21 1006           Code Status History     Date Active Date Inactive Code Status Order ID Comments User Context   2021-07-06 1158 06/30/2021 1006 Full Code [id number]  Tobey Grim, NP ED       Prognosis:  < 2 weeks  Discharge Planning: To Be Determined  Care plan was discussed with primary RN, patient's family, Dr. Sunnie Nielsen, [facility], [facility] liaison  Thank you for allowing the Palliative Medicine Team to assist in the care of this patient.   Total Time  30 minutes Prolonged Time Billed  no       Greater than 50%  of this time was spent counseling and coordinating care related to the above assessment and plan.  Haskel Khan, NP  Please contact Palliative Medicine Team phone at [phone] for questions and concerns.

## 2021-07-09 NOTE — Care Management Important Message (Signed)
Important Message  Patient Details  Name: Donna Patrick MRN: [id number] Date of Birth: 05/09/1951   Medicare Important Message Given:  Yes     Wadie Lessen 07/09/2021, 1:46 PM

## 2021-07-09 NOTE — Progress Notes (Signed)
PT Cancellation Note  Patient Details Name: Donna Patrick MRN: [id number] DOB: Sep 26, 1950   Cancelled Treatment:    Reason Eval/Treat Not Completed: Other (comment) (Order discontinued. Pt going to [facility] per chart.)   Bevelyn Buckles 07/09/2021, 1:41 PM Chukwuemeka Artola M,PT Acute Rehab Services [phone] (pager)

## 2021-07-09 NOTE — Progress Notes (Signed)
PROGRESS NOTE    Donna Patrick  [id number] DOB: May 19, 1951 DOA: 07/17/2021 PCP: Caren Macadam, MD   Brief Narrative: 52.  With past medical history significant for moderate dementia, recent loss of her daughter 3 weeks prior to admission,  patient became unresponsive 07/17/2021, she sustained a cardiac arrest, wide PEA.  Patient received CPR for 15 minutes and 1 round of epi.  She ultimately had ROSC.  On arrival to the ED she had a spontaneous breathing but minimal neurological function and was intubated.  CT head was negative for acute process.  Hospital events:  11/09; admitted for cardiac arrest, intubated in the ED 11/09; EEG concerning severe diffuse encephalopathy 11/11; continuous EEG suggestive of moderate to severe diffuse encephalopathy, MRI without acute abnormality.  Goals of care discussion with plan for one-way extubation and transition to DNR on extubation. 11/12; extubated.    Assessment & Plan:   Active Problems:   Cardiac arrest ([place])   Pressure injury of skin   Malnutrition of moderate degree   Acute hypoxemic respiratory failure ([place])   AKI (acute kidney injury) ([place])   Patient continue to have poor oral intake. Family has opted for comfort , hospice care. Hospice liaison with speak with family. Plan to discharge 11/22.  1-PEA, Cardia Arrest;  -Unclear etiology.  -ECHO: Ejection fraction 55 to [id number], diastolic dysfunction grade 1. -Mild elevation of troponin -No significant electrolytes abnormality on admission -Cardiology has been consulted, for further recommendations.   Hypokalemia: Resolved.   Obstructive uropathy, probably: Foley catheter placed 11/14.  She will need voiding trial in 2 weeks. -hold bethanechol.  -Continue with foley catheter.   Acute metabolic encephalopathy, Underline Mild dementia Anoxic brain injury.  -MRI 11/11: Negative for acute infarct, there is moderate atrophy.  Periventricular white matter hyperintensity may  represent chronic ischemia. -EEG; study suggestive of moderate to severe diffuse encephalopathy, nonspecific etiology but could be secondary to sedation, anoxic hypoxic brain injury.  No seizure or definite epileptiform discharge were seen throughout the recording.  -repeated CT head no acute changes.  ammonia level normal. , repeat TSH 29, increase synthroid. , B12 level elevated. . -Suspect  Delirium. -ABG negative for hypoxemia or hypercapnia.  -Continue with delirium and jerking movement. I have consulted neurology for further recommendation.  No further testing needed per neuro. We could try low dose risperidone to see if that will help reduce jerking.    HTN;  Sinus tachycardia; started on Toprol.  Continue with Norvasc.  BP better controlled.   Possible aspiration pneumonia: Received 5 days of cefepime and vancomycin. Patient will required Oxygen at HS>   Hypothyroidism: TSH on admission at 25.  Continue with Synthroid. Repeated TSH 29, Free T 3 low 1.9.  Increase synthroid to 125 to help with  hypothyroidism.   Stage I coccygeal pressure ulcer: Follow Wound care recommendation.  Transaminases: Abdominal ultrasound 11/9: Layering gallbladder sludge, without associated sonographic findings to suggest acute cholecystitis.  Otherwise negative abdominal ultrasound. -Resolved.   Moderate Malnutrition  On supplement.  No tube feeding or peg tube, patient wouldn't want artificial nutrition.        Nutrition Problem: Moderate Malnutrition Etiology: chronic illness (Alzheimer's dementia)    Signs/Symptoms: mild fat depletion, mild muscle depletion    Interventions: Ensure Enlive (each supplement provides 350kcal and 20 grams of protein)  Estimated body mass index is 22.14 kg/m as calculated from the following:   Height as of this encounter: 5\' 2"  (1.575 m).   Weight as of  this encounter: 54.9 kg.   DVT prophylaxis: Heparin Code Status: DNR Family Communication: Son  and daughter in law 11/19 Disposition Plan:  Status is: Inpatient  Remains inpatient appropriate because: Patient with probably delirium currently, plan for rehab when bed available, also cardiology consultation        Consultants:  CCM admitted patient EGD 11/16  Procedures:  Echo: Normal ejection fraction  Antimicrobials:    Subjective: Sleepy    Objective: Vitals:   07/08/21 2119 07/09/21 0423 07/09/21 0827 07/09/21 0831  BP: 123/78 126/69 (!) 141/73   Pulse: 79 76 76   Resp: 18 17 18    Temp: 98.5 F (36.9 C) 97.8 F (36.6 C)  97.6 F (36.4 C)  TempSrc: Axillary Axillary    SpO2: 96% 99% 97%   Weight:  54.9 kg    Height:        Intake/Output Summary (Last 24 hours) at 07/09/2021 1326 Last data filed at 07/09/2021 1256 Gross per 24 hour  Intake 1540.45 ml  Output 2950 ml  Net -1409.55 ml    Filed Weights   07/07/21 0500 07/08/21 0500 07/09/21 0423  Weight: 56.3 kg 55.5 kg 54.9 kg    Examination:  General exam: NAD Respiratory system: CTA Cardiovascular system: S 1, S 2 RRR Gastrointestinal system: BS present, soft, nt Central nervous system: Alert,  Extremities: no edema  Data Reviewed: I have personally reviewed following labs and imaging studies  CBC: Recent Labs  Lab 07/03/21 0206 07/05/21 1056 07/05/21 1217  WBC 8.8  --  10.4  HGB 11.9* 12.2 12.5  HCT 35.2* 36.0 37.1  MCV 88.0  --  89.0  PLT 252  --  340    Basic Metabolic Panel: Recent Labs  Lab 07/03/21 0206 07/04/21 0207 07/05/21 0115 07/05/21 1056 07/08/21 0115  NA 135 133* 136 136 135  K 3.0* 3.3* 3.8 4.7 3.5  CL 100 99 102  --  99  CO2 25 25 26   --  29  GLUCOSE 98 117* 115*  --  104*  BUN 11 12 12   --  7*  CREATININE 0.68 0.67 0.66  --  0.75  CALCIUM 8.8* 8.8* 8.9  --  8.9  MG  --  2.2 2.2  --   --     GFR: Estimated Creatinine Clearance: 53.2 mL/min (by C-G formula based on SCr of 0.75 mg/dL). Liver Function Tests: Recent Labs  Lab 07/03/21 0206  07/04/21 0207  AST 30 22  ALT 35 29  ALKPHOS 115 113  BILITOT 0.7 0.4  PROT 5.6* 5.8*  ALBUMIN 3.0* 3.1*    No results for input(s): LIPASE, AMYLASE in the last 168 hours. Recent Labs  Lab 07/04/21 1012  AMMONIA 18    Coagulation Profile: No results for input(s): INR, PROTIME in the last 168 hours. Cardiac Enzymes: No results for input(s): CKTOTAL, CKMB, CKMBINDEX, TROPONINI in the last 168 hours. BNP (last 3 results) No results for input(s): PROBNP in the last 8760 hours. HbA1C: No results for input(s): HGBA1C in the last 72 hours. CBG: Recent Labs  Lab 07/08/21 0741 07/08/21 1138 07/08/21 1647 07/09/21 0830 07/09/21 1130  GLUCAP 99 111* 128* 109* 104*    Lipid Profile: No results for input(s): CHOL, HDL, LDLCALC, TRIG, CHOLHDL, LDLDIRECT in the last 72 hours. Thyroid Function Tests: No results for input(s): TSH, T4TOTAL, FREET4, T3FREE, THYROIDAB in the last 72 hours.  Anemia Panel: No results for input(s): VITAMINB12, FOLATE, FERRITIN, TIBC, IRON, RETICCTPCT in the last 72 hours.  Sepsis Labs: No results for input(s): PROCALCITON, LATICACIDVEN in the last 168 hours.   Recent Results (from the past 240 hour(s))  SARS CORONAVIRUS 2 (TAT 6-24 HRS) Nasopharyngeal Nasopharyngeal Swab     Status: None   Collection Time: 07/03/21  4:43 PM   Specimen: Nasopharyngeal Swab  Result Value Ref Range Status   SARS Coronavirus 2 NEGATIVE NEGATIVE Final    Comment: (NOTE) SARS-CoV-2 target nucleic acids are NOT DETECTED.  The SARS-CoV-2 RNA is generally detectable in upper and lower respiratory specimens during the acute phase of infection. Negative results do not preclude SARS-CoV-2 infection, do not rule out co-infections with other pathogens, and should not be used as the sole basis for treatment or other patient management decisions. Negative results must be combined with clinical observations, patient history, and epidemiological information. The  expected result is Negative.  Fact Sheet for Patients: SugarRoll.be  Fact Sheet for Healthcare Providers: https://www.woods-mathews.com/  This test is not yet approved or cleared by the [place] FDA and  has been authorized for detection and/or diagnosis of SARS-CoV-2 by FDA under an Emergency Use Authorization (EUA). This EUA will remain  in effect (meaning this test can be used) for the duration of the COVID-19 declaration under Se ction 564(b)(1) of the Act, 21 U.S.C. section 360bbb-3(b)(1), unless the authorization is terminated or revoked sooner.  Performed at [facility], [place] [address], [place], [place] [address]           Radiology Studies: No results found.      Scheduled Meds:  amLODipine  5 mg Oral Daily   antiseptic oral rinse  15 mL Topical BID   Chlorhexidine Gluconate Cloth  6 each Topical Q0600   donepezil  5 mg Oral QHS   insulin aspart  0-15 Units Subcutaneous TID WC & HS   levothyroxine  125 mcg Oral Daily   LORazepam  0.5 mg Intravenous Q6H   memantine  5 mg Oral BID   metoprolol succinate  12.5 mg Oral Daily   polyethylene glycol  17 g Oral Daily   potassium chloride  40 mEq Oral Daily   risperiDONE  0.25 mg Oral QHS   Continuous Infusions:      LOS: 12 days    Time spent: 35 minutes.     Elmarie Shiley, MD [facility]   If 7PM-7AM, please contact night-coverage [email or web address]  07/09/2021, 1:26 PM

## 2021-07-10 DIAGNOSIS — N179 Acute kidney failure, unspecified: Secondary | ICD-10-CM | POA: Diagnosis not present

## 2021-07-10 DIAGNOSIS — I469 Cardiac arrest, cause unspecified: Secondary | ICD-10-CM | POA: Diagnosis not present

## 2021-07-10 DIAGNOSIS — J9601 Acute respiratory failure with hypoxia: Secondary | ICD-10-CM | POA: Diagnosis not present

## 2021-07-10 DIAGNOSIS — R451 Restlessness and agitation: Secondary | ICD-10-CM | POA: Diagnosis not present

## 2021-07-10 MED ORDER — LORAZEPAM 2 MG/ML IJ SOLN
1.0000 mg | Freq: Four times a day (QID) | INTRAMUSCULAR | 0 refills | Status: AC
Start: 2021-07-10 — End: ?

## 2021-07-10 MED ORDER — MORPHINE SULFATE (PF) 2 MG/ML IV SOLN
1.0000 mg | INTRAVENOUS | Status: DC | PRN
  Administered 2021-07-10: 1 mg via INTRAVENOUS
  Filled 2021-07-10: qty 1

## 2021-07-10 MED ORDER — LORAZEPAM 2 MG/ML IJ SOLN
1.0000 mg | Freq: Four times a day (QID) | INTRAMUSCULAR | Status: DC
  Administered 2021-07-10 – 2021-07-11 (×3): 2 mg via INTRAVENOUS
  Filled 2021-07-10 (×3): qty 1

## 2021-07-10 MED ORDER — GLYCOPYRROLATE 1 MG PO TABS: 1.0000 mg | ORAL_TABLET | ORAL | 0 refills | Status: AC | PRN

## 2021-07-10 MED ORDER — MORPHINE 100MG IN NS 100ML (1MG/ML) PREMIX INFUSION
2.0000 mg/h | INTRAVENOUS | Status: DC
  Administered 2021-07-10: 2 mg/h via INTRAVENOUS
  Filled 2021-07-10: qty 100

## 2021-07-10 MED ORDER — HALOPERIDOL 2 MG PO TABS: 2.0000 mg | ORAL_TABLET | Freq: Four times a day (QID) | ORAL | 0 refills | Status: AC | PRN

## 2021-07-10 MED ORDER — MORPHINE BOLUS VIA INFUSION
2.0000 mg | INTRAVENOUS | Status: DC | PRN
  Administered 2021-07-10: 2 mg via INTRAVENOUS
  Filled 2021-07-10: qty 2

## 2021-07-10 NOTE — Discharge Summary (Signed)
Physician Discharge Summary  [name] [id number] DOB: 1951-04-06 DOA: 07/16/2021  PCP: Donna Macadam, MD  Admit date: 06/29/2021 Discharge date: 07/10/2021  Admitted From: Home  Disposition:  Residential Hospice.   Recommendations for Outpatient Follow-up:  Comfort care. Patient was started on morphine gtt, continue.    Discharge Condition: Guarded.  CODE STATUS:DNR Diet recommendation: Comfort feeding.   Brief/Interim Summary: 32.  With past medical history significant for moderate dementia, recent loss of her daughter 3 weeks prior to admission,  patient became unresponsive 07/09/2021, she sustained a cardiac arrest, wide PEA.  Patient received CPR for 15 minutes and 1 round of epi.  She ultimately had ROSC.  On arrival to the ED she had a spontaneous breathing but minimal neurological function and was intubated.  CT head was negative for acute process.   Hospital events;   11/09; admitted for cardiac arrest, intubated in the ED 11/09; EEG concerning severe diffuse encephalopathy 11/11; continuous EEG suggestive of moderate to severe diffuse encephalopathy, MRI without acute abnormality.  Goals of care discussion with plan for one-way extubation and transition to DNR on extubation. 11/12; extubated  Patient continue to have poor oral intake, her mental status didn't improved. Family discussed with palliative care, trajectory of diseases and patient condition. Patient not eating, per prior patient wishes, she wouldn't want artificial nutrition. Patient was transition to Comfort care. Patient continue to decline. She was notice to have some agitation and notice to be uncomfortable, after IV ativan and porphine patient became more calm and appears more comfortable.    Plan to transfer to residential hospice today.   1-PEA, Cardia Arrest;  -Unclear etiology.  -ECHO: Ejection fraction 55 to [id number], diastolic dysfunction grade 1. -Mild elevation of troponin -No significant  electrolytes abnormality on admission -Cardiology has been consulted, for further recommendations.   -Comfort care.   Hypokalemia: Resolved.    Obstructive uropathy, probably: Foley catheter placed 11/14.  She will need voiding trial in 2 weeks. -hold bethanechol.  -she will need voiding trial.    Acute metabolic encephalopathy, Underline Dementia Anoxic brain injury.  -MRI 11/11: Negative for acute infarct, there is moderate atrophy.  Periventricular white matter hyperintensity may represent chronic ischemia. -EEG; study suggestive of moderate to severe diffuse encephalopathy, nonspecific etiology but could be secondary to sedation, anoxic hypoxic brain injury.  No seizure or definite epileptiform discharge were seen throughout the recording.  -repeated CT head no acute changes.  ammonia level normal. , repeat TSH 29, increase synthroid. , B12 level elevated. . Suspect  Delirium. -ABG negative for hypoxemia or hypercapnia.  -Continue with delirium and jerking movement. I have consulted neurology for further recommendation.  No further recommendations. Plan to resume lower home dose risperidone in case myoclonus are related to risperidone withdrawal. Myoclonus might be related to Hypoxia from cardiac arrest.    HTN;  Sinus tachycardia; started on Toprol.  Continue with Norvasc.  BP better controlled.    Possible aspiration pneumonia: Received 5 days of cefepime and vancomycin. Patient will required Oxygen at HS>    Hypothyroidism: TSH on admission at 25.  Continue with Synthroid. Repeated TSH 29, Free T 3 low 1.9.  Increase synthroid to 125 to help with hypothyroidism.    Stage I coccygeal pressure ulcer: Follow Wound care recommendation.   Transaminases: Abdominal ultrasound 11/9: Layering gallbladder sludge, without associated sonographic findings to suggest acute cholecystitis.  Otherwise negative abdominal ultrasound. -Resolved.    Dementia.  Moderate Malnutrition  No  oral intake in  several days.  No artificial nutrition, per prior patient wishes.       Discharge Diagnoses:  Active Problems:   Cardiac arrest (HCC)   Pressure injury of skin   Malnutrition of moderate degree   Acute hypoxemic respiratory failure (HCC)   AKI (acute kidney injury) [facility])    Discharge Instructions  Discharge Instructions     Diet - low sodium heart healthy   Complete by: As directed    Discharge wound care:   Complete by: As directed    See above   Discharge wound care:   Complete by: As directed    See above   Increase activity slowly   Complete by: As directed    Increase activity slowly   Complete by: As directed    No wound care   Complete by: As directed       Allergies as of 07/10/2021   No Known Allergies      Medication List     STOP taking these medications    hydrOXYzine 25 MG tablet Commonly known as: ATARAX/VISTARIL   LORazepam 0.5 MG tablet Commonly known as: ATIVAN Replaced by: LORazepam 2 MG/ML injection   oxybutynin 10 MG 24 hr tablet Commonly known as: DITROPAN-XL   potassium chloride 10 MEQ tablet Commonly known as: KLOR-CON   simvastatin 40 MG tablet Commonly known as: ZOCOR   sulfamethoxazole-trimethoprim 800-160 MG tablet Commonly known as: BACTRIM DS   traZODone 50 MG tablet Commonly known as: DESYREL       TAKE these medications    cholecalciferol 25 MCG (1000 UNIT) tablet Commonly known as: VITAMIN D3 Take 2,000 Units by mouth daily.   donepezil 5 MG tablet Commonly known as: ARICEPT TAKE 1 TABLET(5 MG) BY MOUTH AT BEDTIME What changed: See the new instructions.   glycopyrrolate 1 MG tablet Commonly known as: ROBINUL Take 1 tablet (1 mg total) by mouth every 4 (four) hours as needed (excessive secretions).   haloperidol 2 MG tablet Commonly known as: HALDOL Take 1 tablet (2 mg total) by mouth every 6 (six) hours as needed for agitation (or delirium).   levothyroxine 125 MCG tablet Commonly  known as: SYNTHROID Take 1 tablet (125 mcg total) by mouth daily. What changed:  medication strength how much to take   LORazepam 2 MG/ML injection Commonly known as: ATIVAN Inject 0.5-1 mLs (1-2 mg total) into the vein every 6 (six) hours. Replaces: LORazepam 0.5 MG tablet   meloxicam 7.5 MG tablet Commonly known as: MOBIC Take 1 tablet (7.5 mg total) by mouth daily as needed for pain.   memantine 5 MG tablet Commonly known as: NAMENDA Take 1 tablet (5 mg total) by mouth 2 (two) times daily. What changed:  medication strength See the new instructions.   metoprolol succinate 25 MG 24 hr tablet Commonly known as: TOPROL-XL Take 0.5 tablets (12.5 mg total) by mouth daily.   multivitamin with minerals Tabs tablet Take 1 tablet by mouth daily.   risperiDONE 1 MG tablet Commonly known as: RisperDAL Take 0.5 tablets (0.5 mg total) by mouth at bedtime. What changed:  how much to take when to take this   triamcinolone ointment 0.1 % Commonly known as: KENALOG Apply 1 application topically 2 (two) times daily as needed (rash).   trospium 20 MG tablet Commonly known as: SANCTURA Take 1 tablet (20 mg total) by mouth 2 (two) times daily.               Discharge Care Instructions  (  From admission, onward)           Start     Ordered   07/06/21 0000  Discharge wound care:       Comments: See above   07/06/21 1407   07/05/21 0000  Discharge wound care:       Comments: See above   07/05/21 1115            No Known Allergies  Consultations: Neurology CCM   Procedures/Studies: DG Abd 1 View  Result Date: 06/29/2021 CLINICAL DATA:  Emesis EXAM: ABDOMEN - 1 VIEW COMPARISON:  05/28/2021 FINDINGS: Gastric tube tip and side port are below the GE junction. Nonobstructive bowel gas pattern. Supine technique limits evaluation for intraperitoneal free air. S shaped curvature of the thoracolumbar spine. No acute osseous abnormality. IMPRESSION: Nonobstructive  bowel gas pattern. Electronically Signed   By: Merilyn Baba M.D.   On: 06/29/2021 13:21   DG Abd 1 View  Result Date: 06/28/2021 CLINICAL DATA:  OG tube placement EXAM: ABDOMEN - 1 VIEW COMPARISON:  None. FINDINGS: NG tube within stomach. Side port below the GE junction. Nonobstructive bowel gas pattern IMPRESSION: IMPRESSION NG tube in stomach Electronically Signed   By: Suzy Bouchard M.D.   On: 06/28/2021 10:11   CT HEAD WO CONTRAST (5MM)  Result Date: 07/04/2021 CLINICAL DATA:  70 year old female with history of delirium. EXAM: CT HEAD WITHOUT CONTRAST TECHNIQUE: Contiguous axial images were obtained from the base of the skull through the vertex without intravenous contrast. COMPARISON:  Head CT 06/29/2021. FINDINGS: Brain: Moderate cerebral atrophy with ex vacuo dilatation of the ventricular system. Patchy and confluent areas of decreased attenuation are noted throughout the deep and periventricular white matter of the cerebral hemispheres bilaterally, compatible with chronic microvascular ischemic disease. No evidence of acute infarction, hemorrhage, hydrocephalus, extra-axial collection or mass lesion/mass effect. Vascular: No hyperdense vessel or unexpected calcification. Skull: Normal. Negative for fracture or focal lesion. Sinuses/Orbits: No acute finding. Other: None. IMPRESSION: 1. No acute intracranial abnormalities. 2. Moderate cerebral atrophy with extensive chronic microvascular ischemic changes in the cerebral white matter, as above. Electronically Signed   By: Vinnie Langton M.D.   On: 07/04/2021 11:25   CT HEAD WO CONTRAST (5MM)  Result Date: 07/06/2021 CLINICAL DATA:  Mental status change, unknown cause EXAM: CT HEAD WITHOUT CONTRAST TECHNIQUE: Contiguous axial images were obtained from the base of the skull through the vertex without intravenous contrast. COMPARISON:  06/17/2021 FINDINGS: Brain: No evidence of acute infarction, hemorrhage, hydrocephalus, extra-axial collection  or mass lesion/mass effect. Moderate low-density changes within the periventricular and subcortical white matter compatible with chronic microvascular ischemic change. Mild diffuse cerebral volume loss. Vascular: No hyperdense vessel or unexpected calcification. Skull: Normal. Negative for fracture or focal lesion. Sinuses/Orbits: No acute finding. Other: Partially visualized tubing within the posterior oropharynx, likely orogastric tube. IMPRESSION: 1. No acute intracranial findings. 2. Chronic microvascular ischemic change and cerebral volume loss. Electronically Signed   By: Davina Poke D.O.   On: 06/26/2021 11:39   CT Head Wo Contrast  Result Date: 06/17/2021 CLINICAL DATA:  Altered mental status. EXAM: CT HEAD WITHOUT CONTRAST TECHNIQUE: Contiguous axial images were obtained from the base of the skull through the vertex without intravenous contrast. COMPARISON:  05/19/2021 FINDINGS: Brain: No evidence of acute infarction, hemorrhage, extra-axial collection, ventriculomegaly, or mass effect. Generalized cerebral atrophy. Periventricular white matter low attenuation likely secondary to microangiopathy. Vascular: No hyperdense vessel or unexpected calcifications. Skull: Negative for fracture or focal lesion. Sinuses/Orbits: Visualized  portions of the orbits are unremarkable. Visualized portions of the paranasal sinuses are unremarkable. Visualized portions of the mastoid air cells are unremarkable. Other: None. IMPRESSION: 1. No acute intracranial pathology. 2. Chronic microvascular disease and cerebral atrophy. Electronically Signed   By: Kathreen Devoid M.D.   On: 06/17/2021 17:01   MR BRAIN WO CONTRAST  Result Date: 06/29/2021 CLINICAL DATA:  Anoxic brain injury.  Cardiac arrest at home EXAM: MRI HEAD WITHOUT CONTRAST TECHNIQUE: Multiplanar, multiecho pulse sequences of the brain and surrounding structures were obtained without intravenous contrast. COMPARISON:  CT head 07/08/2021 FINDINGS: Brain:  Negative for acute infarct.  No areas of restricted diffusion Moderate atrophy. Atrophy most prominent in the frontal and temporal lobes. Ventricular enlargement consistent with atrophy. Periventricular deep white matter hyperintensity diffusely may represent chronic ischemia. No hemorrhage or mass identified. Vascular: Normal arterial flow voids Skull and upper cervical spine: No focal skeletal lesion. Sinuses/Orbits: Mild mucosal edema paranasal sinuses. Negative orbit Other: None IMPRESSION: Negative for acute infarct. There is moderate atrophy. Periventricular white matter hyperintensity may represent chronic ischemia. Electronically Signed   By: Franchot Gallo M.D.   On: 06/29/2021 15:14   US Abdomen Complete  Result Date: 06/28/2021 CLINICAL DATA:  Acute kidney injury, elevated LFTs, cardiac arrest with unclear etiology EXAM: ABDOMEN ULTRASOUND COMPLETE COMPARISON:  None. FINDINGS: Gallbladder: Layering gallbladder sludge. No gallstones or gallbladder wall thickening. Common bile duct: Diameter: 3 mm Liver: No focal lesion identified. Within normal limits in parenchymal echogenicity. Portal vein is patent on color Doppler imaging with normal direction of blood flow towards the liver. IVC: No abnormality visualized. Pancreas: Visualized portion unremarkable. Spleen: Size and appearance within normal limits. Right Kidney: Length: 9.0 cm. Echogenicity within normal limits. No mass or hydronephrosis visualized. Left Kidney: Length: 8.8 cm. Echogenicity within normal limits. No mass or hydronephrosis visualized. Abdominal aorta: No aneurysm visualized. Other findings: None. IMPRESSION: Layering gallbladder sludge, without associated sonographic findings to suggest acute cholecystitis. Otherwise negative abdominal ultrasound. Electronically Signed   By: Julian Hy M.D.   On: 06/28/2021 00:11   DG CHEST PORT 1 VIEW  Result Date: 06/28/2021 CLINICAL DATA:  Intubated EXAM: PORTABLE CHEST 1 VIEW  COMPARISON:  07/03/2021 FINDINGS: Endotracheal tube tip is about 4.4 cm superior to carina. Esophageal tube tip below the diaphragm but incompletely visualized. Clear lung fields. Normal cardiomediastinal silhouette. No pneumothorax. IMPRESSION: 1. Endotracheal tube tip about 4.4 cm superior to carina. 2. Clear lung fields Electronically Signed   By: Donavan Foil M.D.   On: 06/28/2021 19:16   DG Chest Portable 1 View  Result Date: 07/16/2021 CLINICAL DATA:  Cardiac arrest, status post CPR EXAM: PORTABLE CHEST 1 VIEW COMPARISON:  05/19/2021 FINDINGS: Endotracheal tube terminates 11 mm above the carina. Nasogastric tube terminates at the body of the stomach with side port below the gastroesophageal junction. External pacer/defibrillator. Normal heart size. Atherosclerosis in the transverse aorta. No pleural effusion or pneumothorax. Perihilar and slightly upper lobe predominant interstitial and airspace disease centrally. IMPRESSION: Interstitial and airspace disease is mild, suspicious for pulmonary edema. Early infection or aspiration could look similar. Endotracheal tube tip 11 mm above the carina. Consider retraction 3-4 cm. Aortic Atherosclerosis (ICD10-I70.0). Electronically Signed   By: Abigail Miyamoto M.D.   On: 07/04/2021 10:44   EEG adult  Result Date: 06/21/2021 Lora Havens, MD     06/24/2021  8:43 PM Patient Name: Donna Patrick MRN: [id number] Epilepsy Attending: Lora Havens Referring Physician/Provider: Omar Person, NP Date: 06/19/2021 Duration:  22.32 mins Patient history: 70 year old female who presented to the emergency department status post PEA cardiac arrest after she became unresponsive in the bathroom, witnessed by nephew, who started CPR, ROSC was achieved after 15 minutes, she received 1 amp of epi. EEG to evaluate for seizure Level of alertness:comatose AEDs during EEG study: LEV, propofol Technical aspects: This EEG study was done with scalp electrodes positioned  according to the 10-20 International system of electrode placement. Electrical activity was acquired at a sampling rate of 500Hz  and reviewed with a high frequency filter of 70Hz  and a low frequency filter of 1Hz . EEG data were recorded continuously and digitally stored. Description: EEG showed continuous generalized low amplitude 1-3 Hz delta slowing. EEG was reactive to noxious stimulation and showed 5-6 Hz theta  slowing. Hyperventilation and photic stimulation were not performed.   ABNORMALITY - Continuous slow, generalized IMPRESSION: This study is suggestive of severe diffuse encephalopathy, nonspecific etiology but likely related to sedation, anoxic/hypoxic brain injury. No seizures or epileptiform discharges were seen throughout the recording. Priyanka Barbra Sarks   Overnight EEG with video  Result Date: 06/28/2021 Lora Havens, MD     06/29/2021  8:47 AM Patient Name: RUBICELA FERO MRN: [id number] Epilepsy Attending: Lora Havens Referring Physician/Provider: Omar Person, NP Duration: 06/26/2021 1803 to 06/28/2021 1803  Patient history: 70 year old female who presented to the emergency department status post PEA cardiac arrest after she became unresponsive in the bathroom, witnessed by nephew, who started CPR, ROSC was achieved after 15 minutes, she received 1 amp of epi. EEG to evaluate for seizure  Level of alertness:comatose  AEDs during EEG study: LEV  Technical aspects: This EEG study was done with scalp electrodes positioned according to the 10-20 International system of electrode placement. Electrical activity was acquired at a sampling rate of 500Hz  and reviewed with a high frequency filter of 70Hz  and a low frequency filter of 1Hz . EEG data were recorded continuously and digitally stored.  Description: EEG showed near continuous generalized 1-3 Hz delta slowing which at times appears sharply contoured and rhythmic. Intermittent 5-9 Hz theta-alpha activity. Hyperventilation and  photic stimulation were not performed.    ABNORMALITY - Continuous slow, generalized  IMPRESSION: This study is suggestive of moderate to severe diffuse encephalopathy, nonspecific etiology but could be secondary to sedation, anoxic/hypoxic brain injury. No seizures or definite epileptiform discharges were seen throughout the recording.  Lora Havens   ECHOCARDIOGRAM COMPLETE  Result Date: 07/08/2021    ECHOCARDIOGRAM REPORT   Patient Name:   TAWNNI BESTER Date of Exam: 06/26/2021 Medical Rec #:  [id number]       Height:       62.0 in Accession #:    [id number]      Weight:       145.5 lb Date of Birth:  1951/04/15        BSA:          1.670 m Patient Age:    70 years        BP:           89/37 mmHg Patient Gender: F               HR:           101 bpm. Exam Location:  Inpatient Procedure: 2D Echo, Cardiac Doppler and Color Doppler Indications:    Cardiac Arrest  History:        Patient has no prior history of Echocardiogram examinations.  Sonographer:  Merrie Roof RDCS Referring Phys: [place]  1. Left ventricular ejection fraction, by estimation, is 55 to 60%. The left ventricle has normal function. The left ventricle has no regional wall motion abnormalities. Left ventricular diastolic parameters are consistent with Grade I diastolic dysfunction (impaired relaxation).  2. Right ventricular systolic function is normal. The right ventricular size is normal. There is moderately elevated pulmonary artery systolic pressure. The estimated right ventricular systolic pressure is 0000000 mmHg.  3. The mitral valve is normal in structure. No evidence of mitral valve regurgitation. No evidence of mitral stenosis.  4. The aortic valve is tricuspid. Aortic valve regurgitation is not visualized. No aortic stenosis is present.  5. The inferior vena cava is dilated in size with <50% respiratory variability, suggesting right atrial pressure of 15 mmHg.  6. A small pericardial effusion is present.  FINDINGS  Left Ventricle: Left ventricular ejection fraction, by estimation, is 55 to 60%. The left ventricle has normal function. The left ventricle has no regional wall motion abnormalities. The left ventricular internal cavity size was normal in size. There is  no left ventricular hypertrophy. Left ventricular diastolic parameters are consistent with Grade I diastolic dysfunction (impaired relaxation). Right Ventricle: The right ventricular size is normal. No increase in right ventricular wall thickness. Right ventricular systolic function is normal. There is moderately elevated pulmonary artery systolic pressure. The tricuspid regurgitant velocity is 3.06 m/s, and with an assumed right atrial pressure of 15 mmHg, the estimated right ventricular systolic pressure is 0000000 mmHg. Left Atrium: Left atrial size was normal in size. Right Atrium: Right atrial size was normal in size. Pericardium: A small pericardial effusion is present. Mitral Valve: The mitral valve is normal in structure. No evidence of mitral valve regurgitation. No evidence of mitral valve stenosis. Tricuspid Valve: The tricuspid valve is normal in structure. Tricuspid valve regurgitation is not demonstrated. Aortic Valve: The aortic valve is tricuspid. Aortic valve regurgitation is not visualized. No aortic stenosis is present. Aortic valve mean gradient measures 4.0 mmHg. Aortic valve peak gradient measures 6.7 mmHg. Aortic valve area, by VTI measures 2.18 cm. Pulmonic Valve: The pulmonic valve was normal in structure. Pulmonic valve regurgitation is trivial. Aorta: The aortic root is normal in size and structure. Venous: The inferior vena cava is dilated in size with less than 50% respiratory variability, suggesting right atrial pressure of 15 mmHg. IAS/Shunts: No atrial level shunt detected by color flow Doppler.  LEFT VENTRICLE PLAX 2D LVIDd:         3.80 cm   Diastology LVIDs:         2.30 cm   LV e' medial:    6.53 cm/s LV PW:         0.90  cm   LV E/e' medial:  10.1 LV IVS:        0.90 cm   LV e' lateral:   6.31 cm/s LVOT diam:     2.10 cm   LV E/e' lateral: 10.5 LV SV:         43 LV SV Index:   26 LVOT Area:     3.46 cm  RIGHT VENTRICLE             IVC RV Basal diam:  3.60 cm     IVC diam: 2.40 cm RV S prime:     14.10 cm/s TAPSE (M-mode): 1.3 cm LEFT ATRIUM           Index  RIGHT ATRIUM          Index LA diam:      2.30 cm 1.38 cm/m   RA Area:     9.72 cm LA Vol (A4C): 19.8 ml 11.86 ml/m  RA Volume:   18.90 ml 11.32 ml/m  AORTIC VALVE AV Area (Vmax):    2.37 cm AV Area (Vmean):   2.29 cm AV Area (VTI):     2.18 cm AV Vmax:           129.00 cm/s AV Vmean:          86.100 cm/s AV VTI:            0.197 m AV Peak Grad:      6.7 mmHg AV Mean Grad:      4.0 mmHg LVOT Vmax:         88.30 cm/s LVOT Vmean:        57.000 cm/s LVOT VTI:          0.124 m LVOT/AV VTI ratio: 0.63  AORTA Ao Root diam: 2.50 cm Ao Asc diam:  3.20 cm MITRAL VALVE               TRICUSPID VALVE MV Area (PHT): 4.63 cm    TR Peak grad:   37.5 mmHg MV Decel Time: 164 msec    TR Vmax:        306.00 cm/s MV E velocity: 66.00 cm/s MV A velocity: 96.80 cm/s  SHUNTS MV E/A ratio:  0.68        Systemic VTI:  0.12 m                            Systemic Diam: 2.10 cm Dalton McleanMD Electronically signed by Franki Monte Signature Date/Time: 07/07/2021/7:10:43 PM    Final    VAS [place] LOWER EXTREMITY VENOUS (DVT)  Result Date: 06/28/2021  Lower Venous DVT Study Patient Name:  Donna Patrick  Date of Exam:   07/06/2021 Medical Rec #: [id number]        Accession #:    [id number] Date of Birth: [birth year]         Patient Gender: F Patient Age:   79 years Exam [facility]  [facility] Procedure:      VAS [place] LOWER EXTREMITY VENOUS (DVT) Referring Phys: Hayden Pedro --------------------------------------------------------------------------------  Indications: Edema.  Comparison Study: no prior Performing Technologist: Archie Patten RVS  Examination Guidelines: A complete  evaluation includes B-mode imaging, spectral Doppler, color Doppler, and power Doppler as needed of all accessible portions of each vessel. Bilateral testing is considered an integral part of a complete examination. Limited examinations for reoccurring indications may be performed as noted. The reflux portion of the exam is performed with the patient in reverse Trendelenburg.  +---------+---------------+---------+-----------+----------+--------------+ RIGHT    CompressibilityPhasicitySpontaneityPropertiesThrombus Aging +---------+---------------+---------+-----------+----------+--------------+ CFV      Full           Yes      No                                  +---------+---------------+---------+-----------+----------+--------------+ SFJ      Full                                                        +---------+---------------+---------+-----------+----------+--------------+  FV Prox  Full                                                        +---------+---------------+---------+-----------+----------+--------------+ FV Mid   Full                                                        +---------+---------------+---------+-----------+----------+--------------+ FV DistalFull                                                        +---------+---------------+---------+-----------+----------+--------------+ PFV      Full                                                        +---------+---------------+---------+-----------+----------+--------------+ POP      Full           Yes      Yes                                 +---------+---------------+---------+-----------+----------+--------------+ PTV      Full                                                        +---------+---------------+---------+-----------+----------+--------------+ PERO     Full                                                         +---------+---------------+---------+-----------+----------+--------------+   +---------+---------------+---------+-----------+----------+--------------+ LEFT     CompressibilityPhasicitySpontaneityPropertiesThrombus Aging +---------+---------------+---------+-----------+----------+--------------+ CFV      Full           Yes      Yes                                 +---------+---------------+---------+-----------+----------+--------------+ SFJ      Full                                                        +---------+---------------+---------+-----------+----------+--------------+ FV Prox  Full                                                        +---------+---------------+---------+-----------+----------+--------------+  FV Mid   Full                                                        +---------+---------------+---------+-----------+----------+--------------+ FV DistalFull                                                        +---------+---------------+---------+-----------+----------+--------------+ PFV      Full                                                        +---------+---------------+---------+-----------+----------+--------------+ POP      Full           Yes      Yes                                 +---------+---------------+---------+-----------+----------+--------------+ PTV      Full                                                        +---------+---------------+---------+-----------+----------+--------------+ PERO     Full                                                        +---------+---------------+---------+-----------+----------+--------------+     Summary: BILATERAL: - No evidence of deep vein thrombosis seen in the lower extremities, bilaterally. -No evidence of popliteal cyst, bilaterally.   *See table(s) above for measurements and observations. Electronically signed by Monica Martinez MD on 06/28/2021 at  3:27:15 PM.    Final      Subjective: She is sleepy, came to check on patient after she received morphine and ativan and she appears more comfortable.   Discharge Exam: Vitals:   07/09/21 1650 07/10/21 0517  BP: 130/71 94/83  Pulse: 82 78  Resp: 18 17  Temp:  98.6 F (37 C)  SpO2: 96%      General: NAD, lethargic Cardiovascular: RRR, S1/S2  Respiratory: CTA bilaterally, no wheezing, no rhonchi Abdominal: Soft, NT, ND, bowel sounds + Extremities: no edema, no cyanosis    The results of significant diagnostics from this hospitalization (including imaging, microbiology, ancillary and laboratory) are listed below for reference.     Microbiology: Recent Results (from the past 240 hour(s))  SARS CORONAVIRUS 2 (TAT 6-24 HRS) Nasopharyngeal Nasopharyngeal Swab     Status: None   Collection Time: 07/03/21  4:43 PM   Specimen: Nasopharyngeal Swab  Result Value Ref Range Status   SARS Coronavirus 2 NEGATIVE NEGATIVE Final    Comment: (NOTE) SARS-CoV-2 target nucleic acids are NOT DETECTED.  The SARS-CoV-2 RNA is generally detectable in  upper and lower respiratory specimens during the acute phase of infection. Negative results do not preclude SARS-CoV-2 infection, do not rule out co-infections with other pathogens, and should not be used as the sole basis for treatment or other patient management decisions. Negative results must be combined with clinical observations, patient history, and epidemiological information. The expected result is Negative.  Fact Sheet for Patients: HairSlick.no  Fact Sheet for Healthcare Providers: quierodirigir.com  This test is not yet approved or cleared by the [place] FDA and  has been authorized for detection and/or diagnosis of SARS-CoV-2 by FDA under an Emergency Use Authorization (EUA). This EUA will remain  in effect (meaning this test can be used) for the duration of  the COVID-19 declaration under Se ction 564(b)(1) of the Act, 21 U.S.C. section 360bbb-3(b)(1), unless the authorization is terminated or revoked sooner.  Performed at [facility], [address], [place], [place] [address]      Labs: BNP (last 3 results) No results for input(s): BNP in the last 8760 hours. Basic Metabolic Panel: Recent Labs  Lab 07/04/21 0207 07/05/21 0115 07/05/21 1056 07/08/21 0115  NA 133* 136 136 135  K 3.3* 3.8 4.7 3.5  CL 99 102  --  99  CO2 25 26  --  29  GLUCOSE 117* 115*  --  104*  BUN 12 12  --  7*  CREATININE 0.67 0.66  --  0.75  CALCIUM 8.8* 8.9  --  8.9  MG 2.2 2.2  --   --     Liver Function Tests: Recent Labs  Lab 07/04/21 0207  AST 22  ALT 29  ALKPHOS 113  BILITOT 0.4  PROT 5.8*  ALBUMIN 3.1*    No results for input(s): LIPASE, AMYLASE in the last 168 hours. Recent Labs  Lab 07/04/21 1012  AMMONIA 18    CBC: Recent Labs  Lab 07/05/21 1056 07/05/21 1217  WBC  --  10.4  HGB 12.2 12.5  HCT 36.0 37.1  MCV  --  89.0  PLT  --  340    Cardiac Enzymes: No results for input(s): CKTOTAL, CKMB, CKMBINDEX, TROPONINI in the last 168 hours. BNP: Invalid input(s): POCBNP CBG: Recent Labs  Lab 07/08/21 1138 07/08/21 1647 07/09/21 0830 07/09/21 1130 07/09/21 1654  GLUCAP 111* 128* 109* 104* 108*    D-Dimer No results for input(s): DDIMER in the last 72 hours. Hgb A1c No results for input(s): HGBA1C in the last 72 hours. Lipid Profile No results for input(s): CHOL, HDL, LDLCALC, TRIG, CHOLHDL, LDLDIRECT in the last 72 hours. Thyroid function studies No results for input(s): TSH, T4TOTAL, T3FREE, THYROIDAB in the last 72 hours.  Invalid input(s): FREET3  Anemia work up No results for input(s): VITAMINB12, FOLATE, FERRITIN, TIBC, IRON, RETICCTPCT in the last 72 hours.  Urinalysis    Component Value Date/Time   COLORURINE YELLOW 06/24/2021 1022   APPEARANCEUR CLEAR 06/21/2021 1022   LABSPEC 1.013  07/10/2021 1022   PHURINE 5.0 07/12/2021 1022   GLUCOSEU NEGATIVE 07/17/2021 1022   GLUCOSEU NEGATIVE 03/02/2021 1426   HGBUR NEGATIVE 07/15/2021 1022   BILIRUBINUR NEGATIVE 06/25/2021 1022   KETONESUR NEGATIVE 07/17/2021 1022   PROTEINUR NEGATIVE 07/16/2021 1022   UROBILINOGEN 0.2 03/02/2021 1426   NITRITE NEGATIVE 07/04/2021 1022   LEUKOCYTESUR NEGATIVE 06/25/2021 1022   Sepsis Labs Invalid input(s): PROCALCITONIN,  WBC,  LACTICIDVEN Microbiology Recent Results (from the past 240 hour(s))  SARS CORONAVIRUS 2 (TAT 6-24 HRS) Nasopharyngeal Nasopharyngeal Swab  Status: None   Collection Time: 07/03/21  4:43 PM   Specimen: Nasopharyngeal Swab  Result Value Ref Range Status   SARS Coronavirus 2 NEGATIVE NEGATIVE Final    Comment: (NOTE) SARS-CoV-2 target nucleic acids are NOT DETECTED.  The SARS-CoV-2 RNA is generally detectable in upper and lower respiratory specimens during the acute phase of infection. Negative results do not preclude SARS-CoV-2 infection, do not rule out co-infections with other pathogens, and should not be used as the sole basis for treatment or other patient management decisions. Negative results must be combined with clinical observations, patient history, and epidemiological information. The expected result is Negative.  Fact Sheet for Patients: SugarRoll.be  Fact Sheet for Healthcare Providers: https://www.woods-mathews.com/  This test is not yet approved or cleared by the [place] FDA and  has been authorized for detection and/or diagnosis of SARS-CoV-2 by FDA under an Emergency Use Authorization (EUA). This EUA will remain  in effect (meaning this test can be used) for the duration of the COVID-19 declaration under Se ction 564(b)(1) of the Act, 21 U.S.C. section 360bbb-3(b)(1), unless the authorization is terminated or revoked sooner.  Performed at [facility], [place] [address]  [place], [place] [address]      Time coordinating discharge: 40 minutes  SIGNED:   Elmarie Shiley, MD  [facility]

## 2021-07-10 NOTE — Progress Notes (Signed)
Nutrition Brief Note  Chart reviewed; pt with continued PO intake. Family would like to proceed with transitioning to comfort care.   No further nutrition interventions planned at this time. Please re-consult as needed.   Kirby Crigler BS, PLDN Clinical Dietitian See [facility] for contact information.

## 2021-07-10 NOTE — Progress Notes (Signed)
Daily Progress Note   Patient Name: Donna Patrick       Date: 07/10/2021 DOB: June 20, 1951  Age: 70 y.o. MRN#: [id number] Attending Physician: Alba Cory, MD Primary Care Physician: Wynn Banker, MD Admit Date: Jul 05, 2021  Reason for Consultation/Follow-up: symptom management, end of life care  Subjective: PMT received a call from patient's son Rob with questions regarding visitation. I returned his call and answered his questions to the best of my ability. Rob expresses concern that Mrs. Pellicano seemed very "restless" to him when he visited earlier today. Soon after, I received a message from Dr. Carmell Austria that patient needed additional symptom management.   13:05 - I went to see patient at bedside. She appears uncomfortable and restless. Significant twitching/jerking of her extremities noted. Her eyes are closed and she is non-communicative. Her bedside RN reports she looks "better" than she did earlier. She gave 1 mg of ativan at 10:44 followed by 1 mg of morphine at 10:57.    Later I was notified by hospice liaison that family had decided to accept a bed at [facility] [place] since there is no bed available at [facility] today. However, they are requesting to speak with me first.  15:55 - I spoke with son Rob again by phone. I provided reassurance that I had adjusted patient's medications for symptom management to ensure comfort at EOL. I also let him know that patient appeared stable for transport. Provided education on the natural trajectory at EOL. Rob states that unfortunately [place] is simply too far for the family to travel (they all live in [place]). He states they will consider a bed at [facility] if it becomes available. Rob shares that his family has  suffered significant loss within the past 6 months with the death of his sister, sister-in-law, and an uncle. Emotional support provided.   I communicated to Dr. Sunnie Nielsen, hospice liaison, and [facility] team that the family has declined the bed at [facility].  Length of Stay: 13       Vital Signs: BP 94/83 (BP Location: Right Arm)   Pulse 78   Temp 98.6 F (37 C)   Resp 17   Ht 5\' 2"  (1.575 m)   Wt 54.9 kg   SpO2 96%   BMI 22.14 kg/m  SpO2: SpO2:  96 % O2 Device: O2 Device: Room Air   LBM: Last BM Date: 07/08/21 Baseline Weight: Weight: 67.6 kg Most recent weight: Weight: 54.9 kg       Palliative Assessment/Data: PPS 10%     Palliative Care Assessment & Plan   Patient Profile: 70 y.o. female  with past medical history of dementia presented to ED on 07/09/2021 from home after witnessed cardiac arrest. CPR was initiated by family and patient was found in PEA on EMS arrival. 15 minutes of CPR was performed before ROSC; however, on arrival to ED there was minimal neurological functioning and she was intubated. Patient was admitted on 07/01/2021 with PEA cardiac arrest with unclear etiology, respiratory insufficiency in setting of cardiac arrest, encephalopathy, AKI. Patient was extubated 06/30/21.   Assessment: Cardiac arrest Pressure injury of skin Malnutrition of moderate degree Acute hypoxic respiratory failure Dementia Terminal care  Recommendations/Plan: Continue full comfort measures Increase scheduled ativan to 1-2 mg every 6 hours Start morphine infusion  Administer morphine bolus (via infusion) for uncontrolled pain or dyspnea Family declines bed at hospice facility in [place], will wait for [facility]  Goals of Care and Additional Recommendations: Limitations on Scope of Treatment: Full Comfort Care  Code Status: DNR/DNI  Prognosis:  < 2 weeks  Discharge Planning: Hospice facility   Thank you for allowing the Palliative Medicine Team to assist  in the care of this patient.   Total Time 65 minutes Prolonged Time Billed  yes       Greater than 50%  of this time was spent counseling and coordinating care related to the above assessment and plan.  Merry Proud, NP  Please contact Palliative Medicine Team phone at [phone] for questions and concerns.

## 2021-07-10 NOTE — Care Management (Addendum)
PTAR paperwork for transport to [facility] in patient's chart. [facility] will let NCM / nurse know when family completes  paperwork, so PTAR can be called.   [facility] requesting transport after 6 pm.   1628 Received a call from Chales Abrahams with [facility], patient's son has decided he would rather have his mother at [facility] instead of [facility] [address]

## 2021-07-10 NOTE — Progress Notes (Signed)
Civil engineer, contracting [facility]) Hospital Liaison note.      Received request from [facility] manager for family interest in [facility]. Patient information has been forwarded to [facility] for review.  Eligibility confirmed.     [facility] is unable to offer a room today. Family has agreed to [facility] [place] instead, as they do have availability today. Liaison will notify TOC once consents are complete to arrange transport. Hospice Home would like for the patient to come on evening shift after 6PM.    RN please call report to [phone].    Please do not hesitate to call with questions.      Thank you,      Elsie Saas, RN, [facility]         [facility] Liaison      [phone]

## 2021-07-10 NOTE — Progress Notes (Signed)
Civil engineer, contracting [facility]) Hospital Liaison note.      Family has decided they prefer to wait and see if [facility] will be able to offer a bed tomorrow instead of transferring to [facility] [place].   Hospital Liaison will follow up tomorrow.    Please do not hesitate to call with questions.      Thank you,      Elsie Saas, RN, [facility]         [facility] Liaison      [phone]

## 2021-07-10 NOTE — Progress Notes (Signed)
Civil engineer, contracting [facility]) Hospital Liaison note.      Received request from [facility] manager for family [facility]. Patient information has been forwarded to [facility] for review.  Eligibility confirmed.    [facility] is unable to offer a room today. Hospital Liaison will follow up tomorrow or sooner if a room becomes available.   Please do not hesitate to call with questions.      Thank you,      Elsie Saas, RN, [facility]         [facility] Liaison      [phone]

## 2021-07-11 DIAGNOSIS — G9341 Metabolic encephalopathy: Secondary | ICD-10-CM

## 2021-07-11 DIAGNOSIS — G931 Anoxic brain damage, not elsewhere classified: Secondary | ICD-10-CM

## 2021-07-11 DIAGNOSIS — F03918 Unspecified dementia, unspecified severity, with other behavioral disturbance: Secondary | ICD-10-CM

## 2021-07-11 DIAGNOSIS — J9601 Acute respiratory failure with hypoxia: Secondary | ICD-10-CM | POA: Diagnosis not present

## 2021-07-11 DIAGNOSIS — J690 Pneumonitis due to inhalation of food and vomit: Secondary | ICD-10-CM

## 2021-07-11 DIAGNOSIS — Z515 Encounter for palliative care: Secondary | ICD-10-CM

## 2021-07-11 DIAGNOSIS — I469 Cardiac arrest, cause unspecified: Secondary | ICD-10-CM

## 2021-07-11 DIAGNOSIS — N139 Obstructive and reflux uropathy, unspecified: Secondary | ICD-10-CM

## 2021-07-11 DIAGNOSIS — I10 Essential (primary) hypertension: Secondary | ICD-10-CM

## 2021-07-11 DIAGNOSIS — Z66 Do not resuscitate: Secondary | ICD-10-CM

## 2021-07-11 DIAGNOSIS — E876 Hypokalemia: Secondary | ICD-10-CM

## 2021-07-11 DIAGNOSIS — N179 Acute kidney failure, unspecified: Secondary | ICD-10-CM | POA: Diagnosis not present

## 2021-07-18 DIAGNOSIS — F03918 Unspecified dementia, unspecified severity, with other behavioral disturbance: Secondary | ICD-10-CM

## 2021-07-18 DIAGNOSIS — M810 Age-related osteoporosis without current pathological fracture: Secondary | ICD-10-CM

## 2021-07-18 DIAGNOSIS — E039 Hypothyroidism, unspecified: Secondary | ICD-10-CM

## 2021-07-18 DIAGNOSIS — E785 Hyperlipidemia, unspecified: Secondary | ICD-10-CM

## 2021-07-19 ENCOUNTER — Telehealth: Payer: Medicare Other

## 2021-07-19 NOTE — Progress Notes (Signed)
  PROGRESS NOTE  Patient to transfer to [facility] for residential hospice once bed available. Met with family at bedside. Patient was comfortably sleeping during visit.    Dessa Phi, DO [facility] 07-26-21, 9:06 AM  Available via Epic secure chat 7am-7pm After these hours, please refer to coverage provider listed on [email or web address]

## 2021-07-19 NOTE — Death Summary Note (Addendum)
DEATH SUMMARY   Patient Details  Name: Donna Patrick MRN: [id number] DOB: 21-Dec-1950  Admission/Discharge Information   Admit Date:  07-20-21  Date of Death: Date of Death: August 03, 2021  Time of Death: Time of Death: 18-Dec-1114  Length of Stay: 24-Dec-2022  Referring Physician: Wynn Banker, MD   Reason(s) for Hospitalization  Cardiac arrest   Diagnoses  Preliminary cause of death: Cardiac arrest  Secondary Diagnoses (including complications and co-morbidities):  Principal Problem:   Cardiac arrest [facility]) Active Problems:   Hyperlipidemia   Dementia with behavioral disturbance   Hypothyroid   Pressure injury of skin   Malnutrition of moderate degree   Acute hypoxemic respiratory failure (HCC)   AKI (acute kidney injury) (HCC)   PEA (Pulseless electrical activity) (HCC)   Acute metabolic encephalopathy   Anoxic brain injury (HCC)   Aspiration pneumonia (HCC)   HTN (hypertension)   Obstructive uropathy   Hypokalemia   DNR (do not resuscitate)   Comfort measures only status Hypophosphatemia    Brief Hospital Course (including significant findings, care, treatment, and services provided and events leading to death)  Donna Patrick is a 69 y.o. year old female with past medical history significant for moderate dementia, recent loss of her daughter 3 weeks prior to admission,  patient became unresponsive Jul 20, 2021, she sustained a cardiac arrest, wide PEA.  Patient received CPR for 15 minutes and 1 round of epi.  She ultimately had ROSC.  On arrival to the ED she had a spontaneous breathing but minimal neurological function and was intubated.  CT head was negative for acute process.   Hospital events:  07/21/2023; admitted for cardiac arrest, PEA arrest unclear etiology, intubated in the ED 07-21-2023; EEG concerning severe diffuse encephalopathy 11/11; continuous EEG suggestive of moderate to severe diffuse encephalopathy, MRI without acute abnormality.  Goals of care discussion with plan  for one-way extubation and transition to DNR on extubation. 11/12; extubated   Patient continue to have poor oral intake, her mental status didn't improve. Family discussed with palliative care, trajectory of diseases and patient condition. Patient not eating, per prior patient wishes, she wouldn't want artificial nutrition. Patient was transitioned to comfort care.    Pertinent Labs and Studies  Significant Diagnostic Studies DG Abd 1 View  Result Date: 06/29/2021 CLINICAL DATA:  Emesis EXAM: ABDOMEN - 1 VIEW COMPARISON:  05/28/2021 FINDINGS: Gastric tube tip and side port are below the GE junction. Nonobstructive bowel gas pattern. Supine technique limits evaluation for intraperitoneal free air. S shaped curvature of the thoracolumbar spine. No acute osseous abnormality. IMPRESSION: Nonobstructive bowel gas pattern. Electronically Signed   By: Wiliam Ke M.D.   On: 06/29/2021 13:21   DG Abd 1 View  Result Date: 06/28/2021 CLINICAL DATA:  OG tube placement EXAM: ABDOMEN - 1 VIEW COMPARISON:  None. FINDINGS: NG tube within stomach. Side port below the GE junction. Nonobstructive bowel gas pattern IMPRESSION: IMPRESSION NG tube in stomach Electronically Signed   By: Genevive Bi M.D.   On: 06/28/2021 10:11   CT HEAD WO CONTRAST ( )  Result Date: 07/04/2021 CLINICAL DATA:  70 year old female with history of delirium. EXAM: CT HEAD WITHOUT CONTRAST TECHNIQUE: Contiguous axial images were obtained from the base of the skull through the vertex without intravenous contrast. COMPARISON:  Head CT 07/20/2021. FINDINGS: Brain: Moderate cerebral atrophy with ex vacuo dilatation of the ventricular system. Patchy and confluent areas of decreased attenuation are noted throughout the deep and periventricular white matter of the cerebral hemispheres  bilaterally, compatible with chronic microvascular ischemic disease. No evidence of acute infarction, hemorrhage, hydrocephalus, extra-axial collection or  mass lesion/mass effect. Vascular: No hyperdense vessel or unexpected calcification. Skull: Normal. Negative for fracture or focal lesion. Sinuses/Orbits: No acute finding. Other: None. IMPRESSION: 1. No acute intracranial abnormalities. 2. Moderate cerebral atrophy with extensive chronic microvascular ischemic changes in the cerebral white matter, as above. Electronically Signed   By: Vinnie Langton M.D.   On: 07/04/2021 11:25   CT HEAD WO CONTRAST (5MM)  Result Date: 07/02/2021 CLINICAL DATA:  Mental status change, unknown cause EXAM: CT HEAD WITHOUT CONTRAST TECHNIQUE: Contiguous axial images were obtained from the base of the skull through the vertex without intravenous contrast. COMPARISON:  06/17/2021 FINDINGS: Brain: No evidence of acute infarction, hemorrhage, hydrocephalus, extra-axial collection or mass lesion/mass effect. Moderate low-density changes within the periventricular and subcortical white matter compatible with chronic microvascular ischemic change. Mild diffuse cerebral volume loss. Vascular: No hyperdense vessel or unexpected calcification. Skull: Normal. Negative for fracture or focal lesion. Sinuses/Orbits: No acute finding. Other: Partially visualized tubing within the posterior oropharynx, likely orogastric tube. IMPRESSION: 1. No acute intracranial findings. 2. Chronic microvascular ischemic change and cerebral volume loss. Electronically Signed   By: Davina Poke D.O.   On: 07/13/2021 11:39   CT Head Wo Contrast  Result Date: 06/17/2021 CLINICAL DATA:  Altered mental status. EXAM: CT HEAD WITHOUT CONTRAST TECHNIQUE: Contiguous axial images were obtained from the base of the skull through the vertex without intravenous contrast. COMPARISON:  05/19/2021 FINDINGS: Brain: No evidence of acute infarction, hemorrhage, extra-axial collection, ventriculomegaly, or mass effect. Generalized cerebral atrophy. Periventricular white matter low attenuation likely secondary to  microangiopathy. Vascular: No hyperdense vessel or unexpected calcifications. Skull: Negative for fracture or focal lesion. Sinuses/Orbits: Visualized portions of the orbits are unremarkable. Visualized portions of the paranasal sinuses are unremarkable. Visualized portions of the mastoid air cells are unremarkable. Other: None. IMPRESSION: 1. No acute intracranial pathology. 2. Chronic microvascular disease and cerebral atrophy. Electronically Signed   By: Kathreen Devoid M.D.   On: 06/17/2021 17:01   MR BRAIN WO CONTRAST  Result Date: 06/29/2021 CLINICAL DATA:  Anoxic brain injury.  Cardiac arrest at home EXAM: MRI HEAD WITHOUT CONTRAST TECHNIQUE: Multiplanar, multiecho pulse sequences of the brain and surrounding structures were obtained without intravenous contrast. COMPARISON:  CT head 07/10/2021 FINDINGS: Brain: Negative for acute infarct.  No areas of restricted diffusion Moderate atrophy. Atrophy most prominent in the frontal and temporal lobes. Ventricular enlargement consistent with atrophy. Periventricular deep white matter hyperintensity diffusely may represent chronic ischemia. No hemorrhage or mass identified. Vascular: Normal arterial flow voids Skull and upper cervical spine: No focal skeletal lesion. Sinuses/Orbits: Mild mucosal edema paranasal sinuses. Negative orbit Other: None IMPRESSION: Negative for acute infarct. There is moderate atrophy. Periventricular white matter hyperintensity may represent chronic ischemia. Electronically Signed   By: Franchot Gallo M.D.   On: 06/29/2021 15:14   US Abdomen Complete  Result Date: 06/28/2021 CLINICAL DATA:  Acute kidney injury, elevated LFTs, cardiac arrest with unclear etiology EXAM: ABDOMEN ULTRASOUND COMPLETE COMPARISON:  None. FINDINGS: Gallbladder: Layering gallbladder sludge. No gallstones or gallbladder wall thickening. Common bile duct: Diameter: 3 mm Liver: No focal lesion identified. Within normal limits in parenchymal echogenicity.  Portal vein is patent on color Doppler imaging with normal direction of blood flow towards the liver. IVC: No abnormality visualized. Pancreas: Visualized portion unremarkable. Spleen: Size and appearance within normal limits. Right Kidney: Length: 9.0 cm. Echogenicity within normal limits.  No mass or hydronephrosis visualized. Left Kidney: Length: 8.8 cm. Echogenicity within normal limits. No mass or hydronephrosis visualized. Abdominal aorta: No aneurysm visualized. Other findings: None. IMPRESSION: Layering gallbladder sludge, without associated sonographic findings to suggest acute cholecystitis. Otherwise negative abdominal ultrasound. Electronically Signed   By: Julian Hy M.D.   On: 06/28/2021 00:11   DG CHEST PORT 1 VIEW  Result Date: 06/28/2021 CLINICAL DATA:  Intubated EXAM: PORTABLE CHEST 1 VIEW COMPARISON:  06/19/2021 FINDINGS: Endotracheal tube tip is about 4.4 cm superior to carina. Esophageal tube tip below the diaphragm but incompletely visualized. Clear lung fields. Normal cardiomediastinal silhouette. No pneumothorax. IMPRESSION: 1. Endotracheal tube tip about 4.4 cm superior to carina. 2. Clear lung fields Electronically Signed   By: Donavan Foil M.D.   On: 06/28/2021 19:16   DG Chest Portable 1 View  Result Date: 06/20/2021 CLINICAL DATA:  Cardiac arrest, status post CPR EXAM: PORTABLE CHEST 1 VIEW COMPARISON:  05/19/2021 FINDINGS: Endotracheal tube terminates 11 mm above the carina. Nasogastric tube terminates at the body of the stomach with side port below the gastroesophageal junction. External pacer/defibrillator. Normal heart size. Atherosclerosis in the transverse aorta. No pleural effusion or pneumothorax. Perihilar and slightly upper lobe predominant interstitial and airspace disease centrally. IMPRESSION: Interstitial and airspace disease is mild, suspicious for pulmonary edema. Early infection or aspiration could look similar. Endotracheal tube tip 11 mm above the  carina. Consider retraction 3-4 cm. Aortic Atherosclerosis (ICD10-I70.0). Electronically Signed   By: Abigail Miyamoto M.D.   On: 06/19/2021 10:44   EEG adult  Result Date: 07/10/2021 Lora Havens, MD     06/30/2021  8:43 PM Patient Name: LAKYN NEUBECK MRN: [id number] Epilepsy Attending: Lora Havens Referring Physician/Provider: Omar Person, NP Date: 06/29/2021 Duration: 22.32 mins Patient history: 70 year old female who presented to the emergency department status post PEA cardiac arrest after she became unresponsive in the bathroom, witnessed by nephew, who started CPR, ROSC was achieved after 15 minutes, she received 1 amp of epi. EEG to evaluate for seizure Level of alertness:comatose AEDs during EEG study: LEV, propofol Technical aspects: This EEG study was done with scalp electrodes positioned according to the 10-20 International system of electrode placement. Electrical activity was acquired at a sampling rate of 500Hz  and reviewed with a high frequency filter of 70Hz  and a low frequency filter of 1Hz . EEG data were recorded continuously and digitally stored. Description: EEG showed continuous generalized low amplitude 1-3 Hz delta slowing. EEG was reactive to noxious stimulation and showed 5-6 Hz theta  slowing. Hyperventilation and photic stimulation were not performed.   ABNORMALITY - Continuous slow, generalized IMPRESSION: This study is suggestive of severe diffuse encephalopathy, nonspecific etiology but likely related to sedation, anoxic/hypoxic brain injury. No seizures or epileptiform discharges were seen throughout the recording. Priyanka Barbra Sarks   Overnight EEG with video  Result Date: 06/28/2021 Lora Havens, MD     06/29/2021  8:47 AM Patient Name: SHANEESE LANFORD MRN: [id number] Epilepsy Attending: Lora Havens Referring Physician/Provider: Omar Person, NP Duration: 06/28/2021 1803 to 06/28/2021 1803  Patient history: 70 year old female who presented to the  emergency department status post PEA cardiac arrest after she became unresponsive in the bathroom, witnessed by nephew, who started CPR, ROSC was achieved after 15 minutes, she received 1 amp of epi. EEG to evaluate for seizure  Level of alertness:comatose  AEDs during EEG study: LEV  Technical aspects: This EEG study was done with scalp electrodes positioned according  to the 10-20 International system of electrode placement. Electrical activity was acquired at a sampling rate of 500Hz  and reviewed with a high frequency filter of 70Hz  and a low frequency filter of 1Hz . EEG data were recorded continuously and digitally stored.  Description: EEG showed near continuous generalized 1-3 Hz delta slowing which at times appears sharply contoured and rhythmic. Intermittent 5-9 Hz theta-alpha activity. Hyperventilation and photic stimulation were not performed.    ABNORMALITY - Continuous slow, generalized  IMPRESSION: This study is suggestive of moderate to severe diffuse encephalopathy, nonspecific etiology but could be secondary to sedation, anoxic/hypoxic brain injury. No seizures or definite epileptiform discharges were seen throughout the recording.  Lora Havens   ECHOCARDIOGRAM COMPLETE  Result Date: 07/03/2021    ECHOCARDIOGRAM REPORT   Patient Name:   PAULENA KIANG Date of Exam: 06/25/2021 Medical Rec #:  [id number]       Height:       62.0 in Accession #:    [id number]      Weight:       145.5 lb Date of Birth:  Jan 22, 1951        BSA:          1.670 m Patient Age:    63 years        BP:           89/37 mmHg Patient Gender: F               HR:           101 bpm. Exam Location:  Inpatient Procedure: 2D Echo, Cardiac Doppler and Color Doppler Indications:    Cardiac Arrest  History:        Patient has no prior history of Echocardiogram examinations.  Sonographer:    Merrie Roof RDCS Referring Phys: [place]  1. Left ventricular ejection fraction, by estimation, is 55 to 60%. The  left ventricle has normal function. The left ventricle has no regional wall motion abnormalities. Left ventricular diastolic parameters are consistent with Grade I diastolic dysfunction (impaired relaxation).  2. Right ventricular systolic function is normal. The right ventricular size is normal. There is moderately elevated pulmonary artery systolic pressure. The estimated right ventricular systolic pressure is 0000000 mmHg.  3. The mitral valve is normal in structure. No evidence of mitral valve regurgitation. No evidence of mitral stenosis.  4. The aortic valve is tricuspid. Aortic valve regurgitation is not visualized. No aortic stenosis is present.  5. The inferior vena cava is dilated in size with <50% respiratory variability, suggesting right atrial pressure of 15 mmHg.  6. A small pericardial effusion is present. FINDINGS  Left Ventricle: Left ventricular ejection fraction, by estimation, is 55 to 60%. The left ventricle has normal function. The left ventricle has no regional wall motion abnormalities. The left ventricular internal cavity size was normal in size. There is  no left ventricular hypertrophy. Left ventricular diastolic parameters are consistent with Grade I diastolic dysfunction (impaired relaxation). Right Ventricle: The right ventricular size is normal. No increase in right ventricular wall thickness. Right ventricular systolic function is normal. There is moderately elevated pulmonary artery systolic pressure. The tricuspid regurgitant velocity is 3.06 m/s, and with an assumed right atrial pressure of 15 mmHg, the estimated right ventricular systolic pressure is 0000000 mmHg. Left Atrium: Left atrial size was normal in size. Right Atrium: Right atrial size was normal in size. Pericardium: A small pericardial effusion is present. Mitral Valve: The mitral valve is  normal in structure. No evidence of mitral valve regurgitation. No evidence of mitral valve stenosis. Tricuspid Valve: The tricuspid valve  is normal in structure. Tricuspid valve regurgitation is not demonstrated. Aortic Valve: The aortic valve is tricuspid. Aortic valve regurgitation is not visualized. No aortic stenosis is present. Aortic valve mean gradient measures 4.0 mmHg. Aortic valve peak gradient measures 6.7 mmHg. Aortic valve area, by VTI measures 2.18 cm. Pulmonic Valve: The pulmonic valve was normal in structure. Pulmonic valve regurgitation is trivial. Aorta: The aortic root is normal in size and structure. Venous: The inferior vena cava is dilated in size with less than 50% respiratory variability, suggesting right atrial pressure of 15 mmHg. IAS/Shunts: No atrial level shunt detected by color flow Doppler.  LEFT VENTRICLE PLAX 2D LVIDd:         3.80 cm   Diastology LVIDs:         2.30 cm   LV e' medial:    6.53 cm/s LV PW:         0.90 cm   LV E/e' medial:  10.1 LV IVS:        0.90 cm   LV e' lateral:   6.31 cm/s LVOT diam:     2.10 cm   LV E/e' lateral: 10.5 LV SV:         43 LV SV Index:   26 LVOT Area:     3.46 cm  RIGHT VENTRICLE             IVC RV Basal diam:  3.60 cm     IVC diam: 2.40 cm RV S prime:     14.10 cm/s TAPSE (M-mode): 1.3 cm LEFT ATRIUM           Index        RIGHT ATRIUM          Index LA diam:      2.30 cm 1.38 cm/m   RA Area:     9.72 cm LA Vol (A4C): 19.8 ml 11.86 ml/m  RA Volume:   18.90 ml 11.32 ml/m  AORTIC VALVE AV Area (Vmax):    2.37 cm AV Area (Vmean):   2.29 cm AV Area (VTI):     2.18 cm AV Vmax:           129.00 cm/s AV Vmean:          86.100 cm/s AV VTI:            0.197 m AV Peak Grad:      6.7 mmHg AV Mean Grad:      4.0 mmHg LVOT Vmax:         88.30 cm/s LVOT Vmean:        57.000 cm/s LVOT VTI:          0.124 m LVOT/AV VTI ratio: 0.63  AORTA Ao Root diam: 2.50 cm Ao Asc diam:  3.20 cm MITRAL VALVE               TRICUSPID VALVE MV Area (PHT): 4.63 cm    TR Peak grad:   37.5 mmHg MV Decel Time: 164 msec    TR Vmax:        306.00 cm/s MV E velocity: 66.00 cm/s MV A velocity: 96.80 cm/s  SHUNTS  MV E/A ratio:  0.68        Systemic VTI:  0.12 m  Systemic Diam: 2.10 cm Dalton McleanMD Electronically signed by Franki Monte Signature Date/Time: 07/10/2021/7:10:43 PM    Final    VAS [place] LOWER EXTREMITY VENOUS (DVT)  Result Date: 06/28/2021  Lower Venous DVT Study Patient Name:  RAELANI NIWA  Date of Exam:   07/13/2021 Medical Rec #: [id number]        Accession #:    [id number] Date of Birth: [birth year]         Patient Gender: F Patient Age:   25 years Exam Location[facility] Procedure:      VAS [place] LOWER EXTREMITY VENOUS (DVT) Referring Phys: Hayden Pedro --------------------------------------------------------------------------------  Indications: Edema.  Comparison Study: no prior Performing Technologist: Archie Patten RVS  Examination Guidelines: A complete evaluation includes B-mode imaging, spectral Doppler, color Doppler, and power Doppler as needed of all accessible portions of each vessel. Bilateral testing is considered an integral part of a complete examination. Limited examinations for reoccurring indications may be performed as noted. The reflux portion of the exam is performed with the patient in reverse Trendelenburg.  +---------+---------------+---------+-----------+----------+--------------+ RIGHT    CompressibilityPhasicitySpontaneityPropertiesThrombus Aging +---------+---------------+---------+-----------+----------+--------------+ CFV      Full           Yes      No                                  +---------+---------------+---------+-----------+----------+--------------+ SFJ      Full                                                        +---------+---------------+---------+-----------+----------+--------------+ FV Prox  Full                                                        +---------+---------------+---------+-----------+----------+--------------+ FV Mid   Full                                                         +---------+---------------+---------+-----------+----------+--------------+ FV DistalFull                                                        +---------+---------------+---------+-----------+----------+--------------+ PFV      Full                                                        +---------+---------------+---------+-----------+----------+--------------+ POP      Full           Yes      Yes                                 +---------+---------------+---------+-----------+----------+--------------+  PTV      Full                                                        +---------+---------------+---------+-----------+----------+--------------+ PERO     Full                                                        +---------+---------------+---------+-----------+----------+--------------+   +---------+---------------+---------+-----------+----------+--------------+ LEFT     CompressibilityPhasicitySpontaneityPropertiesThrombus Aging +---------+---------------+---------+-----------+----------+--------------+ CFV      Full           Yes      Yes                                 +---------+---------------+---------+-----------+----------+--------------+ SFJ      Full                                                        +---------+---------------+---------+-----------+----------+--------------+ FV Prox  Full                                                        +---------+---------------+---------+-----------+----------+--------------+ FV Mid   Full                                                        +---------+---------------+---------+-----------+----------+--------------+ FV DistalFull                                                        +---------+---------------+---------+-----------+----------+--------------+ PFV      Full                                                         +---------+---------------+---------+-----------+----------+--------------+ POP      Full           Yes      Yes                                 +---------+---------------+---------+-----------+----------+--------------+ PTV      Full                                                        +---------+---------------+---------+-----------+----------+--------------+  PERO     Full                                                        +---------+---------------+---------+-----------+----------+--------------+     Summary: BILATERAL: - No evidence of deep vein thrombosis seen in the lower extremities, bilaterally. -No evidence of popliteal cyst, bilaterally.   *See table(s) above for measurements and observations. Electronically signed by Monica Martinez MD on 06/28/2021 at 3:27:15 PM.    Final     Microbiology Recent Results (from the past 240 hour(s))  SARS CORONAVIRUS 2 (TAT 6-24 HRS) Nasopharyngeal Nasopharyngeal Swab     Status: None   Collection Time: 07/03/21  4:43 PM   Specimen: Nasopharyngeal Swab  Result Value Ref Range Status   SARS Coronavirus 2 NEGATIVE NEGATIVE Final    Comment: (NOTE) SARS-CoV-2 target nucleic acids are NOT DETECTED.  The SARS-CoV-2 RNA is generally detectable in upper and lower respiratory specimens during the acute phase of infection. Negative results do not preclude SARS-CoV-2 infection, do not rule out co-infections with other pathogens, and should not be used as the sole basis for treatment or other patient management decisions. Negative results must be combined with clinical observations, patient history, and epidemiological information. The expected result is Negative.  Fact Sheet for Patients: SugarRoll.be  Fact Sheet for Healthcare Providers: https://www.woods-mathews.com/  This test is not yet approved or cleared by the [place] FDA and  has been authorized for detection and/or  diagnosis of SARS-CoV-2 by FDA under an Emergency Use Authorization (EUA). This EUA will remain  in effect (meaning this test can be used) for the duration of the COVID-19 declaration under Se ction 564(b)(1) of the Act, 21 U.S.C. section 360bbb-3(b)(1), unless the authorization is terminated or revoked sooner.  Performed at [facility], [place] [address], [place], [place] [address]     Lab Basic Metabolic Panel: Recent Labs  Lab 07/05/21 0115 07/05/21 1056 07/08/21 0115  NA 136 136 135  K 3.8 4.7 3.5  CL 102  --  99  CO2 26  --  29  GLUCOSE 115*  --  104*  BUN 12  --  7*  CREATININE 0.66  --  0.75  CALCIUM 8.9  --  8.9  MG 2.2  --   --    Liver Function Tests: No results for input(s): AST, ALT, ALKPHOS, BILITOT, PROT, ALBUMIN in the last 168 hours. No results for input(s): LIPASE, AMYLASE in the last 168 hours. No results for input(s): AMMONIA in the last 168 hours. CBC: Recent Labs  Lab 07/05/21 1056 07/05/21 1217  WBC  --  10.4  HGB 12.2 12.5  HCT 36.0 37.1  MCV  --  89.0  PLT  --  340   Cardiac Enzymes: No results for input(s): CKTOTAL, CKMB, CKMBINDEX, TROPONINI in the last 168 hours. Sepsis Labs: Recent Labs  Lab 07/05/21 1217  WBC 10.4     Dessa Phi 08/03/2021, 12:54 PM

## 2021-07-19 NOTE — Progress Notes (Signed)
Daily Progress Note   Patient Name: Donna Patrick       Date: 07/03/2021 DOB: August 03, 1951  Age: 70 y.o. MRN#: [id number] Attending Physician: Noralee Stain, DO Primary Care Physician: Wynn Banker, MD Admit Date: 07-10-2021  Reason for Consultation/Follow-up: end of life care, symptom management  Subjective: Patient appears comfortable. Morphine infusion is at 2 mg/hr. Unresponsive to voice and light touch. No non-verbal signs of pain or discomfort noted. Respirations are even and unlabored. No excessive respiratory secretions noted.   Family (son and daughter-in-law) present at bedside. Education and counseling provided on natural trajectory at EOL. Created space and opportunity for family to share stories about patient's life as well as express their grief over her at EOL. Emotional support provided   I am joined in the room by hospice liaison Wallis Bamberg. [facility] does have a bed today; however family is concerned that patient may pass during transport so they decline the bed offer.      Length of Stay: 14  Current Medications: Scheduled Meds:   antiseptic oral rinse  15 mL Topical BID   LORazepam  1-2 mg Intravenous Q6H    Continuous Infusions:  morphine 2 mg/hr (07/10/21 1426)    PRN Meds: acetaminophen **OR** acetaminophen, glycopyrrolate **OR** glycopyrrolate **OR** glycopyrrolate, haloperidol **OR** haloperidol **OR** haloperidol lactate, hydrALAZINE, LORazepam **OR** LORazepam **OR** LORazepam, morphine injection, morphine, ondansetron **OR** ondansetron (ZOFRAN) IV, polyvinyl alcohol           Vital Signs: BP (!) 91/56 (BP Location: Right Arm)   Pulse (!) 128   Temp 98.9 F (37.2 C) (Oral)   Resp 13   Ht 5\' 2"  (1.575 m)   Wt 54.9 kg   SpO2 (!) 88%    BMI 22.14 kg/m  SpO2: SpO2: (!) 88 % O2 Device: O2 Device: Room Air O2 Flow Rate: O2 Flow Rate (L/min): 2 L/min       Palliative Assessment/Data: PPS 10%       Palliative Care Assessment & Plan   Patient Profile: 70 y.o. female  with past medical history of dementia presented to ED on Jul 10, 2021 from home after witnessed cardiac arrest. CPR was initiated by family and patient was found in PEA on EMS arrival. 15 minutes of CPR was performed before ROSC; however, on arrival to ED there was minimal  neurological functioning and she was intubated. Patient was admitted on 2021-07-26 with PEA cardiac arrest with unclear etiology, respiratory insufficiency in setting of cardiac arrest, encephalopathy, AKI. Patient was extubated 06/30/21.   Assessment: Cardiac arrest Pressure injury of skin Malnutrition of moderate degree Acute hypoxic respiratory failure Dementia Terminal care   Recommendations/Plan: Continue full comfort measures Continue morphine infusion  Family declines bed at [facility] - anticipate hospital death   Goals of Care and Additional Recommendations: Limitations on Scope of Treatment: Full Comfort Care  Code Status: DNR/DNI  Prognosis:  Hours - Days  Discharge Planning: Anticipated Hospital Death    Thank you for allowing the Palliative Medicine Team to assist in the care of this patient.   Total Time 25 minutes Prolonged Time Billed  no       Greater than 50%  of this time was spent counseling and coordinating care related to the above assessment and plan.  Merry Proud, NP  Please contact Palliative Medicine Team phone at [phone] for questions and concerns.

## 2021-07-19 DEATH — deceased

## 2021-07-23 LAB — GLUCOSE, CAPILLARY
Glucose-Capillary: 119 mg/dL — ABNORMAL HIGH (ref 70–99)
Glucose-Capillary: 102 mg/dL — ABNORMAL HIGH (ref 70–99)

## 2021-08-10 ENCOUNTER — Other Ambulatory Visit: Payer: Self-pay | Admitting: Family Medicine

## 2021-08-15 ENCOUNTER — Ambulatory Visit: Payer: Medicare Other | Admitting: Podiatry

## 2021-09-01 ENCOUNTER — Other Ambulatory Visit: Payer: Self-pay | Admitting: Family Medicine

## 2021-09-12 ENCOUNTER — Other Ambulatory Visit: Payer: Self-pay | Admitting: Family Medicine

## 2022-01-08 ENCOUNTER — Ambulatory Visit: Payer: Medicare Other

## 2022-04-17 ENCOUNTER — Ambulatory Visit: Payer: Medicare Other | Admitting: Family Medicine

## 2023-05-20 IMAGING — CT CT HEAD W/O CM
4 series · 17 of 47 positions shown, 19 images · non-contrast
Comparison: Head CT 06/27/2021.

CLINICAL DATA: 70-year-old female with history of delirium.

EXAM:
CT HEAD WITHOUT CONTRAST
TECHNIQUE: Contiguous axial images were obtained from the base of the skull
through the vertex without intravenous contrast.

[Series 3: head wo · axial · 0.43mm/px · z∈[-90,+30]mm · 7 of 33 slices shown, 9 images]
[im 5/33  brain]
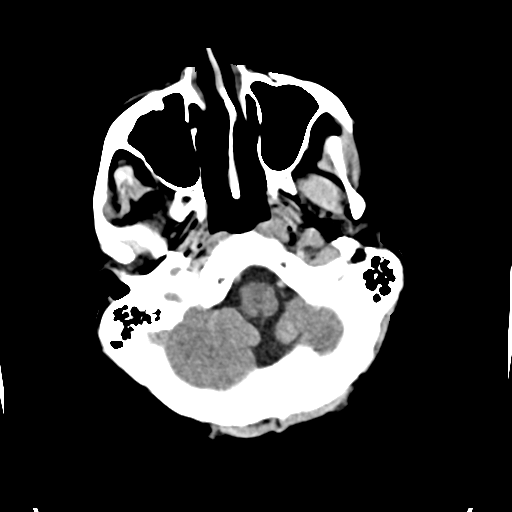
[im 5/33  bone]
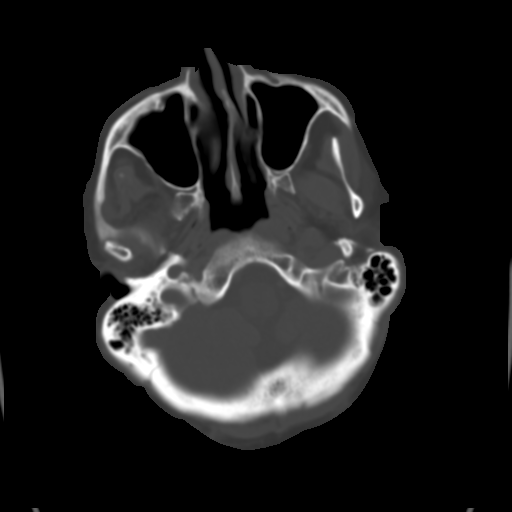
[im 9/33  brain]
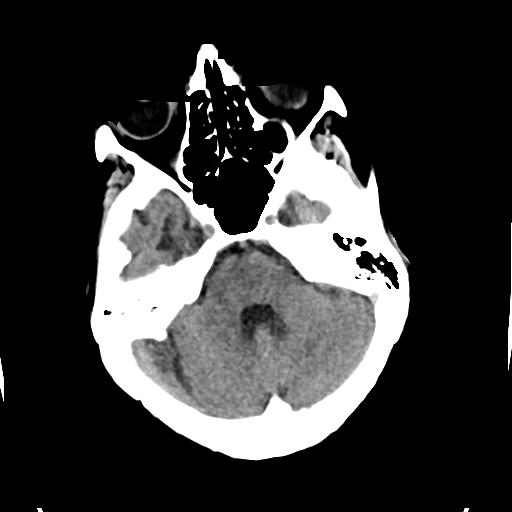
[im 13/33  brain]
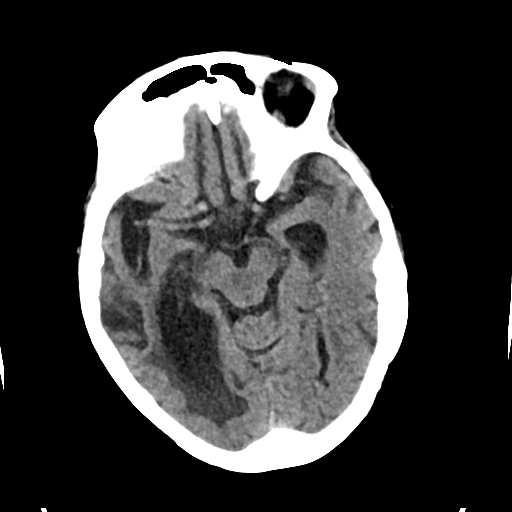
[im 17/33  brain]
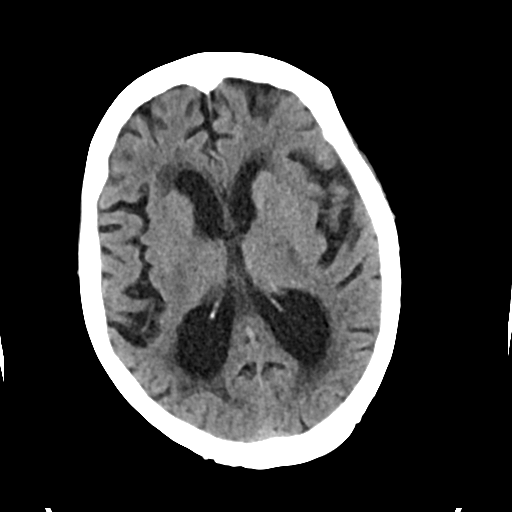
[im 21/33  brain]
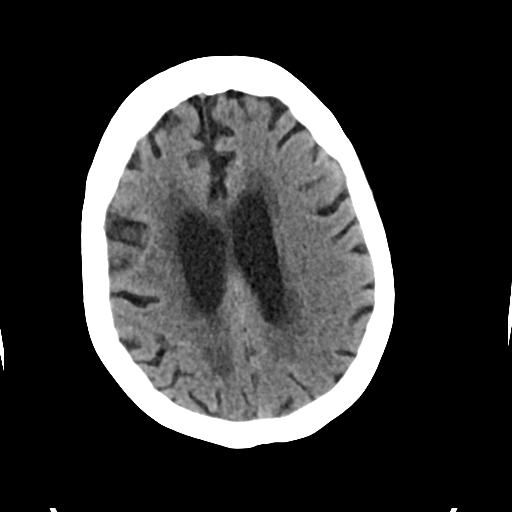
[im 21/33  bone]
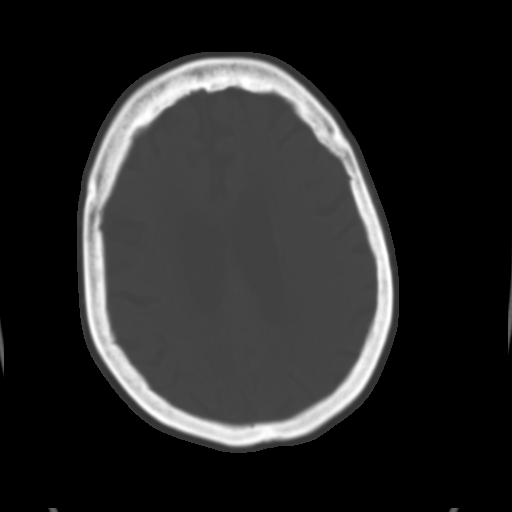
[im 25/33  brain]
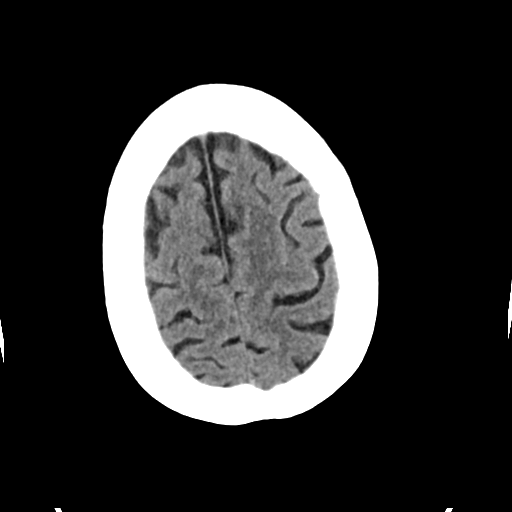
[im 29/33  brain]
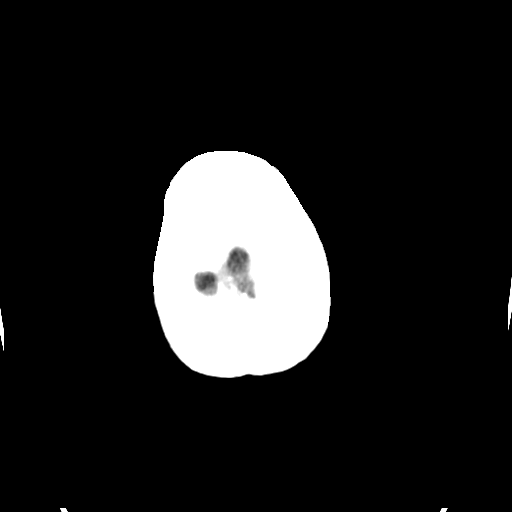

[Series 4: head bone · axial · 0.43mm/px · z∈[-94,-38]mm · 4 of 82 slices shown]
[im 9/82  bone]
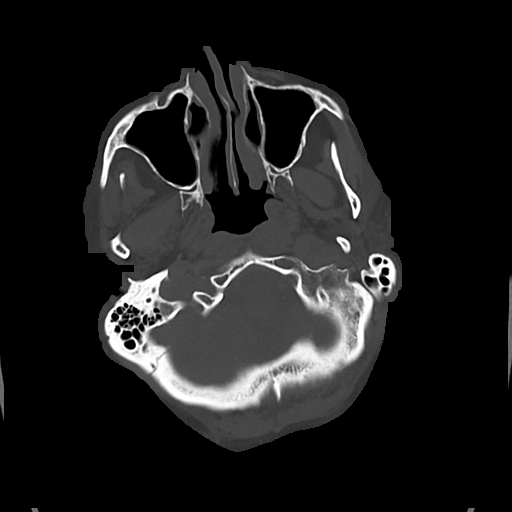
[im 17/82  bone]
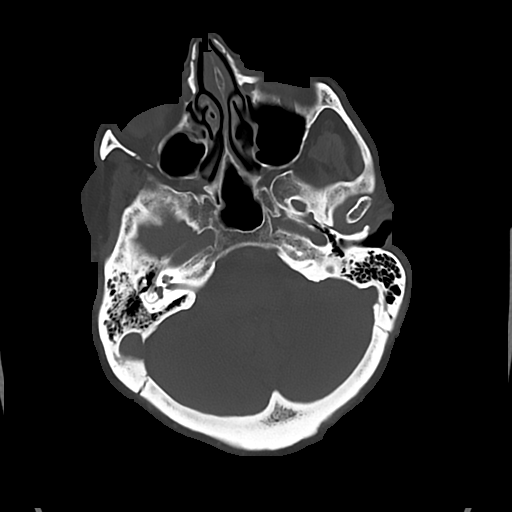
[im 25/82  bone]
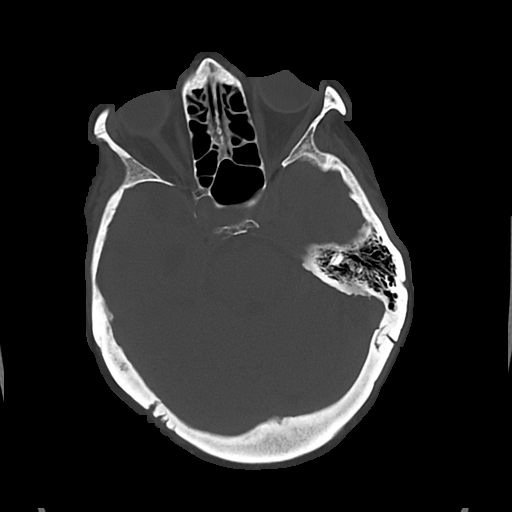
[im 37/82  bone]
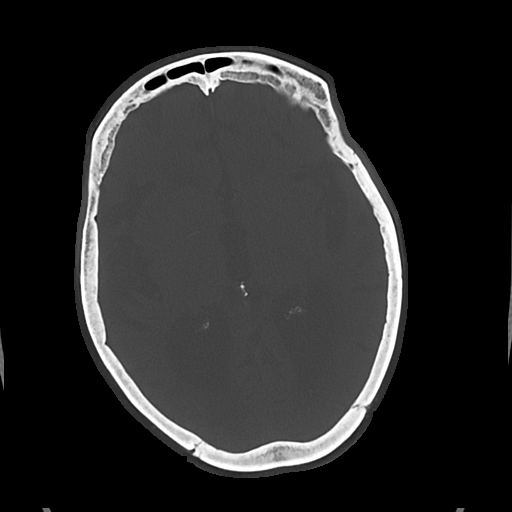

[Series 5: cor soft · coronal · 0.35mm/px · 3 of 65 slices shown]
[im 22/65  brain]
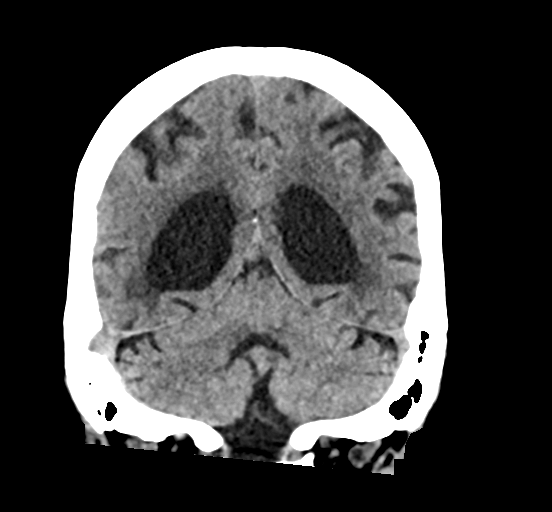
[im 29/65  brain]
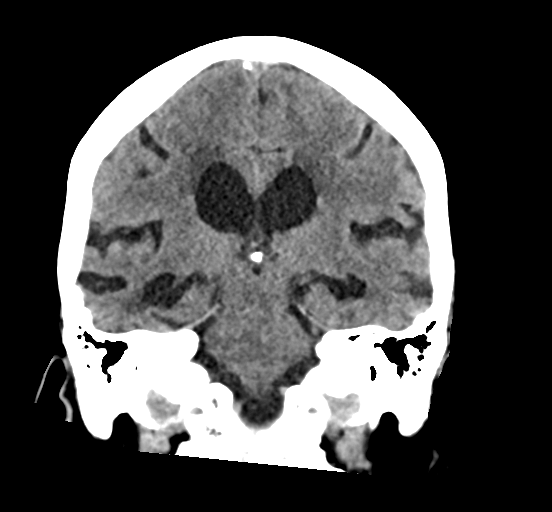
[im 36/65  brain]
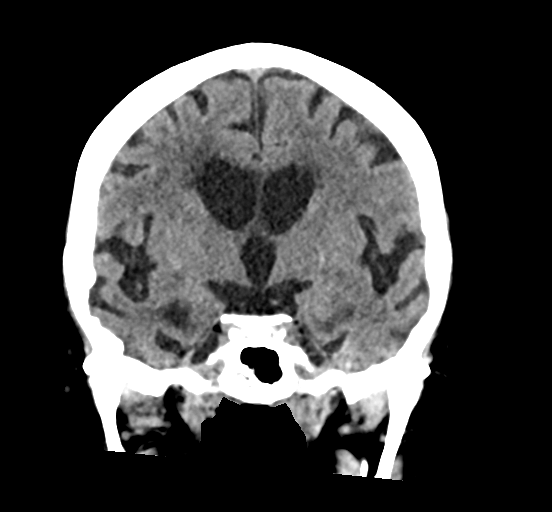

[Series 6: sag soft · sagittal · 0.34mm/px · 3 of 51 slices shown]
[im 17/51  brain]
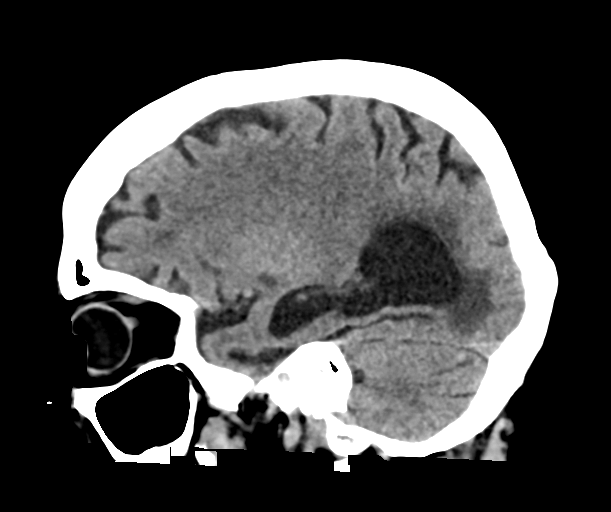
[im 26/51  brain]
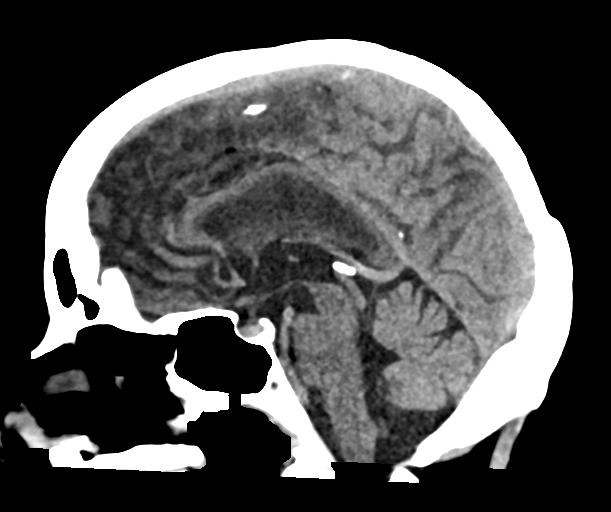
[im 34/51  brain]
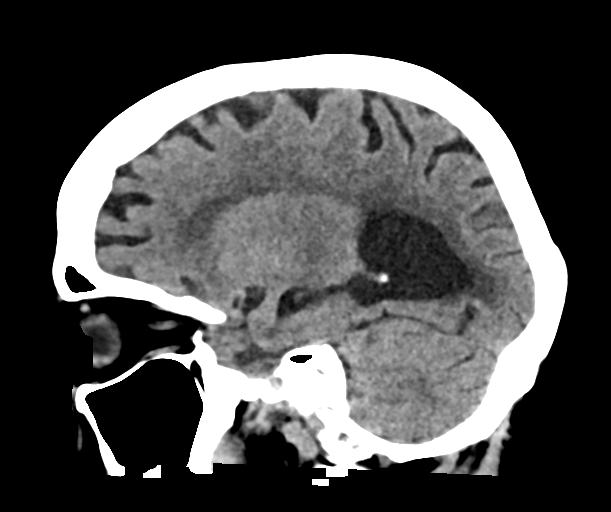

[17 of 47 positions shown; findings below may reference images not displayed]

FINDINGS: Brain: Moderate cerebral atrophy with ex vacuo dilatation of the
ventricular system. Patchy and confluent areas of decreased
attenuation are noted throughout the deep and periventricular white
matter of the cerebral hemispheres bilaterally, compatible with
chronic microvascular ischemic disease. No evidence of acute
infarction, hemorrhage, hydrocephalus, extra-axial collection or
mass lesion/mass effect.

Vascular: No hyperdense vessel or unexpected calcification.

Skull: Normal. Negative for fracture or focal lesion.

Sinuses/Orbits: No acute finding.

Other: None.
IMPRESSION: 1. No acute intracranial abnormalities.
2. Moderate cerebral atrophy with extensive chronic microvascular
ischemic changes in the cerebral white matter, as above.
